# Patient Record
Sex: Female | Born: 1945
Health system: Southern US, Community
[De-identification: ages and names within clinical notes are randomized; demographics above are authoritative.]

## PROBLEM LIST (undated history)

## (undated) DIAGNOSIS — K5090 Crohn's disease, unspecified, without complications: Secondary | ICD-10-CM

## (undated) DIAGNOSIS — C4491 Basal cell carcinoma of skin, unspecified: Secondary | ICD-10-CM

## (undated) DIAGNOSIS — T148XXA Other injury of unspecified body region, initial encounter: Secondary | ICD-10-CM

## (undated) DIAGNOSIS — R229 Localized swelling, mass and lump, unspecified: Principal | ICD-10-CM

## (undated) DIAGNOSIS — I7300 Raynaud's syndrome without gangrene: Secondary | ICD-10-CM

## (undated) DIAGNOSIS — E559 Vitamin D deficiency, unspecified: Secondary | ICD-10-CM

## (undated) DIAGNOSIS — D649 Anemia, unspecified: Secondary | ICD-10-CM

## (undated) DIAGNOSIS — R42 Dizziness and giddiness: Secondary | ICD-10-CM

## (undated) DIAGNOSIS — M719 Bursopathy, unspecified: Secondary | ICD-10-CM

## (undated) DIAGNOSIS — IMO0002 Reserved for concepts with insufficient information to code with codable children: Secondary | ICD-10-CM

## (undated) DIAGNOSIS — A64 Unspecified sexually transmitted disease: Secondary | ICD-10-CM

## (undated) DIAGNOSIS — M8580 Other specified disorders of bone density and structure, unspecified site: Secondary | ICD-10-CM

## (undated) DIAGNOSIS — S4382XA Sprain of other specified parts of left shoulder girdle, initial encounter: Secondary | ICD-10-CM

## (undated) DIAGNOSIS — E041 Nontoxic single thyroid nodule: Secondary | ICD-10-CM

## (undated) DIAGNOSIS — S46812A Strain of other muscles, fascia and tendons at shoulder and upper arm level, left arm, initial encounter: Secondary | ICD-10-CM

## (undated) HISTORY — DX: Other injury of unspecified body region, initial encounter: T14.8XXA

## (undated) HISTORY — DX: Strain of other muscles, fascia and tendons at shoulder and upper arm level, left arm, initial encounter: S46.812A

## (undated) HISTORY — DX: Unspecified sexually transmitted disease: A64

## (undated) HISTORY — DX: Basal cell carcinoma of skin, unspecified: C44.91

## (undated) HISTORY — DX: Vitamin D deficiency, unspecified: E55.9

## (undated) HISTORY — DX: Reserved for concepts with insufficient information to code with codable children: IMO0002

## (undated) HISTORY — DX: Localized swelling, mass and lump, unspecified: R22.9

## (undated) HISTORY — DX: Dizziness and giddiness: R42

## (undated) HISTORY — DX: Sprain of other specified parts of left shoulder girdle, initial encounter: S43.82XA

## (undated) HISTORY — DX: Nontoxic single thyroid nodule: E04.1

## (undated) HISTORY — DX: Anemia, unspecified: D64.9

## (undated) HISTORY — DX: Other specified disorders of bone density and structure, unspecified site: M85.80

## (undated) HISTORY — DX: Crohn's disease, unspecified, without complications: K50.90

## (undated) HISTORY — DX: Bursopathy, unspecified: M71.9

## (undated) HISTORY — PX: BREAST SURGERY: SHX581

---

## 1999-12-17 ENCOUNTER — Encounter: Admission: RE | Admit: 1999-12-17 | Discharge: 1999-12-17 | Payer: Self-pay | Admitting: General Surgery

## 2000-02-06 ENCOUNTER — Encounter: Admission: RE | Admit: 2000-02-06 | Discharge: 2000-02-06 | Payer: Self-pay | Admitting: Family Medicine

## 2000-08-26 ENCOUNTER — Other Ambulatory Visit: Admission: RE | Admit: 2000-08-26 | Discharge: 2000-08-26 | Payer: Self-pay | Admitting: Obstetrics and Gynecology

## 2001-09-07 ENCOUNTER — Other Ambulatory Visit: Admission: RE | Admit: 2001-09-07 | Discharge: 2001-09-07 | Payer: Self-pay | Admitting: Obstetrics and Gynecology

## 2002-12-28 ENCOUNTER — Other Ambulatory Visit: Admission: RE | Admit: 2002-12-28 | Discharge: 2002-12-28 | Payer: Self-pay | Admitting: Obstetrics and Gynecology

## 2003-01-05 ENCOUNTER — Ambulatory Visit (HOSPITAL_COMMUNITY): Admission: RE | Admit: 2003-01-05 | Discharge: 2003-01-05 | Payer: Self-pay | Admitting: *Deleted

## 2003-01-05 ENCOUNTER — Encounter (INDEPENDENT_AMBULATORY_CARE_PROVIDER_SITE_OTHER): Payer: Self-pay

## 2004-01-11 ENCOUNTER — Other Ambulatory Visit: Admission: RE | Admit: 2004-01-11 | Discharge: 2004-01-11 | Payer: Self-pay | Admitting: Obstetrics and Gynecology

## 2006-05-27 ENCOUNTER — Encounter (INDEPENDENT_AMBULATORY_CARE_PROVIDER_SITE_OTHER): Payer: Self-pay | Admitting: *Deleted

## 2006-05-27 ENCOUNTER — Ambulatory Visit (HOSPITAL_COMMUNITY): Admission: RE | Admit: 2006-05-27 | Discharge: 2006-05-27 | Payer: Self-pay | Admitting: *Deleted

## 2008-10-25 ENCOUNTER — Encounter (HOSPITAL_COMMUNITY): Admission: RE | Admit: 2008-10-25 | Discharge: 2009-01-23 | Payer: Self-pay | Admitting: Gastroenterology

## 2009-03-07 ENCOUNTER — Encounter (HOSPITAL_COMMUNITY): Admission: RE | Admit: 2009-03-07 | Discharge: 2009-05-08 | Payer: Self-pay | Admitting: *Deleted

## 2009-03-21 ENCOUNTER — Encounter: Admission: RE | Admit: 2009-03-21 | Discharge: 2009-03-21 | Payer: Self-pay | Admitting: Family Medicine

## 2009-05-01 ENCOUNTER — Encounter: Admission: RE | Admit: 2009-05-01 | Discharge: 2009-05-01 | Payer: Self-pay

## 2009-05-09 ENCOUNTER — Encounter (HOSPITAL_COMMUNITY): Admission: RE | Admit: 2009-05-09 | Discharge: 2009-08-07 | Payer: Self-pay | Admitting: Gastroenterology

## 2009-06-01 ENCOUNTER — Encounter: Admission: RE | Admit: 2009-06-01 | Discharge: 2009-06-01 | Payer: Self-pay | Admitting: Internal Medicine

## 2009-06-14 ENCOUNTER — Other Ambulatory Visit: Admission: RE | Admit: 2009-06-14 | Discharge: 2009-06-14 | Payer: Self-pay | Admitting: Interventional Radiology

## 2009-06-14 ENCOUNTER — Encounter: Admission: RE | Admit: 2009-06-14 | Discharge: 2009-06-14 | Payer: Self-pay | Admitting: Internal Medicine

## 2009-09-26 ENCOUNTER — Encounter (HOSPITAL_COMMUNITY): Admission: RE | Admit: 2009-09-26 | Discharge: 2009-11-28 | Payer: Self-pay | Admitting: Gastroenterology

## 2009-12-05 ENCOUNTER — Encounter (HOSPITAL_COMMUNITY)
Admission: RE | Admit: 2009-12-05 | Discharge: 2010-02-27 | Payer: Self-pay | Source: Home / Self Care | Attending: Gastroenterology | Admitting: Gastroenterology

## 2010-02-18 ENCOUNTER — Encounter: Payer: Self-pay | Admitting: Family Medicine

## 2010-04-26 ENCOUNTER — Encounter (HOSPITAL_COMMUNITY): Payer: BC Managed Care – PPO | Attending: Gastroenterology

## 2010-04-26 DIAGNOSIS — K5090 Crohn's disease, unspecified, without complications: Secondary | ICD-10-CM | POA: Insufficient documentation

## 2010-04-30 ENCOUNTER — Encounter (HOSPITAL_COMMUNITY): Payer: Self-pay

## 2010-05-21 ENCOUNTER — Other Ambulatory Visit: Payer: Self-pay | Admitting: Internal Medicine

## 2010-05-21 DIAGNOSIS — E049 Nontoxic goiter, unspecified: Secondary | ICD-10-CM

## 2010-05-22 ENCOUNTER — Other Ambulatory Visit: Payer: Self-pay | Admitting: Internal Medicine

## 2010-05-22 DIAGNOSIS — E041 Nontoxic single thyroid nodule: Secondary | ICD-10-CM

## 2010-06-08 ENCOUNTER — Other Ambulatory Visit (HOSPITAL_COMMUNITY): Payer: BC Managed Care – PPO

## 2010-06-08 ENCOUNTER — Ambulatory Visit
Admission: RE | Admit: 2010-06-08 | Discharge: 2010-06-08 | Disposition: A | Discharge status: Home / Self Care | Payer: BC Managed Care – PPO | Source: Ambulatory Visit | Attending: Internal Medicine | Admitting: Internal Medicine

## 2010-06-08 DIAGNOSIS — E041 Nontoxic single thyroid nodule: Secondary | ICD-10-CM

## 2010-06-15 NOTE — Op Note (Signed)
NAME:  Cassandra Herring, Cassandra Herring                    ACCOUNT NO.:  [id number]   MEDICAL RECORD NO.:  [id number]                   PATIENT TYPE:  AMB   LOCATION:  ENDO                                 FACILITY:  [facility]   PHYSICIAN:  Waverly Ferrari, M.D.                 DATE OF BIRTH:  11/25/1945   DATE OF PROCEDURE:  DATE OF DISCHARGE:                                 OPERATIVE REPORT   PROCEDURE:  Colonoscopy with biopsy.   INDICATIONS FOR PROCEDURE:  Rectal bleeding.   ANESTHESIA:  Demerol 100,Versed 8 mg.   DESCRIPTION OF PROCEDURE:  With the patient mildly sedated in the left  lateral decubitus position, the Olympus videoscopic colonoscope was inserted  in the rectum and passed under direct vision to the cecum identified by the  ileocecal valve and appendiceal orifice.  We entered into the terminal ileum  which appeared normal but was biopsied.  From this point, the colonoscope  was then slowly withdrawn taking circumferential views of the colonic mucosa  stopping only to take random biopsies along the way until we reached the  rectum which appeared normal in direct and retroflexed view.  The endoscope  was straightened and withdrawn.  The patient's vital signs and pulse  oximeter remained stable. The patient tolerated the procedure well without  apparent complications.   FINDINGS:  Shortened chronically inflamed ahaustral colon without active  inflammation seen currently although the mucosa did appear somewhat  edematous and dull in appearance.   PLAN:  Await biopsy results.  The patient will call me for results and  followup with me in as an outpatient.                                               Waverly Ferrari, M.D.    GMO/MEDQ  D:  01/05/2003  T:  01/05/2003  Job:  [id number]

## 2010-06-15 NOTE — Op Note (Signed)
NAMEGREENLEY, MARTONE          ACCOUNT NO.:  [id number]   MEDICAL RECORD NO.:  [id number]          PATIENT TYPE:  AMB   LOCATION:  ENDO                         FACILITY:  [place]   PHYSICIAN:  Waverly Ferrari, M.D.    DATE OF BIRTH:  [birth year]   DATE OF PROCEDURE:  05/27/2006  DATE OF DISCHARGE:                               OPERATIVE REPORT   PROCEDURE:  Colonoscopy.   INDICATIONS:  Colitis with rectal bleeding.   ANESTHESIA:  Fentanyl 75 mcg, Versed 7 mg.   PROCEDURE:  With the patient mildly sedated in the left lateral  decubitus position the Pentax videoscopic pediatric colonoscope was  inserted in the rectum and passed under direct vision to cecum  identified by ileocecal valve and appendiceal orifice both which were  photographed.  We were able to enter into the terminal ileum but could  not advance past just the opening due to the edema of surrounding  colonic wall so we elected only to take biopsies from the colon as we  withdrew taking circumferential views of colonic mucosa stopping to take  random biopsies along the way of what appeared to be a pancolitis as we  withdrew all the way to the rectum which showed colitic changes as well  on direct and retroflexed view.  The endoscope was straightened and  withdrawn.  The patient's vital signs, pulse oximeter remained stable.  The patient tolerated the procedure well without apparent complications.   FINDINGS:  Changes of diffuse colitis, in fact pancolitis with biopsies  taken.   PLAN:  Await biopsy report.  Will get stools for O&P and Clostridium  difficile along with culture and sensitivity and plan on if these are  negative treating for inflammatory bowel disease exacerbation.           ______________________________  Waverly Ferrari, M.D.     GMO/MEDQ  D:  05/27/2006  T:  05/27/2006  Job:  [id number]   cc:   Juanita Craver, M.D.

## 2010-06-27 ENCOUNTER — Encounter (HOSPITAL_COMMUNITY): Payer: BC Managed Care – PPO | Attending: Gastroenterology

## 2010-06-27 DIAGNOSIS — K5090 Crohn's disease, unspecified, without complications: Secondary | ICD-10-CM | POA: Insufficient documentation

## 2010-09-05 ENCOUNTER — Encounter (HOSPITAL_COMMUNITY): Payer: BC Managed Care – PPO | Attending: Gastroenterology

## 2010-09-05 DIAGNOSIS — K5090 Crohn's disease, unspecified, without complications: Secondary | ICD-10-CM | POA: Insufficient documentation

## 2010-11-07 ENCOUNTER — Encounter (HOSPITAL_COMMUNITY)
Admission: RE | Admit: 2010-11-07 | Discharge: 2010-11-07 | Disposition: A | Discharge status: Home / Self Care | Payer: BC Managed Care – PPO | Source: Ambulatory Visit | Attending: Gastroenterology | Admitting: Gastroenterology

## 2010-11-07 DIAGNOSIS — K5090 Crohn's disease, unspecified, without complications: Secondary | ICD-10-CM | POA: Insufficient documentation

## 2011-01-08 ENCOUNTER — Other Ambulatory Visit (HOSPITAL_COMMUNITY): Payer: Self-pay | Admitting: *Deleted

## 2011-01-09 ENCOUNTER — Encounter (HOSPITAL_COMMUNITY)
Admission: RE | Admit: 2011-01-09 | Discharge: 2011-01-09 | Disposition: A | Discharge status: Home / Self Care | Payer: BC Managed Care – PPO | Source: Ambulatory Visit | Attending: Gastroenterology | Admitting: Gastroenterology

## 2011-01-09 ENCOUNTER — Other Ambulatory Visit (HOSPITAL_COMMUNITY): Payer: Self-pay | Admitting: *Deleted

## 2011-01-09 DIAGNOSIS — K5090 Crohn's disease, unspecified, without complications: Secondary | ICD-10-CM | POA: Insufficient documentation

## 2011-01-09 MED ORDER — DIPHENHYDRAMINE HCL 25 MG PO TABS
50.0000 mg | ORAL_TABLET | ORAL | Status: DC
  Administered 2011-01-09: 50 mg via ORAL
  Filled 2011-01-09 (×2): qty 2

## 2011-01-09 MED ORDER — METHYLPREDNISOLONE SODIUM SUCC 125 MG IJ SOLR
40.0000 mg | INTRAMUSCULAR | Status: DC
  Administered 2011-01-09: 40 mg via INTRAVENOUS
  Filled 2011-01-09: qty 2

## 2011-01-09 MED ORDER — SODIUM CHLORIDE 0.9 % IV SOLN
200.0000 mg | INTRAVENOUS | Status: DC
  Administered 2011-01-09: 200 mg via INTRAVENOUS
  Filled 2011-01-09: qty 20

## 2011-03-12 ENCOUNTER — Other Ambulatory Visit (HOSPITAL_COMMUNITY): Payer: Self-pay | Admitting: *Deleted

## 2011-03-13 ENCOUNTER — Encounter (HOSPITAL_COMMUNITY)
Admission: RE | Admit: 2011-03-13 | Discharge: 2011-03-13 | Disposition: A | Discharge status: Home / Self Care | Payer: BC Managed Care – PPO | Source: Ambulatory Visit | Attending: Gastroenterology | Admitting: Gastroenterology

## 2011-03-13 DIAGNOSIS — K5090 Crohn's disease, unspecified, without complications: Secondary | ICD-10-CM | POA: Insufficient documentation

## 2011-03-13 MED ORDER — SODIUM CHLORIDE 0.9 % IV SOLN
200.0000 mg | INTRAVENOUS | Status: DC
  Administered 2011-03-13: 200 mg via INTRAVENOUS
  Filled 2011-03-13: qty 20

## 2011-03-13 MED ORDER — METHYLPREDNISOLONE SODIUM SUCC 40 MG IJ SOLR
INTRAMUSCULAR | Status: AC
  Filled 2011-03-13: qty 1

## 2011-03-13 MED ORDER — DIPHENHYDRAMINE HCL 50 MG/ML IJ SOLN
25.0000 mg | INTRAMUSCULAR | Status: DC
  Administered 2011-03-13: 25 mg via INTRAVENOUS
  Filled 2011-03-13: qty 1

## 2011-03-13 MED ORDER — METHYLPREDNISOLONE SODIUM SUCC 125 MG IJ SOLR
40.0000 mg | INTRAMUSCULAR | Status: DC
  Administered 2011-03-13: 40 mg via INTRAVENOUS

## 2011-04-11 ENCOUNTER — Other Ambulatory Visit: Payer: Self-pay | Admitting: Dermatology

## 2011-05-13 ENCOUNTER — Other Ambulatory Visit (HOSPITAL_COMMUNITY): Payer: Self-pay | Admitting: *Deleted

## 2011-05-15 ENCOUNTER — Encounter (HOSPITAL_COMMUNITY)
Admission: RE | Admit: 2011-05-15 | Discharge: 2011-05-15 | Disposition: A | Discharge status: Home / Self Care | Payer: BC Managed Care – PPO | Source: Ambulatory Visit | Attending: Gastroenterology | Admitting: Gastroenterology

## 2011-05-15 DIAGNOSIS — K5090 Crohn's disease, unspecified, without complications: Secondary | ICD-10-CM | POA: Insufficient documentation

## 2011-05-15 MED ORDER — SODIUM CHLORIDE 0.9 % IV SOLN
200.0000 mg | Freq: Once | INTRAVENOUS | Status: AC
  Administered 2011-05-15: 200 mg via INTRAVENOUS
  Filled 2011-05-15: qty 20

## 2011-05-15 MED ORDER — SODIUM CHLORIDE 0.9 % IV SOLN
Freq: Once | INTRAVENOUS | Status: AC
  Administered 2011-05-15: 10:00:00 via INTRAVENOUS

## 2011-05-15 MED ORDER — DIPHENHYDRAMINE HCL 50 MG/ML IJ SOLN
25.0000 mg | Freq: Once | INTRAMUSCULAR | Status: AC
  Administered 2011-05-15: 25 mg via INTRAVENOUS
  Filled 2011-05-15: qty 1

## 2011-05-15 MED ORDER — METHYLPREDNISOLONE SODIUM SUCC 40 MG IJ SOLR
40.0000 mg | Freq: Once | INTRAMUSCULAR | Status: AC
  Administered 2011-05-15: 40 mg via INTRAVENOUS
  Filled 2011-05-15: qty 1

## 2011-05-15 NOTE — Progress Notes (Signed)
Pt requested to have IV TEAM to start her IV.

## 2011-07-12 ENCOUNTER — Other Ambulatory Visit (HOSPITAL_COMMUNITY): Payer: Self-pay | Admitting: *Deleted

## 2011-07-18 ENCOUNTER — Encounter (HOSPITAL_COMMUNITY)
Admission: RE | Admit: 2011-07-18 | Discharge: 2011-07-18 | Disposition: A | Discharge status: Home / Self Care | Payer: BC Managed Care – PPO | Source: Ambulatory Visit | Attending: Gastroenterology | Admitting: Gastroenterology

## 2011-07-18 DIAGNOSIS — K5090 Crohn's disease, unspecified, without complications: Secondary | ICD-10-CM | POA: Insufficient documentation

## 2011-07-18 MED ORDER — SODIUM CHLORIDE 0.9 % IV SOLN
INTRAVENOUS | Status: DC
  Administered 2011-07-18: 11:00:00 via INTRAVENOUS

## 2011-07-18 MED ORDER — SODIUM CHLORIDE 0.9 % IV SOLN
200.0000 mg | INTRAVENOUS | Status: DC
  Administered 2011-07-18: 200 mg via INTRAVENOUS
  Filled 2011-07-18: qty 20

## 2011-07-18 MED ORDER — DIPHENHYDRAMINE HCL 50 MG/ML IJ SOLN
25.0000 mg | INTRAMUSCULAR | Status: DC
  Administered 2011-07-18: 25 mg via INTRAVENOUS
  Filled 2011-07-18: qty 1

## 2011-07-18 MED ORDER — METHYLPREDNISOLONE SODIUM SUCC 125 MG IJ SOLR
40.0000 mg | INTRAMUSCULAR | Status: DC
  Administered 2011-07-18: 40 mg via INTRAVENOUS
  Filled 2011-07-18: qty 2

## 2011-09-18 ENCOUNTER — Other Ambulatory Visit (HOSPITAL_COMMUNITY): Payer: Self-pay | Admitting: *Deleted

## 2011-09-18 MED ORDER — METHYLPREDNISOLONE SODIUM SUCC 125 MG IJ SOLR: 40.0000 mg | INTRAMUSCULAR | Status: DC

## 2011-09-18 MED ORDER — SODIUM CHLORIDE 0.9 % IV SOLN: 200.0000 mg | INTRAVENOUS | Status: DC

## 2011-09-18 MED ORDER — SODIUM CHLORIDE 0.9 % IV SOLN: INTRAVENOUS | Status: DC

## 2011-09-18 MED ORDER — DIPHENHYDRAMINE HCL 50 MG/ML IJ SOLN: 25.0000 mg | INTRAMUSCULAR | Status: DC

## 2011-09-19 ENCOUNTER — Encounter (HOSPITAL_COMMUNITY)
Admission: RE | Admit: 2011-09-19 | Discharge: 2011-09-19 | Disposition: A | Discharge status: Home / Self Care | Payer: BC Managed Care – PPO | Source: Ambulatory Visit | Attending: Gastroenterology | Admitting: Gastroenterology

## 2011-09-19 DIAGNOSIS — K5090 Crohn's disease, unspecified, without complications: Secondary | ICD-10-CM | POA: Insufficient documentation

## 2011-09-19 MED ORDER — SODIUM CHLORIDE 0.9 % IV SOLN
INTRAVENOUS | Status: DC
  Administered 2011-09-19: 11:00:00 via INTRAVENOUS

## 2011-09-19 MED ORDER — DIPHENHYDRAMINE HCL 50 MG/ML IJ SOLN
50.0000 mg | INTRAMUSCULAR | Status: DC
  Administered 2011-09-19: 50 mg via INTRAVENOUS
  Filled 2011-09-19: qty 1

## 2011-09-19 MED ORDER — SODIUM CHLORIDE 0.9 % IV SOLN
200.0000 mg | INTRAVENOUS | Status: DC
  Administered 2011-09-19: 200 mg via INTRAVENOUS
  Filled 2011-09-19: qty 20

## 2011-09-19 MED ORDER — METHYLPREDNISOLONE SODIUM SUCC 125 MG IJ SOLR
40.0000 mg | INTRAMUSCULAR | Status: DC
  Administered 2011-09-19: 40 mg via INTRAVENOUS
  Filled 2011-09-19: qty 2

## 2011-09-23 ENCOUNTER — Other Ambulatory Visit: Payer: Self-pay | Admitting: Family Medicine

## 2011-09-23 DIAGNOSIS — M533 Sacrococcygeal disorders, not elsewhere classified: Secondary | ICD-10-CM

## 2011-09-24 ENCOUNTER — Ambulatory Visit
Admission: RE | Admit: 2011-09-24 | Discharge: 2011-09-24 | Disposition: A | Discharge status: Home / Self Care | Payer: BC Managed Care – PPO | Source: Ambulatory Visit | Attending: Family Medicine | Admitting: Family Medicine

## 2011-09-24 DIAGNOSIS — M533 Sacrococcygeal disorders, not elsewhere classified: Secondary | ICD-10-CM

## 2011-09-24 MED ORDER — IOHEXOL 300 MG/ML  SOLN
100.0000 mL | Freq: Once | INTRAMUSCULAR | Status: AC | PRN
  Administered 2011-09-24: 100 mL via INTRAVENOUS

## 2011-09-26 ENCOUNTER — Encounter (INDEPENDENT_AMBULATORY_CARE_PROVIDER_SITE_OTHER): Payer: Self-pay

## 2011-09-27 ENCOUNTER — Encounter (INDEPENDENT_AMBULATORY_CARE_PROVIDER_SITE_OTHER): Payer: Self-pay | Admitting: Surgery

## 2011-09-27 ENCOUNTER — Ambulatory Visit (INDEPENDENT_AMBULATORY_CARE_PROVIDER_SITE_OTHER): Payer: BC Managed Care – PPO | Admitting: Surgery

## 2011-09-27 VITALS — BP 128/60 | HR 66 | Temp 96.8°F | Resp 14 | Ht 61.0 in | Wt 109.2 lb

## 2011-09-27 DIAGNOSIS — IMO0002 Reserved for concepts with insufficient information to code with codable children: Secondary | ICD-10-CM | POA: Insufficient documentation

## 2011-09-27 DIAGNOSIS — R229 Localized swelling, mass and lump, unspecified: Secondary | ICD-10-CM

## 2011-09-27 NOTE — Patient Instructions (Signed)
Lipoma A lipoma is a noncancerous (benign) tumor composed of fat cells. They are usually found under the skin (subcutaneous). A lipoma may occur in any tissue of the body that contains fat. Common areas for lipomas to appear include the back, shoulders, buttocks, and thighs. Lipomas are a very common soft tissue growth. They are soft and grow slowly. Most problems caused by a lipoma depend on where it is growing. DIAGNOSIS  A lipoma can be diagnosed with a physical exam. These tumors rarely become cancerous, but radiographic studies can help determine this for certain. Studies used may include:  Computerized X-ray scans (CT or CAT scan).   Computerized magnetic scans (MRI).  TREATMENT  Small lipomas that are not causing problems may be watched. If a lipoma continues to enlarge or causes problems, removal is often the best treatment. Lipomas can also be removed to improve appearance. Surgery is done to remove the fatty cells and the surrounding capsule. Most often, this is done with medicine that numbs the area (local anesthetic). The removed tissue is examined under a microscope to make sure it is not cancerous. Keep all follow-up appointments with your caregiver. SEEK MEDICAL CARE IF:   The lipoma becomes larger or hard.   The lipoma becomes painful, red, or increasingly swollen. These could be signs of infection or a more serious condition.  Document Released: 01/04/2002 Document Revised: 01/03/2011 Document Reviewed: 06/16/2009 [facility] Patient Information 2012 [place].

## 2011-09-27 NOTE — Progress Notes (Signed)
Patient ID: Cassandra Herring, female   DOB: 09/27/1945, 66 y.o.   MRN: [id number]  Chief Complaint  Patient presents with  . Mass    Gluteal, possible rectal fistula    HPI Cassandra Herring is a 66 y.o. female.  Patient sent at the request of Dr. Leonides Schanz do to left block pain and mass. 3 months ago she developed left buttock pain. Pain made worse by sitting. There is no redness or drainage. Now she has a long just to the left upper coccyx. It is not red or draining. Is not rapidly enlarging. She has a history of Crohn's and takes Remicade. Her Crohn's has not been active recently. Denies any abdominal pain, nausea, diarrhea or vomiting. Denies any rectal drainage. HPI  Past Medical History  Diagnosis Date  . Crohn disease   . Osteopenia   . Vertigo   . Vitamin d deficiency   . Anemia   . HNP (herniated nucleus pulposus)   . Basal cell carcinoma   . Thyroid nodule   . Mass     gluteal    Past Surgical History  Procedure Date  . Breast surgery     implants-1981, removal-2003    Family History  Problem Relation Age of Onset  . Skin cancer Father   . Lymphoma Father     non-hodkins  . Heart attack Father   . Cancer Father     skin  . Kidney disease Mother   . Heart attack Mother   . Emphysema Mother   . Cancer Paternal Aunt     breast    Social History History  Substance Use Topics  . Smoking status: Never Smoker   . Smokeless tobacco: Never Used  . Alcohol Use: No    Allergies  Allergen Reactions  . Humira (Adalimumab) Rash    Raised persistent rash around the injection site.  . Remicade (Infliximab) Hives, Rash and Other (See Comments)    Tightening of chest. Per patient pre treatment requires steroids, benadryl and a low dose of remicade.    Current Outpatient Prescriptions  Medication Sig Dispense Refill  . Ascorbic Acid (VITAMIN C) 100 MG tablet Take 100 mg by mouth daily.      . Calcium Carbonate (CALCIUM 500 PO) Take by mouth.      .  Cholecalciferol (VITAMIN D) 2000 UNITS tablet Take 2,000 Units by mouth daily.      . Cyanocobalamin (VITAMIN B 12) 100 MCG LOZG Take by mouth.      Marland Kitchen DHEA 50 MG TABS Take by mouth.      . diphenhydrAMINE (BENADRYL) 50 MG tablet Take 50 mg by mouth at bedtime as needed.      Marland Kitchen estradiol (ESTRACE) 2 MG tablet Take 2 mg by mouth daily. Compounded with progesterone (oral) 50 mg. Per patient as of 09/27/11.      Marland Kitchen ESTRADIOL PO Take 10 mg by mouth daily.       . ferrous fumarate (HEMOCYTE - 106 MG FE) 325 (106 FE) MG TABS Take 1 tablet by mouth.      . fluticasone (CUTIVATE) 0.05 % cream Apply topically 2 (two) times daily.      Marland Kitchen inFLIXimab (REMICADE) 100 MG injection Inject 200 mg into the vein.       Marland Kitchen Ketoconazole 2 % GEL Apply topically.      . Magnesium 300 MG CAPS Take by mouth.      . methylPREDNISolone sodium succinate (SOLU-MEDROL) 40 mg/mL  injection Inject 50 mg into the vein once.       Marland Kitchen omega-3 acid ethyl esters (LOVAZA) 1 G capsule Take 2 g by mouth 2 (two) times daily.      . Pyridoxine HCl (VITAMIN B-6) 250 MG tablet Take 250 mg by mouth daily.        Review of Systems Review of Systems  Constitutional: Negative for fever, chills and unexpected weight change.  HENT: Negative for hearing loss, congestion, sore throat, trouble swallowing and voice change.   Eyes: Negative for visual disturbance.  Respiratory: Negative for cough and wheezing.   Cardiovascular: Negative for chest pain, palpitations and leg swelling.  Gastrointestinal: Negative for nausea, vomiting, abdominal pain, diarrhea, constipation, blood in stool, abdominal distention and anal bleeding.  Genitourinary: Negative for hematuria, vaginal bleeding and difficulty urinating.  Musculoskeletal: Negative for arthralgias.  Skin: Negative for rash and wound.  Neurological: Negative for seizures, syncope and headaches.  Hematological: Negative for adenopathy. Does not bruise/bleed easily.  Psychiatric/Behavioral:  Negative for confusion.    Blood pressure 128/60, pulse 66, temperature 96.8 F (36 C), temperature source Temporal, resp. rate 14, height 5' 1"  (1.549 m), weight 109 lb 4 oz (49.555 kg).  Physical Exam Physical Exam  Constitutional: She is oriented to person, place, and time. She appears well-developed and well-nourished.  HENT:  Head: Normocephalic and atraumatic.  Eyes: EOM are normal. Pupils are equal, round, and reactive to light.  Genitourinary:     Musculoskeletal: Normal range of motion.  Neurological: She is alert and oriented to person, place, and time.  Skin: Skin is warm and dry.  Psychiatric: She has a normal mood and affect. Her behavior is normal. Judgment and thought content normal.    Data Reviewed CT pelvis  normal  Assessment    Mass 3 cm left block adjacent to coccyx possible lipoma versus fat necrosis. Unlikely fistula or complicating feature of Crohn's disease    Plan      with normal CT scan of pelvis and exam findings I feel this can be followed. While on Remicade, surgery would be contraindicated to remove this. I don't feel this needs to be removed at this point in time and can be followed every 3 months in my office. This feels similar to fat necrosis or lipoma. Discussed with patient.    Cassandra Priestly A. 09/27/2011, 9:56 AM

## 2011-10-10 ENCOUNTER — Ambulatory Visit (INDEPENDENT_AMBULATORY_CARE_PROVIDER_SITE_OTHER): Payer: BC Managed Care – PPO | Admitting: General Surgery

## 2011-11-19 ENCOUNTER — Other Ambulatory Visit (HOSPITAL_COMMUNITY): Payer: Self-pay | Admitting: *Deleted

## 2011-11-19 MED ORDER — DIPHENHYDRAMINE HCL 50 MG/ML IJ SOLN: 50.0000 mg | INTRAMUSCULAR | Status: DC

## 2011-11-19 MED ORDER — SODIUM CHLORIDE 0.9 % IV SOLN: INTRAVENOUS | Status: DC

## 2011-11-19 MED ORDER — SODIUM CHLORIDE 0.9 % IV SOLN: 200.0000 mg | INTRAVENOUS | Status: DC

## 2011-11-19 MED ORDER — METHYLPREDNISOLONE SODIUM SUCC 125 MG IJ SOLR: 40.0000 mg | INTRAMUSCULAR | Status: DC

## 2011-11-21 ENCOUNTER — Encounter (HOSPITAL_COMMUNITY)
Admission: RE | Admit: 2011-11-21 | Discharge: 2011-11-21 | Disposition: A | Discharge status: Home / Self Care | Payer: BC Managed Care – PPO | Source: Ambulatory Visit | Attending: Gastroenterology | Admitting: Gastroenterology

## 2011-11-21 DIAGNOSIS — K5090 Crohn's disease, unspecified, without complications: Secondary | ICD-10-CM | POA: Insufficient documentation

## 2011-11-21 MED ORDER — SODIUM CHLORIDE 0.9 % IV SOLN
INTRAVENOUS | Status: DC
  Administered 2011-11-21: 10:00:00 via INTRAVENOUS

## 2011-11-21 MED ORDER — METHYLPREDNISOLONE SODIUM SUCC 40 MG IJ SOLR
INTRAMUSCULAR | Status: AC
  Administered 2011-11-21: 40 mg via INTRAVENOUS
  Filled 2011-11-21: qty 1

## 2011-11-21 MED ORDER — METHYLPREDNISOLONE SODIUM SUCC 40 MG IJ SOLR
40.0000 mg | INTRAMUSCULAR | Status: DC
  Administered 2011-11-21: 40 mg via INTRAVENOUS

## 2011-11-21 MED ORDER — INFLIXIMAB 100 MG IV SOLR
200.0000 mg | INTRAVENOUS | Status: DC
  Administered 2011-11-21: 200 mg via INTRAVENOUS
  Filled 2011-11-21: qty 20

## 2011-11-21 MED ORDER — DIPHENHYDRAMINE HCL 25 MG PO CAPS
ORAL_CAPSULE | ORAL | Status: AC
  Administered 2011-11-21: 50 mg via ORAL
  Filled 2011-11-21: qty 2

## 2011-11-21 MED ORDER — DIPHENHYDRAMINE HCL 25 MG PO TABS
50.0000 mg | ORAL_TABLET | ORAL | Status: DC
  Administered 2011-11-21: 50 mg via ORAL
  Filled 2011-11-21: qty 2

## 2011-12-31 ENCOUNTER — Ambulatory Visit (INDEPENDENT_AMBULATORY_CARE_PROVIDER_SITE_OTHER): Payer: BC Managed Care – PPO | Admitting: Surgery

## 2011-12-31 ENCOUNTER — Other Ambulatory Visit (INDEPENDENT_AMBULATORY_CARE_PROVIDER_SITE_OTHER): Payer: Self-pay

## 2011-12-31 ENCOUNTER — Encounter (INDEPENDENT_AMBULATORY_CARE_PROVIDER_SITE_OTHER): Payer: Self-pay | Admitting: Surgery

## 2011-12-31 VITALS — BP 124/62 | HR 66 | Temp 97.6°F | Resp 18 | Ht 61.0 in | Wt 109.2 lb

## 2011-12-31 DIAGNOSIS — E7889 Other lipoprotein metabolism disorders: Secondary | ICD-10-CM

## 2011-12-31 DIAGNOSIS — M7989 Other specified soft tissue disorders: Secondary | ICD-10-CM

## 2011-12-31 NOTE — Progress Notes (Signed)
Subjective:     Patient ID: Cassandra Herring, female   DOB: Jun 27, 1945, 66 y.o.   MRN: [id number]  HPI patient returns in followup for left blocked mass felt to be fat necrosis. The patient relates a history of repetitive trauma to this area. Her sliding glass door hits this area when she opens the door to let animals out of the house. This has gone on for 20 years. She denies any drainage, redness, or increase in size of the area.   Review of Systems  Constitutional: Negative.   HENT: Negative.   Eyes: Negative.   Respiratory: Negative.   Cardiovascular: Negative.   Gastrointestinal: Negative.        Objective:   Physical Exam  Constitutional: She is oriented to person, place, and time. She appears well-developed and well-nourished.  HENT:  Head: Normocephalic and atraumatic.  Eyes: EOM are normal. Pupils are equal, round, and reactive to light.  Neck: Normal range of motion. Neck supple.  Genitourinary:     Musculoskeletal: Normal range of motion.  Neurological: She is alert and oriented to person, place, and time. She has normal reflexes.  Skin: Skin is warm and dry.       Assessment:     Fat necrosis near coccyx resolving    Plan:     Nothing else needs to be done. Return if condition worsens

## 2011-12-31 NOTE — Patient Instructions (Signed)
Avoid trauma  Return if it worsens

## 2012-01-24 ENCOUNTER — Other Ambulatory Visit (HOSPITAL_COMMUNITY): Payer: Self-pay | Admitting: *Deleted

## 2012-01-24 NOTE — Progress Notes (Signed)
Called and spoke with Marchelle Folks at Dr. Randa Evens' office to inform MD of Remicade allergy listed in computer.  MD states that he is aware and gave orders to pre-medicate prior to administration.

## 2012-01-27 ENCOUNTER — Encounter (HOSPITAL_COMMUNITY)
Admission: RE | Admit: 2012-01-27 | Discharge: 2012-01-27 | Disposition: A | Discharge status: Home / Self Care | Payer: BC Managed Care – PPO | Source: Ambulatory Visit | Attending: Gastroenterology | Admitting: Gastroenterology

## 2012-01-27 DIAGNOSIS — K5090 Crohn's disease, unspecified, without complications: Secondary | ICD-10-CM | POA: Insufficient documentation

## 2012-01-27 MED ORDER — SODIUM CHLORIDE 0.9 % IV SOLN
200.0000 mg | INTRAVENOUS | Status: DC
  Administered 2012-01-27: 200 mg via INTRAVENOUS
  Filled 2012-01-27: qty 20

## 2012-01-27 MED ORDER — DIPHENHYDRAMINE HCL 25 MG PO CAPS
ORAL_CAPSULE | ORAL | Status: AC
  Filled 2012-01-27: qty 2

## 2012-01-27 MED ORDER — DIPHENHYDRAMINE HCL 25 MG PO TABS
50.0000 mg | ORAL_TABLET | ORAL | Status: DC
  Administered 2012-01-27: 50 mg via ORAL
  Filled 2012-01-27: qty 2

## 2012-01-27 MED ORDER — SODIUM CHLORIDE 0.9 % IV SOLN
INTRAVENOUS | Status: DC
Start: 2012-01-27 — End: 2012-01-28
  Administered 2012-01-27: 09:00:00 via INTRAVENOUS

## 2012-01-27 MED ORDER — METHYLPREDNISOLONE SODIUM SUCC 125 MG IJ SOLR: 40.0000 mg | INTRAMUSCULAR | Status: DC

## 2012-01-27 MED ORDER — METHYLPREDNISOLONE SODIUM SUCC 125 MG IJ SOLR
INTRAMUSCULAR | Status: AC
  Administered 2012-01-27: 40 mg
  Filled 2012-01-27: qty 2

## 2012-03-02 DIAGNOSIS — Z111 Encounter for screening for respiratory tuberculosis: Secondary | ICD-10-CM | POA: Diagnosis not present

## 2012-03-02 DIAGNOSIS — R5381 Other malaise: Secondary | ICD-10-CM | POA: Diagnosis not present

## 2012-03-02 DIAGNOSIS — K5090 Crohn's disease, unspecified, without complications: Secondary | ICD-10-CM | POA: Diagnosis not present

## 2012-03-19 DIAGNOSIS — R5381 Other malaise: Secondary | ICD-10-CM | POA: Diagnosis not present

## 2012-03-27 ENCOUNTER — Other Ambulatory Visit (HOSPITAL_COMMUNITY): Payer: Self-pay | Admitting: *Deleted

## 2012-03-30 ENCOUNTER — Encounter (HOSPITAL_COMMUNITY)
Admission: RE | Admit: 2012-03-30 | Discharge: 2012-03-30 | Disposition: A | Discharge status: Home / Self Care | Payer: Medicare Other | Source: Ambulatory Visit | Attending: Gastroenterology | Admitting: Gastroenterology

## 2012-03-30 DIAGNOSIS — K5090 Crohn's disease, unspecified, without complications: Secondary | ICD-10-CM | POA: Diagnosis not present

## 2012-03-30 MED ORDER — METHYLPREDNISOLONE SODIUM SUCC 125 MG IJ SOLR: 40.0000 mg | INTRAMUSCULAR | Status: DC

## 2012-03-30 MED ORDER — SODIUM CHLORIDE 0.9 % IV SOLN
INTRAVENOUS | Status: AC
  Administered 2012-03-30: 11:00:00 via INTRAVENOUS

## 2012-03-30 MED ORDER — DIPHENHYDRAMINE HCL 25 MG PO CAPS
ORAL_CAPSULE | ORAL | Status: AC
  Administered 2012-03-30: 50 mg via ORAL
  Filled 2012-03-30: qty 2

## 2012-03-30 MED ORDER — METHYLPREDNISOLONE SODIUM SUCC 40 MG IJ SOLR
INTRAMUSCULAR | Status: AC
  Administered 2012-03-30: 40 mg
  Filled 2012-03-30: qty 1

## 2012-03-30 MED ORDER — DIPHENHYDRAMINE HCL 25 MG PO TABS
50.0000 mg | ORAL_TABLET | ORAL | Status: AC
  Filled 2012-03-30: qty 2

## 2012-03-30 MED ORDER — INFLIXIMAB 100 MG IV SOLR
200.0000 mg | INTRAVENOUS | Status: AC
  Administered 2012-03-30: 200 mg via INTRAVENOUS
  Filled 2012-03-30: qty 20

## 2012-04-02 DIAGNOSIS — K5090 Crohn's disease, unspecified, without complications: Secondary | ICD-10-CM | POA: Diagnosis not present

## 2012-04-12 DIAGNOSIS — N951 Menopausal and female climacteric states: Secondary | ICD-10-CM | POA: Diagnosis not present

## 2012-04-21 DIAGNOSIS — K5090 Crohn's disease, unspecified, without complications: Secondary | ICD-10-CM | POA: Diagnosis not present

## 2012-05-07 DIAGNOSIS — K5090 Crohn's disease, unspecified, without complications: Secondary | ICD-10-CM | POA: Diagnosis not present

## 2012-06-01 ENCOUNTER — Other Ambulatory Visit (HOSPITAL_COMMUNITY): Payer: Self-pay | Admitting: *Deleted

## 2012-06-02 ENCOUNTER — Encounter (HOSPITAL_COMMUNITY)
Admission: RE | Admit: 2012-06-02 | Discharge: 2012-06-02 | Disposition: A | Discharge status: Home / Self Care | Payer: Medicare Other | Source: Ambulatory Visit | Attending: Gastroenterology | Admitting: Gastroenterology

## 2012-06-02 DIAGNOSIS — K5090 Crohn's disease, unspecified, without complications: Secondary | ICD-10-CM | POA: Insufficient documentation

## 2012-06-02 MED ORDER — DIPHENHYDRAMINE HCL 25 MG PO CAPS
ORAL_CAPSULE | ORAL | Status: AC
  Administered 2012-06-02: 50 mg via ORAL
  Filled 2012-06-02: qty 2

## 2012-06-02 MED ORDER — METHYLPREDNISOLONE SODIUM SUCC 125 MG IJ SOLR: 40.0000 mg | Freq: Once | INTRAMUSCULAR | Status: AC

## 2012-06-02 MED ORDER — SODIUM CHLORIDE 0.9 % IV SOLN
200.0000 mg | INTRAVENOUS | Status: DC
  Administered 2012-06-02: 200 mg via INTRAVENOUS
  Filled 2012-06-02: qty 20

## 2012-06-02 MED ORDER — SODIUM CHLORIDE 0.9 % IV SOLN
INTRAVENOUS | Status: DC
  Administered 2012-06-02: 250 mL via INTRAVENOUS

## 2012-06-02 MED ORDER — DIPHENHYDRAMINE HCL 25 MG PO TABS
50.0000 mg | ORAL_TABLET | Freq: Once | ORAL | Status: AC
  Filled 2012-06-02: qty 2

## 2012-06-02 MED ORDER — METHYLPREDNISOLONE SODIUM SUCC 125 MG IJ SOLR
INTRAMUSCULAR | Status: AC
  Administered 2012-06-02: 40 mg via INTRAVENOUS
  Filled 2012-06-02: qty 2

## 2012-06-04 DIAGNOSIS — K5090 Crohn's disease, unspecified, without complications: Secondary | ICD-10-CM | POA: Diagnosis not present

## 2012-06-05 ENCOUNTER — Encounter (HOSPITAL_COMMUNITY): Payer: Medicare Other

## 2012-06-13 DIAGNOSIS — K5090 Crohn's disease, unspecified, without complications: Secondary | ICD-10-CM | POA: Diagnosis not present

## 2012-07-02 DIAGNOSIS — K5090 Crohn's disease, unspecified, without complications: Secondary | ICD-10-CM | POA: Diagnosis not present

## 2012-07-02 DIAGNOSIS — H2510 Age-related nuclear cataract, unspecified eye: Secondary | ICD-10-CM | POA: Diagnosis not present

## 2012-07-27 ENCOUNTER — Encounter: Payer: Self-pay | Admitting: Certified Nurse Midwife

## 2012-07-27 ENCOUNTER — Encounter: Payer: Self-pay | Admitting: *Deleted

## 2012-07-27 ENCOUNTER — Ambulatory Visit (INDEPENDENT_AMBULATORY_CARE_PROVIDER_SITE_OTHER): Payer: Medicare Other | Admitting: Certified Nurse Midwife

## 2012-07-27 VITALS — BP 104/62 | HR 60 | Resp 16 | Ht 61.25 in | Wt 108.0 lb

## 2012-07-27 DIAGNOSIS — N368 Other specified disorders of urethra: Secondary | ICD-10-CM

## 2012-07-28 ENCOUNTER — Encounter: Payer: Self-pay | Admitting: Certified Nurse Midwife

## 2012-07-28 NOTE — Progress Notes (Signed)
67 yo white married female go po here with complaint of something protruding in vaginal opening. Patient found this while sitting in tub bathing. Denies pain, vaginal bleeding, vaginal dryness, urinary frequency, urgency, or vaginal discharge." I felt something in vagina and looked with mirror and could not see the vaginal opening any more" Patient also denies incontinence with bladder or bowel. Patient is currently on compounded HRT with Dr. Comer Locket.  O: Healthy female, WD,WN no apparent distress Affect: anxious, orientation x 3  Exam: Skin: warm and dry Abdomen: soft, non tender, no masses palpated Pelvic: External genital area: normal atrophic appearance, no lesions Vagina: moist, normal appearance Urethra/Urethral meatus: mild prolapse noted, meatus prominent(patient shown area in mirror and noted that is what she saw,"thought my cervix had prolapsed") Area also checked with patient squatting, no change in appearance Bladder: normal, non tender Cervix: normal, non tender Uterus: normal, non tender Adnexa: normal, non tender, no masses Perineal area: no lesions  A:Mild urethral prolapse with prominent urethra meatus 2-Normal pelvic exam with above noted  P:Discussed finding with patient, reassured no pelvic organ prolapse. Discussed not uncommon finding in post-menopausal women and usually does not present a problem. Reminded she is asymptomatic at this point. Questions addressed.  RV prn, aex

## 2012-07-28 NOTE — Progress Notes (Signed)
Reviewed CPRomine, MD 

## 2012-08-05 ENCOUNTER — Other Ambulatory Visit (HOSPITAL_COMMUNITY): Payer: Self-pay | Admitting: *Deleted

## 2012-08-06 ENCOUNTER — Encounter (HOSPITAL_COMMUNITY)
Admission: RE | Admit: 2012-08-06 | Discharge: 2012-08-06 | Disposition: A | Discharge status: Home / Self Care | Payer: Medicare Other | Source: Ambulatory Visit | Attending: Gastroenterology | Admitting: Gastroenterology

## 2012-08-06 DIAGNOSIS — K5090 Crohn's disease, unspecified, without complications: Secondary | ICD-10-CM | POA: Insufficient documentation

## 2012-08-06 MED ORDER — DIPHENHYDRAMINE HCL 25 MG PO CAPS
ORAL_CAPSULE | ORAL | Status: AC
  Administered 2012-08-06: 50 mg via ORAL
  Filled 2012-08-06: qty 2

## 2012-08-06 MED ORDER — SODIUM CHLORIDE 0.9 % IV SOLN
200.0000 mg | INTRAVENOUS | Status: DC
  Administered 2012-08-06: 200 mg via INTRAVENOUS
  Filled 2012-08-06: qty 20

## 2012-08-06 MED ORDER — SODIUM CHLORIDE 0.9 % IV SOLN
INTRAVENOUS | Status: DC
  Administered 2012-08-06: 250 mL via INTRAVENOUS

## 2012-08-06 MED ORDER — METHYLPREDNISOLONE SODIUM SUCC 40 MG IJ SOLR: 40.0000 mg | INTRAMUSCULAR | Status: DC

## 2012-08-06 MED ORDER — DIPHENHYDRAMINE HCL 25 MG PO TABS: 50.0000 mg | ORAL_TABLET | ORAL | Status: DC

## 2012-08-06 MED ORDER — METHYLPREDNISOLONE SODIUM SUCC 40 MG IJ SOLR
INTRAMUSCULAR | Status: AC
  Administered 2012-08-06: 40 mg via INTRAVENOUS
  Filled 2012-08-06: qty 1

## 2012-08-11 DIAGNOSIS — K5090 Crohn's disease, unspecified, without complications: Secondary | ICD-10-CM | POA: Diagnosis not present

## 2012-08-14 ENCOUNTER — Telehealth: Payer: Self-pay | Admitting: Certified Nurse Midwife

## 2012-08-14 NOTE — Telephone Encounter (Signed)
Pt would like to get recommendations for a calcium supplement from [place].

## 2012-08-17 ENCOUNTER — Other Ambulatory Visit: Payer: Self-pay | Admitting: Dermatology

## 2012-08-17 DIAGNOSIS — D485 Neoplasm of uncertain behavior of skin: Secondary | ICD-10-CM | POA: Diagnosis not present

## 2012-08-17 DIAGNOSIS — L570 Actinic keratosis: Secondary | ICD-10-CM | POA: Diagnosis not present

## 2012-08-17 NOTE — Telephone Encounter (Signed)
Citra cal petite chewable one daily

## 2012-08-17 NOTE — Telephone Encounter (Signed)
Patient would like to know what calcium supplements you recommend . Used to use [employer] but stopped due to being  Linked with diarrhea. Please advise. Last AEX with you 06/19/2011 paper chart Last OV 07/27/2012 EPIC

## 2012-08-18 NOTE — Telephone Encounter (Signed)
LMTCB  aa 

## 2012-08-18 NOTE — Telephone Encounter (Signed)
Spoke with pt about DL rec: for citracal supplement. Pt agreeable and appreciative.

## 2012-09-07 ENCOUNTER — Other Ambulatory Visit: Payer: Self-pay | Admitting: Gastroenterology

## 2012-09-07 DIAGNOSIS — K50919 Crohn's disease, unspecified, with unspecified complications: Secondary | ICD-10-CM

## 2012-09-07 DIAGNOSIS — Z79890 Hormone replacement therapy (postmenopausal): Secondary | ICD-10-CM | POA: Diagnosis not present

## 2012-09-07 DIAGNOSIS — K5090 Crohn's disease, unspecified, without complications: Secondary | ICD-10-CM | POA: Diagnosis not present

## 2012-09-14 ENCOUNTER — Other Ambulatory Visit: Payer: Medicare Other

## 2012-09-17 DIAGNOSIS — K5090 Crohn's disease, unspecified, without complications: Secondary | ICD-10-CM | POA: Diagnosis not present

## 2012-09-17 DIAGNOSIS — Z79899 Other long term (current) drug therapy: Secondary | ICD-10-CM | POA: Diagnosis not present

## 2012-09-17 DIAGNOSIS — E559 Vitamin D deficiency, unspecified: Secondary | ICD-10-CM | POA: Diagnosis not present

## 2012-09-21 ENCOUNTER — Ambulatory Visit
Admission: RE | Admit: 2012-09-21 | Discharge: 2012-09-21 | Disposition: A | Discharge status: Home / Self Care | Payer: Medicare Other | Source: Ambulatory Visit | Attending: Gastroenterology | Admitting: Gastroenterology

## 2012-09-21 DIAGNOSIS — K50919 Crohn's disease, unspecified, with unspecified complications: Secondary | ICD-10-CM

## 2012-09-21 DIAGNOSIS — R109 Unspecified abdominal pain: Secondary | ICD-10-CM | POA: Diagnosis not present

## 2012-09-21 MED ORDER — IOHEXOL 300 MG/ML  SOLN
100.0000 mL | Freq: Once | INTRAMUSCULAR | Status: AC | PRN
  Administered 2012-09-21: 100 mL via INTRAVENOUS

## 2012-09-24 DIAGNOSIS — E559 Vitamin D deficiency, unspecified: Secondary | ICD-10-CM | POA: Diagnosis not present

## 2012-09-24 DIAGNOSIS — Z79899 Other long term (current) drug therapy: Secondary | ICD-10-CM | POA: Diagnosis not present

## 2012-09-24 DIAGNOSIS — K5090 Crohn's disease, unspecified, without complications: Secondary | ICD-10-CM | POA: Diagnosis not present

## 2012-09-24 DIAGNOSIS — Z23 Encounter for immunization: Secondary | ICD-10-CM | POA: Diagnosis not present

## 2012-09-24 DIAGNOSIS — Z0000 Encounter for general adult medical examination without abnormal findings: Secondary | ICD-10-CM | POA: Diagnosis not present

## 2012-09-24 DIAGNOSIS — J309 Allergic rhinitis, unspecified: Secondary | ICD-10-CM | POA: Diagnosis not present

## 2012-09-24 DIAGNOSIS — M899 Disorder of bone, unspecified: Secondary | ICD-10-CM | POA: Diagnosis not present

## 2012-10-09 ENCOUNTER — Other Ambulatory Visit (HOSPITAL_COMMUNITY): Payer: Self-pay | Admitting: *Deleted

## 2012-10-12 ENCOUNTER — Encounter (HOSPITAL_COMMUNITY)
Admission: RE | Admit: 2012-10-12 | Discharge: 2012-10-12 | Disposition: A | Discharge status: Home / Self Care | Payer: Medicare Other | Source: Ambulatory Visit | Attending: Gastroenterology | Admitting: Gastroenterology

## 2012-10-12 DIAGNOSIS — K5090 Crohn's disease, unspecified, without complications: Secondary | ICD-10-CM | POA: Insufficient documentation

## 2012-10-12 MED ORDER — DIPHENHYDRAMINE HCL 25 MG PO CAPS
ORAL_CAPSULE | ORAL | Status: AC
  Administered 2012-10-12: 50 mg
  Filled 2012-10-12: qty 2

## 2012-10-12 MED ORDER — SODIUM CHLORIDE 0.9 % IV SOLN
Freq: Once | INTRAVENOUS | Status: AC
  Administered 2012-10-12: 10:00:00 via INTRAVENOUS

## 2012-10-12 MED ORDER — DIPHENHYDRAMINE HCL 25 MG PO CAPS: 50.0000 mg | ORAL_CAPSULE | Freq: Once | ORAL | Status: DC

## 2012-10-12 MED ORDER — METHYLPREDNISOLONE SODIUM SUCC 40 MG IJ SOLR
INTRAMUSCULAR | Status: AC
  Administered 2012-10-12: 40 mg
  Filled 2012-10-12: qty 1

## 2012-10-12 MED ORDER — METHYLPREDNISOLONE SODIUM SUCC 40 MG IJ SOLR: 40.0000 mg | Freq: Once | INTRAMUSCULAR | Status: DC

## 2012-10-12 MED ORDER — SODIUM CHLORIDE 0.9 % IV SOLN
200.0000 mg | Freq: Once | INTRAVENOUS | Status: AC
  Administered 2012-10-12: 200 mg via INTRAVENOUS
  Filled 2012-10-12: qty 20

## 2012-12-17 ENCOUNTER — Encounter (HOSPITAL_COMMUNITY)
Admission: RE | Admit: 2012-12-17 | Discharge: 2012-12-17 | Disposition: A | Discharge status: Home / Self Care | Payer: Medicare Other | Source: Ambulatory Visit | Attending: Gastroenterology | Admitting: Gastroenterology

## 2012-12-17 ENCOUNTER — Other Ambulatory Visit (HOSPITAL_COMMUNITY): Payer: Self-pay | Admitting: *Deleted

## 2012-12-17 DIAGNOSIS — K5090 Crohn's disease, unspecified, without complications: Secondary | ICD-10-CM | POA: Insufficient documentation

## 2012-12-17 MED ORDER — SODIUM CHLORIDE 0.9 % IV SOLN
300.0000 mg | INTRAVENOUS | Status: DC
  Administered 2012-12-17: 300 mg via INTRAVENOUS
  Filled 2012-12-17: qty 30

## 2012-12-17 MED ORDER — METHYLPREDNISOLONE SODIUM SUCC 40 MG IJ SOLR
40.0000 mg | INTRAMUSCULAR | Status: DC
  Administered 2012-12-17: 40 mg via INTRAVENOUS

## 2012-12-17 MED ORDER — DIPHENHYDRAMINE HCL 25 MG PO TABS
50.0000 mg | ORAL_TABLET | ORAL | Status: DC
  Administered 2012-12-17: 50 mg via ORAL

## 2012-12-17 MED ORDER — METHYLPREDNISOLONE SODIUM SUCC 40 MG IJ SOLR
INTRAMUSCULAR | Status: AC
  Filled 2012-12-17: qty 1

## 2012-12-17 MED ORDER — DIPHENHYDRAMINE HCL 25 MG PO CAPS
ORAL_CAPSULE | ORAL | Status: AC
  Filled 2012-12-17: qty 2

## 2012-12-17 MED ORDER — SODIUM CHLORIDE 0.9 % IV SOLN: 200.0000 mg | INTRAVENOUS | Status: DC

## 2012-12-17 MED ORDER — SODIUM CHLORIDE 0.9 % IV SOLN
INTRAVENOUS | Status: DC
  Administered 2012-12-17: 10:00:00 via INTRAVENOUS

## 2012-12-17 MED FILL — Infliximab For IV Inj 100 MG: INTRAVENOUS | Qty: 2 | Status: AC

## 2012-12-17 MED FILL — Sodium Chloride IV Soln 0.9%: INTRAVENOUS | Qty: 250 | Status: AC

## 2012-12-17 NOTE — Progress Notes (Signed)
Patient's weigh is ~50kg and remicade dose (5mg /kg =250mg ) rounded to the nearest 100 mg per  Hospital policy should be 300mg .  The 300mg  dose was sent up to short stay.  Patient stated that she can only "tolerate" the 200mg  dose and requested that the dose be changed to this.  RN was instructed to only give 200mg  from the 300mg  bag.  Pharmacy manually charged patient for the 200mg  dose.  Dorna Leitz, PharmD, BCPS

## 2013-02-01 DIAGNOSIS — L719 Rosacea, unspecified: Secondary | ICD-10-CM | POA: Diagnosis not present

## 2013-02-01 DIAGNOSIS — Z85828 Personal history of other malignant neoplasm of skin: Secondary | ICD-10-CM | POA: Diagnosis not present

## 2013-02-01 DIAGNOSIS — D2330 Other benign neoplasm of skin of unspecified part of face: Secondary | ICD-10-CM | POA: Diagnosis not present

## 2013-02-01 DIAGNOSIS — D239 Other benign neoplasm of skin, unspecified: Secondary | ICD-10-CM | POA: Diagnosis not present

## 2013-02-01 DIAGNOSIS — L570 Actinic keratosis: Secondary | ICD-10-CM | POA: Diagnosis not present

## 2013-02-22 ENCOUNTER — Encounter (HOSPITAL_COMMUNITY)
Admission: RE | Admit: 2013-02-22 | Discharge: 2013-02-22 | Disposition: A | Discharge status: Home / Self Care | Payer: Medicare Other | Source: Ambulatory Visit | Attending: Gastroenterology | Admitting: Gastroenterology

## 2013-02-22 DIAGNOSIS — K5090 Crohn's disease, unspecified, without complications: Secondary | ICD-10-CM | POA: Diagnosis not present

## 2013-02-22 MED ORDER — DIPHENHYDRAMINE HCL 25 MG PO TABS
50.0000 mg | ORAL_TABLET | ORAL | Status: DC
  Administered 2013-02-22: 50 mg via ORAL
  Filled 2013-02-22: qty 2

## 2013-02-22 MED ORDER — SODIUM CHLORIDE 0.9 % IV SOLN
200.0000 mg | INTRAVENOUS | Status: DC
  Administered 2013-02-22: 200 mg via INTRAVENOUS
  Filled 2013-02-22: qty 20

## 2013-02-22 MED ORDER — METHYLPREDNISOLONE SODIUM SUCC 40 MG IJ SOLR
40.0000 mg | INTRAMUSCULAR | Status: DC
  Administered 2013-02-22: 40 mg via INTRAVENOUS

## 2013-02-22 MED ORDER — SODIUM CHLORIDE 0.9 % IV SOLN
INTRAVENOUS | Status: DC
  Administered 2013-02-22: 250 mL via INTRAVENOUS

## 2013-02-22 MED ORDER — METHYLPREDNISOLONE SODIUM SUCC 40 MG IJ SOLR
INTRAMUSCULAR | Status: AC
  Administered 2013-02-22: 40 mg via INTRAVENOUS
  Filled 2013-02-22: qty 1

## 2013-02-22 MED ORDER — DIPHENHYDRAMINE HCL 25 MG PO CAPS
ORAL_CAPSULE | ORAL | Status: AC
  Administered 2013-02-22: 50 mg via ORAL
  Filled 2013-02-22: qty 2

## 2013-03-25 ENCOUNTER — Encounter (HOSPITAL_COMMUNITY): Payer: Medicare Other

## 2013-04-29 ENCOUNTER — Encounter (HOSPITAL_COMMUNITY)
Admission: RE | Admit: 2013-04-29 | Discharge: 2013-04-29 | Disposition: A | Discharge status: Home / Self Care | Payer: Medicare Other | Source: Ambulatory Visit | Attending: Gastroenterology | Admitting: Gastroenterology

## 2013-04-29 DIAGNOSIS — K5090 Crohn's disease, unspecified, without complications: Secondary | ICD-10-CM | POA: Diagnosis not present

## 2013-04-29 MED ORDER — DIPHENHYDRAMINE HCL 25 MG PO TABS
50.0000 mg | ORAL_TABLET | ORAL | Status: DC
  Administered 2013-04-29: 50 mg via ORAL
  Filled 2013-04-29: qty 2

## 2013-04-29 MED ORDER — DIPHENHYDRAMINE HCL 25 MG PO CAPS
ORAL_CAPSULE | ORAL | Status: AC
  Filled 2013-04-29: qty 2

## 2013-04-29 MED ORDER — SODIUM CHLORIDE 0.9 % IV SOLN
INTRAVENOUS | Status: DC
  Administered 2013-04-29: 10:00:00 via INTRAVENOUS

## 2013-04-29 MED ORDER — SODIUM CHLORIDE 0.9 % IV SOLN
200.0000 mg | INTRAVENOUS | Status: DC
  Administered 2013-04-29: 200 mg via INTRAVENOUS
  Filled 2013-04-29: qty 20

## 2013-04-29 MED ORDER — METHYLPREDNISOLONE SODIUM SUCC 40 MG IJ SOLR
40.0000 mg | INTRAMUSCULAR | Status: DC
  Administered 2013-04-29: 40 mg via INTRAVENOUS

## 2013-04-29 MED ORDER — METHYLPREDNISOLONE SODIUM SUCC 40 MG IJ SOLR
INTRAMUSCULAR | Status: AC
  Filled 2013-04-29: qty 1

## 2013-05-26 DIAGNOSIS — L719 Rosacea, unspecified: Secondary | ICD-10-CM | POA: Diagnosis not present

## 2013-07-05 ENCOUNTER — Other Ambulatory Visit (HOSPITAL_COMMUNITY): Payer: Self-pay | Admitting: *Deleted

## 2013-07-06 ENCOUNTER — Encounter (HOSPITAL_COMMUNITY)
Admission: RE | Admit: 2013-07-06 | Discharge: 2013-07-06 | Disposition: A | Discharge status: Home / Self Care | Payer: Medicare Other | Source: Ambulatory Visit | Attending: Gastroenterology | Admitting: Gastroenterology

## 2013-07-06 DIAGNOSIS — K5090 Crohn's disease, unspecified, without complications: Secondary | ICD-10-CM | POA: Diagnosis not present

## 2013-07-06 MED ORDER — INFLIXIMAB 100 MG IV SOLR
200.0000 mg | INTRAVENOUS | Status: DC
  Administered 2013-07-06: 200 mg via INTRAVENOUS
  Filled 2013-07-06: qty 20

## 2013-07-06 MED ORDER — DIPHENHYDRAMINE HCL 25 MG PO TABS
50.0000 mg | ORAL_TABLET | ORAL | Status: DC
  Administered 2013-07-06: 50 mg via ORAL
  Filled 2013-07-06: qty 2

## 2013-07-06 MED ORDER — DIPHENHYDRAMINE HCL 25 MG PO CAPS
ORAL_CAPSULE | ORAL | Status: AC
  Administered 2013-07-06: 50 mg via ORAL
  Filled 2013-07-06: qty 2

## 2013-07-06 MED ORDER — SODIUM CHLORIDE 0.9 % IV SOLN: INTRAVENOUS | Status: DC

## 2013-09-08 ENCOUNTER — Other Ambulatory Visit (HOSPITAL_COMMUNITY): Payer: Self-pay | Admitting: *Deleted

## 2013-09-09 ENCOUNTER — Encounter (HOSPITAL_COMMUNITY)
Admission: RE | Admit: 2013-09-09 | Discharge: 2013-09-09 | Disposition: A | Discharge status: Home / Self Care | Payer: Medicare Other | Source: Ambulatory Visit | Attending: Gastroenterology | Admitting: Gastroenterology

## 2013-09-09 DIAGNOSIS — K5090 Crohn's disease, unspecified, without complications: Secondary | ICD-10-CM | POA: Diagnosis not present

## 2013-09-09 MED ORDER — INFLIXIMAB 100 MG IV SOLR
200.0000 mg | INTRAVENOUS | Status: DC
  Administered 2013-09-09: 200 mg via INTRAVENOUS
  Filled 2013-09-09: qty 20

## 2013-09-09 MED ORDER — DIPHENHYDRAMINE HCL 25 MG PO CAPS: 50.0000 mg | ORAL_CAPSULE | ORAL | Status: DC

## 2013-09-09 MED ORDER — DIPHENHYDRAMINE HCL 25 MG PO CAPS
ORAL_CAPSULE | ORAL | Status: AC
  Administered 2013-09-09: 50 mg
  Filled 2013-09-09: qty 2

## 2013-09-09 MED ORDER — DIPHENHYDRAMINE HCL 25 MG PO TABS: 50.0000 mg | ORAL_TABLET | ORAL | Status: DC

## 2013-09-09 MED ORDER — SODIUM CHLORIDE 0.9 % IV SOLN
INTRAVENOUS | Status: DC
  Administered 2013-09-09: 10:00:00 via INTRAVENOUS

## 2013-09-11 IMAGING — CT CT ENTEROGRAPHY (ABD-PELV W/ CM)
2 of 6 series · 12 of 36 positions shown, 19 images · IV contrast (VOLUMEN & [ID] OMNI 300)
Comparison: 09/24/2011

CLINICAL DATA: Crohn's disease diagnosed in 1889. Lower abdominal
pain for the past year.

EXAM:
CT ABDOMEN AND PELVIS WITH CONTRAST (ENTEROGRAPHY)
TECHNIQUE: Multidetector CT imaging of the abdomen and pelvis was performed
using the standard protocol following bolus administration of
intravenous contrast.
CONTRAST:  100mL OMNIPAQUE IOHEXOL 300 MG/ML  SOLN

[Series 3: enterography (id) · axial · 0.74mm/px · z∈[-396,-6]mm · 11 of 175 slices shown, 17 images]
[im 15/175  soft-tissue]
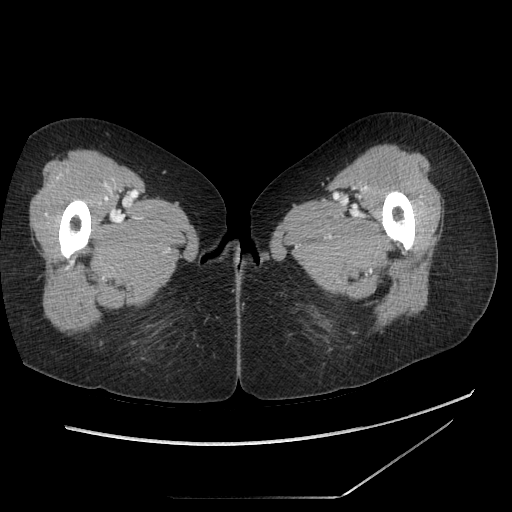
[im 15/175  bone]
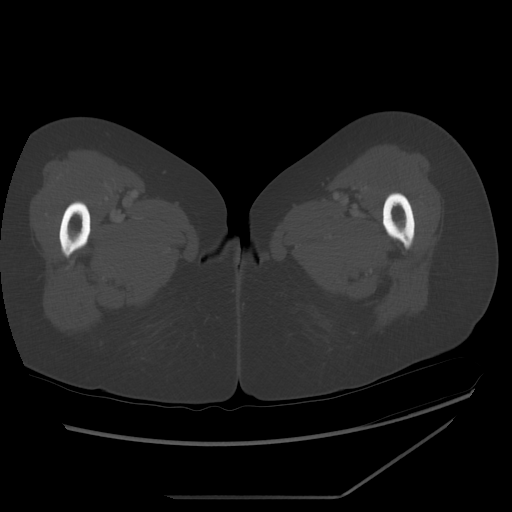
[im 30/175  soft-tissue]
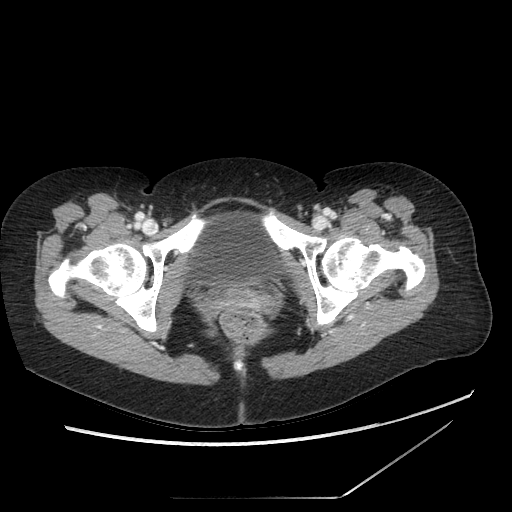
[im 44/175  soft-tissue]
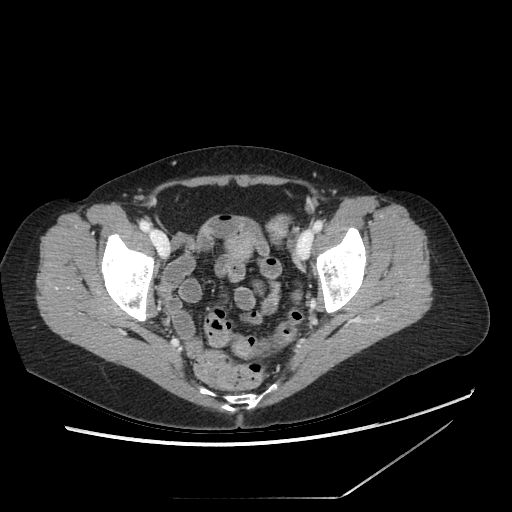
[im 59/175  soft-tissue]
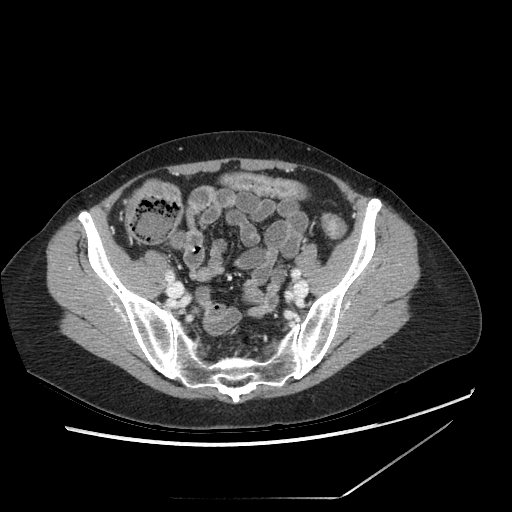
[im 73/175  soft-tissue]
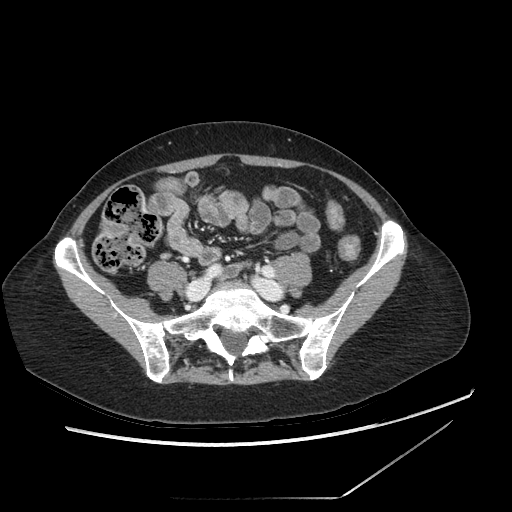
[im 88/175  soft-tissue]
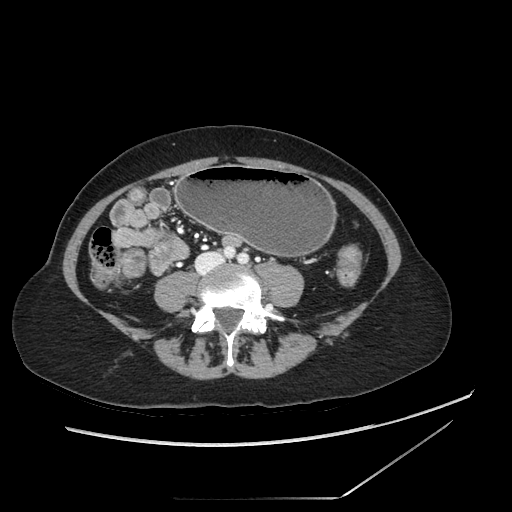
[im 102/175  soft-tissue]
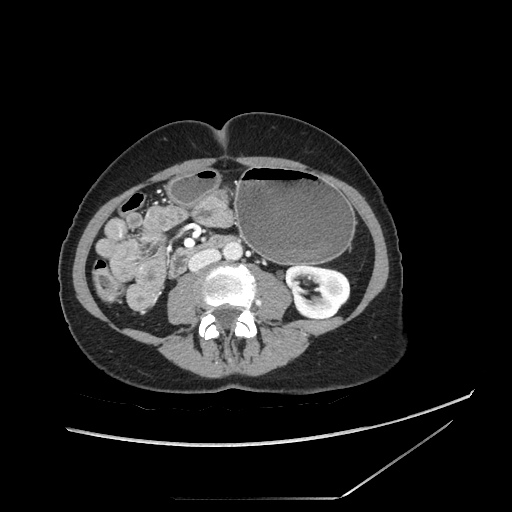
[im 117/175  soft-tissue]
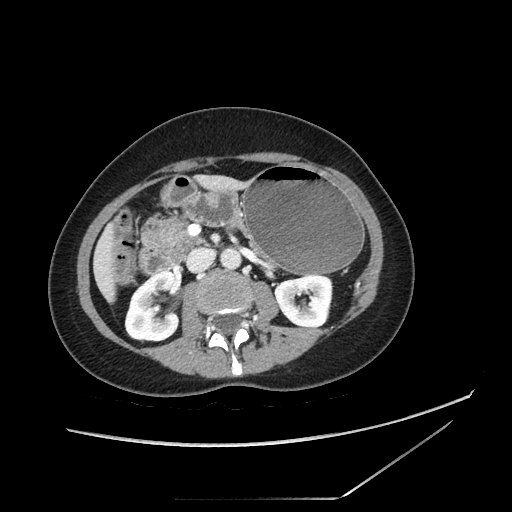
[im 117/175  lung]
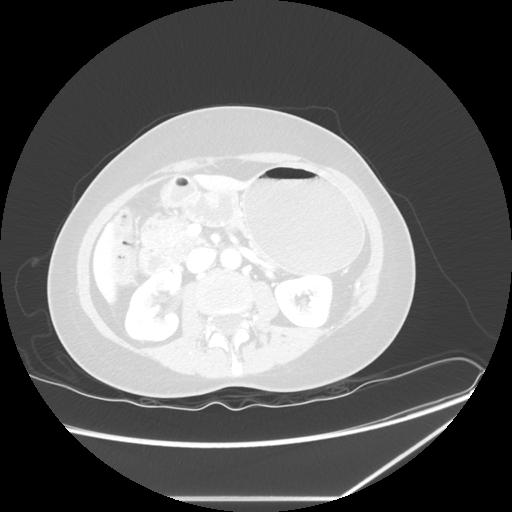
[im 131/175  soft-tissue]
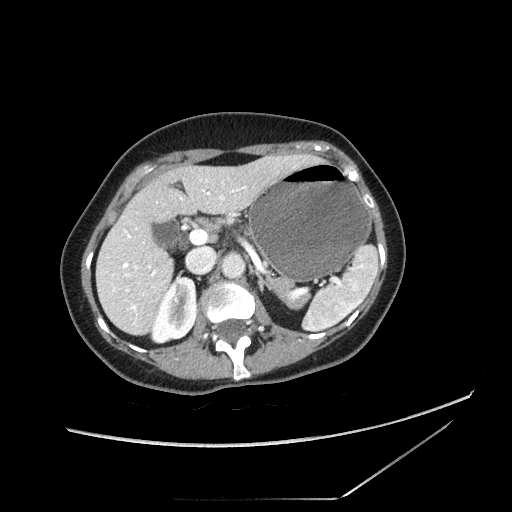
[im 131/175  lung]
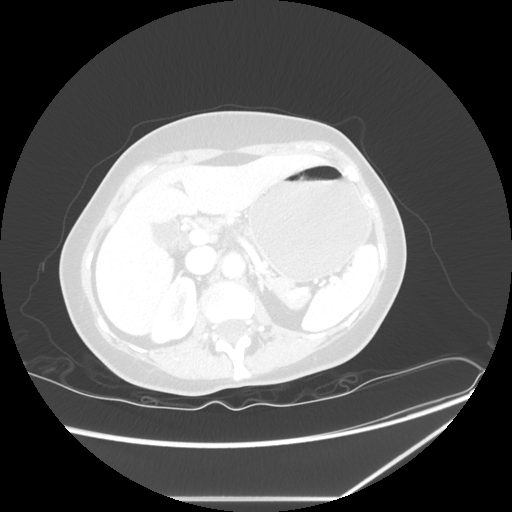
[im 131/175  bone]
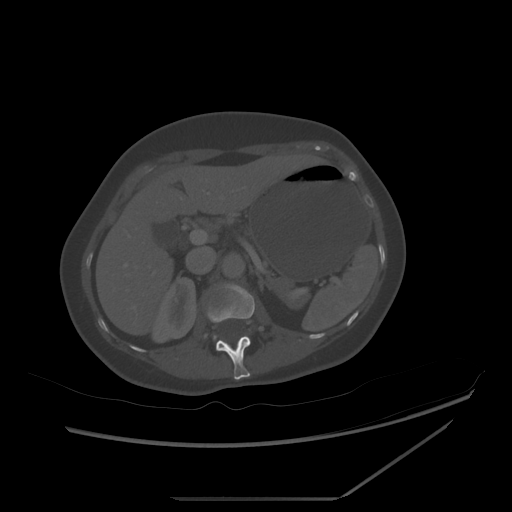
[im 146/175  soft-tissue]
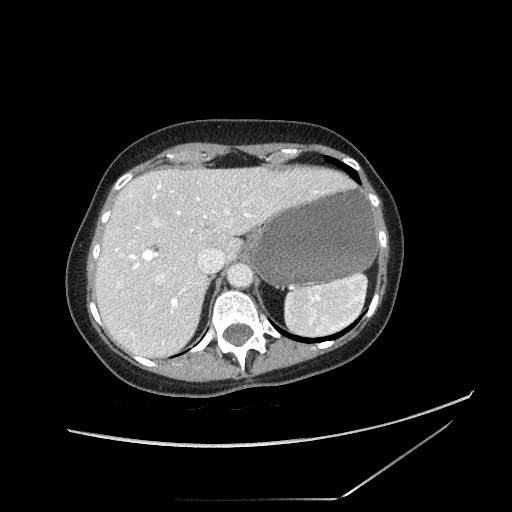
[im 146/175  lung]
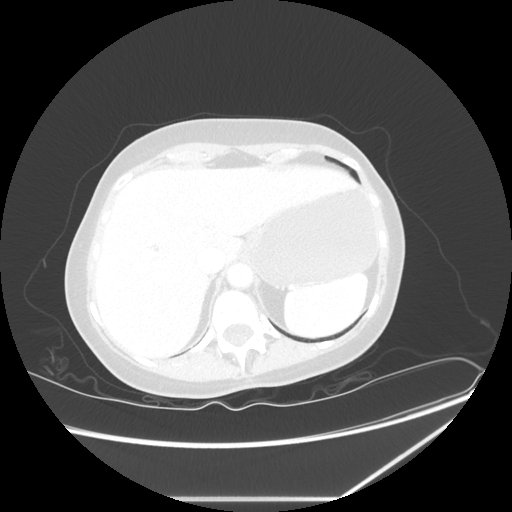
[im 160/175  soft-tissue]
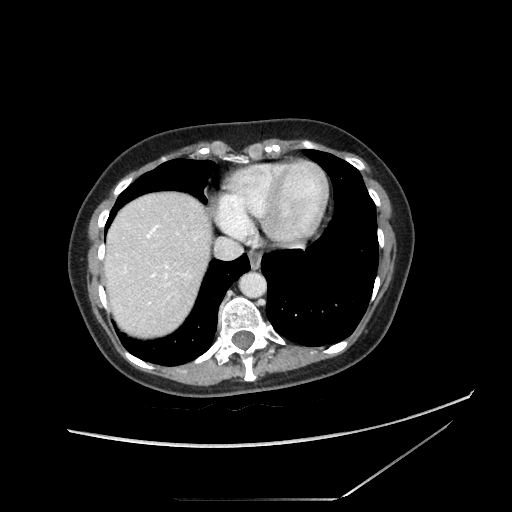
[im 160/175  lung]
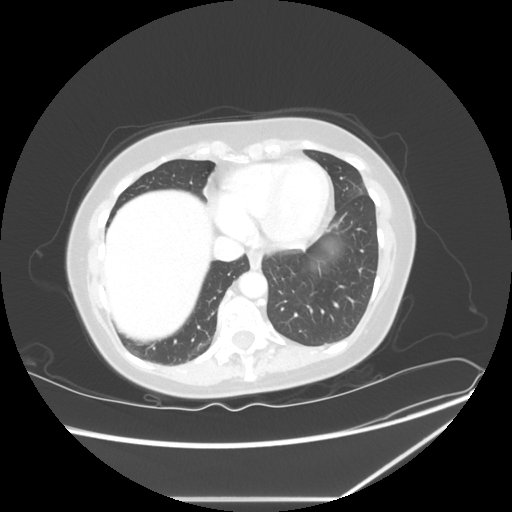

[Series 601: coronal body · coronal · 0.91mm/px · 1 of 105 slices shown, 2 images]
[im 35/105  soft-tissue]
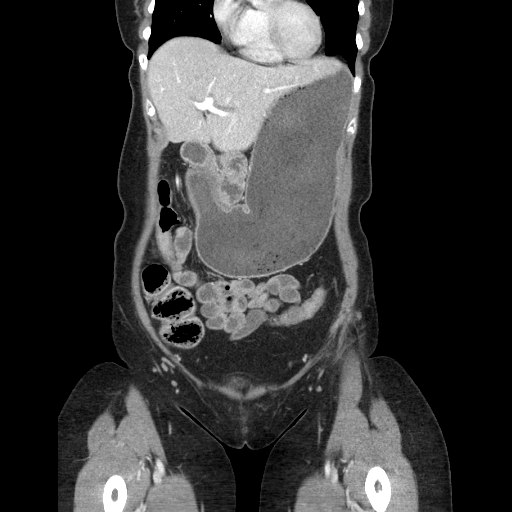
[im 35/105  bone]
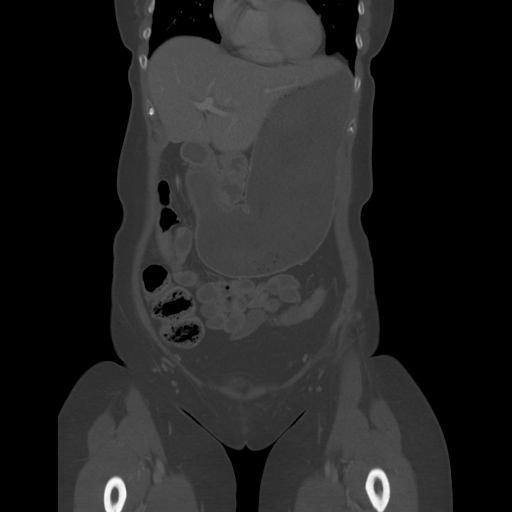

[12 of 36 positions shown; findings below may reference images not displayed]

FINDINGS: The liver, spleen, pancreas, and adrenal glands appear unremarkable.
Common bile duct caliber upper normal size range. Gallbladder
unremarkable.

The transverse duodenum extends to the midline but not significantly
to the left of the aorta, with most of the jejunum in the right
upper quadrant and right abdomen. Jejunal caliber and jejunal folds
are within normal limits.

Terminal ileum is currently well seen and does not appear inflamed.
There is low-level mucosal enhancement in the nondistended
transverse and descending colon which could reflect low grade
Crohn's colitis. Scant colonic diverticular noted. Appendix normal.
The stomach is distended during imaging.

Kidneys unremarkable. Sacroiliac joints within normal limits.
Endometrium 4-5 mm in thickness.

Mild dextro convex lumbar scoliosis.

There is mild atherosclerotic calcification in the abdominal aorta.
IMPRESSION: 1. Mildly accentuated mucosal enhancement in the transverse and
descending colon noted. There is nondistention of the colon and
accordingly this could be spurious, but the appearance suggests a
low-grade colitis.
2. No significant small-bowel inflammation is currently observed.
3. Malrotation is present, with the duodenum not sweeping
significantly past the midline, and with much of the jejunum in the
right abdomen.
4. Mild atherosclerosis.

## 2013-09-16 DIAGNOSIS — M5412 Radiculopathy, cervical region: Secondary | ICD-10-CM | POA: Diagnosis not present

## 2013-09-17 ENCOUNTER — Other Ambulatory Visit: Payer: Self-pay | Admitting: Family Medicine

## 2013-09-17 DIAGNOSIS — M5412 Radiculopathy, cervical region: Secondary | ICD-10-CM

## 2013-09-19 ENCOUNTER — Ambulatory Visit
Admission: RE | Admit: 2013-09-19 | Discharge: 2013-09-19 | Disposition: A | Discharge status: Home / Self Care | Payer: Medicare Other | Source: Ambulatory Visit | Attending: Family Medicine | Admitting: Family Medicine

## 2013-09-19 DIAGNOSIS — M47812 Spondylosis without myelopathy or radiculopathy, cervical region: Secondary | ICD-10-CM | POA: Diagnosis not present

## 2013-09-19 DIAGNOSIS — M5126 Other intervertebral disc displacement, lumbar region: Secondary | ICD-10-CM | POA: Diagnosis not present

## 2013-09-19 DIAGNOSIS — M5412 Radiculopathy, cervical region: Secondary | ICD-10-CM

## 2013-09-22 DIAGNOSIS — M5412 Radiculopathy, cervical region: Secondary | ICD-10-CM | POA: Diagnosis not present

## 2013-09-23 DIAGNOSIS — J309 Allergic rhinitis, unspecified: Secondary | ICD-10-CM | POA: Diagnosis not present

## 2013-09-23 DIAGNOSIS — M899 Disorder of bone, unspecified: Secondary | ICD-10-CM | POA: Diagnosis not present

## 2013-09-23 DIAGNOSIS — Z0000 Encounter for general adult medical examination without abnormal findings: Secondary | ICD-10-CM | POA: Diagnosis not present

## 2013-09-23 DIAGNOSIS — K5090 Crohn's disease, unspecified, without complications: Secondary | ICD-10-CM | POA: Diagnosis not present

## 2013-09-23 DIAGNOSIS — E559 Vitamin D deficiency, unspecified: Secondary | ICD-10-CM | POA: Diagnosis not present

## 2013-09-23 DIAGNOSIS — M949 Disorder of cartilage, unspecified: Secondary | ICD-10-CM | POA: Diagnosis not present

## 2013-09-23 DIAGNOSIS — Z136 Encounter for screening for cardiovascular disorders: Secondary | ICD-10-CM | POA: Diagnosis not present

## 2013-09-23 DIAGNOSIS — Z79899 Other long term (current) drug therapy: Secondary | ICD-10-CM | POA: Diagnosis not present

## 2013-09-27 DIAGNOSIS — M5412 Radiculopathy, cervical region: Secondary | ICD-10-CM | POA: Diagnosis not present

## 2013-09-27 DIAGNOSIS — M899 Disorder of bone, unspecified: Secondary | ICD-10-CM | POA: Diagnosis not present

## 2013-09-27 DIAGNOSIS — Z0000 Encounter for general adult medical examination without abnormal findings: Secondary | ICD-10-CM | POA: Diagnosis not present

## 2013-09-27 DIAGNOSIS — M949 Disorder of cartilage, unspecified: Secondary | ICD-10-CM | POA: Diagnosis not present

## 2013-09-27 DIAGNOSIS — K5090 Crohn's disease, unspecified, without complications: Secondary | ICD-10-CM | POA: Diagnosis not present

## 2013-09-27 DIAGNOSIS — Z1331 Encounter for screening for depression: Secondary | ICD-10-CM | POA: Diagnosis not present

## 2013-09-28 DIAGNOSIS — K5090 Crohn's disease, unspecified, without complications: Secondary | ICD-10-CM | POA: Diagnosis not present

## 2013-09-29 DIAGNOSIS — R030 Elevated blood-pressure reading, without diagnosis of hypertension: Secondary | ICD-10-CM | POA: Diagnosis not present

## 2013-09-29 DIAGNOSIS — M5412 Radiculopathy, cervical region: Secondary | ICD-10-CM | POA: Diagnosis not present

## 2013-10-05 DIAGNOSIS — Z8262 Family history of osteoporosis: Secondary | ICD-10-CM | POA: Diagnosis not present

## 2013-10-25 DIAGNOSIS — H2510 Age-related nuclear cataract, unspecified eye: Secondary | ICD-10-CM | POA: Diagnosis not present

## 2013-11-12 ENCOUNTER — Other Ambulatory Visit (HOSPITAL_COMMUNITY): Payer: Self-pay | Admitting: *Deleted

## 2013-11-15 ENCOUNTER — Encounter (HOSPITAL_COMMUNITY)
Admission: RE | Admit: 2013-11-15 | Discharge: 2013-11-15 | Disposition: A | Discharge status: Home / Self Care | Payer: Medicare Other | Source: Ambulatory Visit | Attending: Gastroenterology | Admitting: Gastroenterology

## 2013-11-15 DIAGNOSIS — K5090 Crohn's disease, unspecified, without complications: Secondary | ICD-10-CM | POA: Insufficient documentation

## 2013-11-15 MED ORDER — DIPHENHYDRAMINE HCL 25 MG PO CAPS
ORAL_CAPSULE | ORAL | Status: AC
  Filled 2013-11-15: qty 2

## 2013-11-15 MED ORDER — SODIUM CHLORIDE 0.9 % IV SOLN: INTRAVENOUS | Status: DC

## 2013-11-15 MED ORDER — SODIUM CHLORIDE 0.9 % IV SOLN
200.0000 mg | INTRAVENOUS | Status: DC
  Administered 2013-11-15: 200 mg via INTRAVENOUS
  Filled 2013-11-15: qty 20

## 2013-11-15 MED ORDER — DIPHENHYDRAMINE HCL 25 MG PO CAPS
50.0000 mg | ORAL_CAPSULE | ORAL | Status: DC
  Administered 2013-11-15: 50 mg via ORAL

## 2013-11-24 DIAGNOSIS — R51 Headache: Secondary | ICD-10-CM | POA: Diagnosis not present

## 2013-11-29 DIAGNOSIS — M8588 Other specified disorders of bone density and structure, other site: Secondary | ICD-10-CM | POA: Diagnosis not present

## 2013-11-30 DIAGNOSIS — R51 Headache: Secondary | ICD-10-CM | POA: Diagnosis not present

## 2014-01-14 ENCOUNTER — Other Ambulatory Visit (HOSPITAL_COMMUNITY): Payer: Self-pay | Admitting: *Deleted

## 2014-01-17 ENCOUNTER — Encounter (HOSPITAL_COMMUNITY)
Admission: RE | Admit: 2014-01-17 | Discharge: 2014-01-17 | Disposition: A | Discharge status: Home / Self Care | Payer: Medicare Other | Source: Ambulatory Visit | Attending: Gastroenterology | Admitting: Gastroenterology

## 2014-01-17 DIAGNOSIS — K5090 Crohn's disease, unspecified, without complications: Secondary | ICD-10-CM | POA: Diagnosis not present

## 2014-01-17 MED ORDER — DIPHENHYDRAMINE HCL 25 MG PO CAPS
50.0000 mg | ORAL_CAPSULE | ORAL | Status: DC
  Administered 2014-01-17: 50 mg via ORAL

## 2014-01-17 MED ORDER — DIPHENHYDRAMINE HCL 25 MG PO CAPS
ORAL_CAPSULE | ORAL | Status: AC
  Filled 2014-01-17: qty 2

## 2014-01-17 MED ORDER — SODIUM CHLORIDE 0.9 % IV SOLN
INTRAVENOUS | Status: DC
  Administered 2014-01-17: 10:00:00 via INTRAVENOUS

## 2014-01-17 MED ORDER — SODIUM CHLORIDE 0.9 % IV SOLN
200.0000 mg | INTRAVENOUS | Status: DC
  Administered 2014-01-17: 200 mg via INTRAVENOUS
  Filled 2014-01-17: qty 20

## 2014-01-25 DIAGNOSIS — E559 Vitamin D deficiency, unspecified: Secondary | ICD-10-CM | POA: Diagnosis not present

## 2014-01-25 DIAGNOSIS — M8580 Other specified disorders of bone density and structure, unspecified site: Secondary | ICD-10-CM | POA: Diagnosis not present

## 2014-02-09 DIAGNOSIS — L570 Actinic keratosis: Secondary | ICD-10-CM | POA: Diagnosis not present

## 2014-02-09 DIAGNOSIS — L719 Rosacea, unspecified: Secondary | ICD-10-CM | POA: Diagnosis not present

## 2014-02-09 DIAGNOSIS — Z85828 Personal history of other malignant neoplasm of skin: Secondary | ICD-10-CM | POA: Diagnosis not present

## 2014-02-09 DIAGNOSIS — D235 Other benign neoplasm of skin of trunk: Secondary | ICD-10-CM | POA: Diagnosis not present

## 2014-02-09 DIAGNOSIS — D2239 Melanocytic nevi of other parts of face: Secondary | ICD-10-CM | POA: Diagnosis not present

## 2014-02-14 DIAGNOSIS — N951 Menopausal and female climacteric states: Secondary | ICD-10-CM | POA: Diagnosis not present

## 2014-02-17 ENCOUNTER — Ambulatory Visit (INDEPENDENT_AMBULATORY_CARE_PROVIDER_SITE_OTHER): Payer: Medicare Other

## 2014-02-17 ENCOUNTER — Telehealth: Payer: Self-pay | Admitting: Certified Nurse Midwife

## 2014-02-17 VITALS — BP 110/68 | HR 70 | Ht 61.25 in | Wt 113.0 lb

## 2014-02-17 DIAGNOSIS — R350 Frequency of micturition: Secondary | ICD-10-CM | POA: Diagnosis not present

## 2014-02-17 LAB — POCT URINALYSIS DIPSTICK
Bilirubin, UA: NEGATIVE
Blood, UA: NEGATIVE
Glucose, UA: NEGATIVE
Ketones, UA: NEGATIVE
Leukocytes, UA: NEGATIVE
Nitrite, UA: NEGATIVE
Protein, UA: NEGATIVE
Urobilinogen, UA: NEGATIVE
pH, UA: 5

## 2014-02-17 NOTE — Telephone Encounter (Signed)
Spoke with pt. Last seen 06/2012 and dx with Urethral Prolapse.   Patient states for two months has had intermittent L lower pelvic pain. Unsure what provokes pain, but that it comes and goes. Also, has lower back pain x 2 months, which she states she felt it was due to sitting on an uncomfortable couch.  Patient expresses worry about cancer concerns.  Patient with hx of Crohns disease, but states that pain is usually on R side.  Patient reports two weeks of urinary frequency, worse at night. Has tried to decrease fluids at night without improvement. No dysuria, no fevers, no flank pain, no nausea, no vomiting, no chills, no hematuria.  Patient declines appointment at this time due to pending inclement weather.  Requests Monday appointment. Scheduled for 02/21/14 at 1530. Advised patient if develops any pain with urination, increase in symptoms, fevers, chills, flank pain, or blood in urine to call back to our office or go to local urgent care or ER. She verbalizes understanding of instructions and symptoms that require immediate evaluation.   Routing to Regina Eck CNM to review.

## 2014-02-17 NOTE — Telephone Encounter (Signed)
Patient is Cassandra Herring to see Evalee Mutton , patient thinks she may have "urethial prolapse". Patient last seen 07/27/2012.

## 2014-02-17 NOTE — Telephone Encounter (Signed)
Spoke with Regina Eck CNM  and okay to have patient come in for nurse visit today for UA only. If negative, okay to wait for office visit on Monday.

## 2014-02-21 ENCOUNTER — Encounter: Payer: Self-pay | Admitting: Certified Nurse Midwife

## 2014-02-21 ENCOUNTER — Ambulatory Visit (INDEPENDENT_AMBULATORY_CARE_PROVIDER_SITE_OTHER): Payer: Medicare Other | Admitting: Certified Nurse Midwife

## 2014-02-21 VITALS — BP 120/62 | HR 68 | Temp 97.7°F | Resp 16 | Ht 61.25 in | Wt 112.0 lb

## 2014-02-21 DIAGNOSIS — N390 Urinary tract infection, site not specified: Secondary | ICD-10-CM | POA: Diagnosis not present

## 2014-02-21 LAB — POCT URINALYSIS DIPSTICK
Bilirubin, UA: NEGATIVE
Blood, UA: NEGATIVE
Glucose, UA: NEGATIVE
Ketones, UA: NEGATIVE
Leukocytes, UA: NEGATIVE
Nitrite, UA: NEGATIVE
Protein, UA: NEGATIVE
Urobilinogen, UA: NEGATIVE
pH, UA: 5

## 2014-02-21 NOTE — Patient Instructions (Signed)
Asymptomatic Bacteriuria Asymptomatic bacteriuria is the presence of a large number of bacteria in your urine without the usual symptoms of burning or frequent urination. The following conditions increase the risk of asymptomatic bacteriuria:  Diabetes mellitus.  Advanced age.  Pregnancy in the first trimester.  Kidney stones.  Kidney transplants.  Leaky kidney tube valve in young children (reflux). Treatment for this condition is not needed in most people and can lead to other problems such as too much yeast and growth of resistant bacteria. However, some people, such as pregnant women, do need treatment to prevent kidney infection. Asymptomatic bacteriuria in pregnancy is also associated with fetal growth restriction, premature labor, and newborn death. HOME CARE INSTRUCTIONS Monitor your condition for any changes. The following actions may help to relieve any discomfort you are feeling:  Drink enough water and fluids to keep your urine clear or pale yellow. Go to the bathroom more often to keep your bladder empty.  Keep the area around your vagina and rectum clean. Wipe yourself from front to back after urinating. SEEK IMMEDIATE MEDICAL CARE IF:  You develop signs of an infection such as:  Burning with urination.  Frequency of voiding.  Back pain.  Fever.  You have blood in the urine.  You develop a fever. MAKE SURE YOU:  Understand these instructions.  Will watch your condition.  Will get help right away if you are not doing well or get worse. Document Released: 01/14/2005 Document Revised: 05/31/2013 Document Reviewed: 07/06/2012 [employer] Patient Information 2015 [employer]. This information is not intended to replace advice given to you by your health care provider. Make sure you discuss any questions you have with your health care provider.  

## 2014-02-21 NOTE — Progress Notes (Signed)
69 y.o. married white female g0p0 here with complaint of increase urgency with onset   2 weeks ago.. Patient complaining of urinary frequency/urgency at times, no pain and some incontinence with some leaking after emptying bladder.She feels she hurries in and then pushes to empty to quickly finish. Patient denies fever, chills, nausea or back pain. No new personal products. Patient feels not related to sexual activity, only touching with sexual activity.. Denies any vaginal symptoms or new personal products. . Menopausal with no vaginal dryness. Stopped Vagifem for concerns about pain with use and not noticed any change any vagina or dryness since stopping.Patient continues with Remicade for Crohns and follow up. Patient had been reading about Ovarian cancer and was noting some LLQ pain that she had attributed to ? Gas and also noted a sharp pain in hip area when occurs. No change in stools. She also thought it was related to sitting in hard chairs. Occurs sporadic, no regularly. No pain noted today. Wanted pelvic exam to make sure all normal.   O: Healthy female WDWN Affect: Normal, orientation x 3 Skin : warm and dry CVAT: negative bilateral Abdomen: negative for suprapubic tenderness, soft, positive bowel sounds all 4 quadrants, no rebound tenderness, could not illicit pain with palpation.  Pelvic exam: External genital area: normal, no lesions Bladder,Urethra, Urethral meatus:non tender, prominent urethral meatus, very slight prolapse, no change from previous exam Vagina: normal appearance, no atrophy, moisture noted, non tender, good support noted Cervix: normal, non tender Uterus:normal,non tender, small Adnexa: normal non tender, no fullness or masses noted   A:Normal pelvic exam History of Crohns, on stable medication  R/O UTI  Poct urine-neg  P: Reviewed findings of normal pelvic exam and no change in urethra. Discussed no pain noted or point pain she has noticed. Patient reassured  pelvic exam normal. Discussed taking to empty bladder, work on [place], demonstrated good tone, and see in incontinence resolves. If not needs to notify for further evaluation if needed. Continue good water intake. Discussed there is no good screening for Ovarian cancer, but offered PUS even though no family history. Patient declined. Encouraged patient to discuss with GI her symptoms to make sure no issues with Crohns. Patient agreeable.  KDX:IPJAS culture Reviewed warning signs and symptoms of UTI Encouraged to limit soda, tea, and coffee  RV prn

## 2014-02-22 NOTE — Progress Notes (Signed)
Reviewed personally.  M. Suzanne Eliyanah Elgersma, MD.  

## 2014-02-23 LAB — URINE CULTURE: Colony Count: >=100000

## 2014-03-10 DIAGNOSIS — M25511 Pain in right shoulder: Secondary | ICD-10-CM | POA: Diagnosis not present

## 2014-03-10 DIAGNOSIS — M810 Age-related osteoporosis without current pathological fracture: Secondary | ICD-10-CM | POA: Diagnosis not present

## 2014-03-10 DIAGNOSIS — M542 Cervicalgia: Secondary | ICD-10-CM | POA: Diagnosis not present

## 2014-03-13 DIAGNOSIS — R079 Chest pain, unspecified: Secondary | ICD-10-CM | POA: Diagnosis not present

## 2014-03-15 DIAGNOSIS — M19011 Primary osteoarthritis, right shoulder: Secondary | ICD-10-CM | POA: Diagnosis not present

## 2014-03-16 ENCOUNTER — Other Ambulatory Visit (HOSPITAL_COMMUNITY): Payer: Self-pay | Admitting: *Deleted

## 2014-03-17 ENCOUNTER — Encounter (HOSPITAL_COMMUNITY)
Admission: RE | Admit: 2014-03-17 | Discharge: 2014-03-17 | Disposition: A | Discharge status: Home / Self Care | Payer: Medicare Other | Source: Ambulatory Visit | Attending: Gastroenterology | Admitting: Gastroenterology

## 2014-03-17 DIAGNOSIS — K5090 Crohn's disease, unspecified, without complications: Secondary | ICD-10-CM | POA: Insufficient documentation

## 2014-03-17 MED ORDER — DIPHENHYDRAMINE HCL 25 MG PO CAPS
50.0000 mg | ORAL_CAPSULE | ORAL | Status: DC
  Administered 2014-03-17: 50 mg via ORAL

## 2014-03-17 MED ORDER — DIPHENHYDRAMINE HCL 25 MG PO CAPS
ORAL_CAPSULE | ORAL | Status: AC
  Filled 2014-03-17: qty 2

## 2014-03-17 MED ORDER — SODIUM CHLORIDE 0.9 % IV SOLN
200.0000 mg | INTRAVENOUS | Status: DC
  Administered 2014-03-17: 200 mg via INTRAVENOUS
  Filled 2014-03-17: qty 20

## 2014-03-17 MED ORDER — SODIUM CHLORIDE 0.9 % IV SOLN
INTRAVENOUS | Status: DC
  Administered 2014-03-17: 09:00:00 via INTRAVENOUS

## 2014-03-18 ENCOUNTER — Encounter (HOSPITAL_COMMUNITY): Payer: Medicare Other

## 2014-03-21 ENCOUNTER — Encounter (HOSPITAL_COMMUNITY): Payer: Medicare Other

## 2014-03-21 DIAGNOSIS — M25511 Pain in right shoulder: Secondary | ICD-10-CM | POA: Diagnosis not present

## 2014-03-21 DIAGNOSIS — M19011 Primary osteoarthritis, right shoulder: Secondary | ICD-10-CM | POA: Diagnosis not present

## 2014-04-18 DIAGNOSIS — M19011 Primary osteoarthritis, right shoulder: Secondary | ICD-10-CM | POA: Diagnosis not present

## 2014-05-18 ENCOUNTER — Other Ambulatory Visit (HOSPITAL_COMMUNITY): Payer: Self-pay | Admitting: *Deleted

## 2014-05-19 ENCOUNTER — Encounter (HOSPITAL_COMMUNITY)
Admission: RE | Admit: 2014-05-19 | Discharge: 2014-05-19 | Disposition: A | Discharge status: Home / Self Care | Payer: Medicare Other | Source: Ambulatory Visit | Attending: Gastroenterology | Admitting: Gastroenterology

## 2014-05-19 DIAGNOSIS — K5090 Crohn's disease, unspecified, without complications: Secondary | ICD-10-CM | POA: Insufficient documentation

## 2014-05-19 MED ORDER — DIPHENHYDRAMINE HCL 25 MG PO CAPS
50.0000 mg | ORAL_CAPSULE | ORAL | Status: DC
  Administered 2014-05-19: 50 mg via ORAL

## 2014-05-19 MED ORDER — DIPHENHYDRAMINE HCL 25 MG PO CAPS
ORAL_CAPSULE | ORAL | Status: AC
  Filled 2014-05-19: qty 2

## 2014-05-19 MED ORDER — SODIUM CHLORIDE 0.9 % IV SOLN
200.0000 mg | INTRAVENOUS | Status: DC
  Administered 2014-05-19: 200 mg via INTRAVENOUS
  Filled 2014-05-19: qty 20

## 2014-05-19 MED ORDER — SODIUM CHLORIDE 0.9 % IV SOLN
INTRAVENOUS | Status: DC
  Administered 2014-05-19: 09:00:00 via INTRAVENOUS

## 2014-05-23 DIAGNOSIS — M7501 Adhesive capsulitis of right shoulder: Secondary | ICD-10-CM | POA: Diagnosis not present

## 2014-06-20 DIAGNOSIS — H5711 Ocular pain, right eye: Secondary | ICD-10-CM | POA: Diagnosis not present

## 2014-07-22 ENCOUNTER — Other Ambulatory Visit (HOSPITAL_COMMUNITY): Payer: Self-pay | Admitting: *Deleted

## 2014-07-25 ENCOUNTER — Encounter (HOSPITAL_COMMUNITY)
Admission: RE | Admit: 2014-07-25 | Discharge: 2014-07-25 | Disposition: A | Discharge status: Home / Self Care | Payer: Medicare Other | Source: Ambulatory Visit | Attending: Gastroenterology | Admitting: Gastroenterology

## 2014-07-25 DIAGNOSIS — K5090 Crohn's disease, unspecified, without complications: Secondary | ICD-10-CM | POA: Insufficient documentation

## 2014-07-25 MED ORDER — DIPHENHYDRAMINE HCL 25 MG PO CAPS
ORAL_CAPSULE | ORAL | Status: AC
  Filled 2014-07-25: qty 2

## 2014-07-25 MED ORDER — SODIUM CHLORIDE 0.9 % IV SOLN
INTRAVENOUS | Status: DC
  Administered 2014-07-25: 10:00:00 via INTRAVENOUS

## 2014-07-25 MED ORDER — SODIUM CHLORIDE 0.9 % IV SOLN
200.0000 mg | INTRAVENOUS | Status: DC
  Administered 2014-07-25: 200 mg via INTRAVENOUS
  Filled 2014-07-25: qty 20

## 2014-07-25 MED ORDER — DIPHENHYDRAMINE HCL 25 MG PO CAPS
50.0000 mg | ORAL_CAPSULE | ORAL | Status: DC
  Administered 2014-07-25: 50 mg via ORAL

## 2014-09-15 DIAGNOSIS — K5090 Crohn's disease, unspecified, without complications: Secondary | ICD-10-CM | POA: Diagnosis not present

## 2014-09-27 ENCOUNTER — Other Ambulatory Visit (HOSPITAL_COMMUNITY): Payer: Self-pay | Admitting: *Deleted

## 2014-09-28 ENCOUNTER — Encounter (HOSPITAL_COMMUNITY)
Admission: RE | Admit: 2014-09-28 | Discharge: 2014-09-28 | Disposition: A | Discharge status: Home / Self Care | Payer: Medicare Other | Source: Ambulatory Visit | Attending: Gastroenterology | Admitting: Gastroenterology

## 2014-09-28 DIAGNOSIS — K5090 Crohn's disease, unspecified, without complications: Secondary | ICD-10-CM | POA: Insufficient documentation

## 2014-09-28 MED ORDER — SODIUM CHLORIDE 0.9 % IV SOLN
INTRAVENOUS | Status: DC
  Administered 2014-09-28: 09:00:00 via INTRAVENOUS

## 2014-09-28 MED ORDER — SODIUM CHLORIDE 0.9 % IV SOLN
200.0000 mg | INTRAVENOUS | Status: DC
  Administered 2014-09-28: 200 mg via INTRAVENOUS
  Filled 2014-09-28: qty 20

## 2014-09-28 MED ORDER — DIPHENHYDRAMINE HCL 25 MG PO CAPS
ORAL_CAPSULE | ORAL | Status: AC
Start: 2014-09-28 — End: 2014-09-28
  Filled 2014-09-28: qty 2

## 2014-09-28 MED ORDER — DIPHENHYDRAMINE HCL 25 MG PO TABS
50.0000 mg | ORAL_TABLET | ORAL | Status: DC
  Administered 2014-09-28: 50 mg via ORAL
  Filled 2014-09-28: qty 2

## 2014-09-29 DIAGNOSIS — L820 Inflamed seborrheic keratosis: Secondary | ICD-10-CM | POA: Diagnosis not present

## 2014-09-29 DIAGNOSIS — D485 Neoplasm of uncertain behavior of skin: Secondary | ICD-10-CM | POA: Diagnosis not present

## 2014-10-10 DIAGNOSIS — Z79899 Other long term (current) drug therapy: Secondary | ICD-10-CM | POA: Diagnosis not present

## 2014-10-19 DIAGNOSIS — E042 Nontoxic multinodular goiter: Secondary | ICD-10-CM | POA: Diagnosis not present

## 2014-10-26 ENCOUNTER — Other Ambulatory Visit: Payer: Self-pay | Admitting: Internal Medicine

## 2014-10-26 DIAGNOSIS — E041 Nontoxic single thyroid nodule: Secondary | ICD-10-CM

## 2014-10-31 ENCOUNTER — Ambulatory Visit
Admission: RE | Admit: 2014-10-31 | Discharge: 2014-10-31 | Disposition: A | Discharge status: Home / Self Care | Payer: Medicare Other | Source: Ambulatory Visit | Attending: Internal Medicine | Admitting: Internal Medicine

## 2014-10-31 DIAGNOSIS — E041 Nontoxic single thyroid nodule: Secondary | ICD-10-CM

## 2014-11-08 DIAGNOSIS — Z8719 Personal history of other diseases of the digestive system: Secondary | ICD-10-CM | POA: Diagnosis not present

## 2014-11-08 DIAGNOSIS — K5010 Crohn's disease of large intestine without complications: Secondary | ICD-10-CM | POA: Diagnosis not present

## 2014-11-08 DIAGNOSIS — K5080 Crohn's disease of both small and large intestine without complications: Secondary | ICD-10-CM | POA: Diagnosis not present

## 2014-11-08 DIAGNOSIS — K640 First degree hemorrhoids: Secondary | ICD-10-CM | POA: Diagnosis not present

## 2014-11-08 DIAGNOSIS — Z09 Encounter for follow-up examination after completed treatment for conditions other than malignant neoplasm: Secondary | ICD-10-CM | POA: Diagnosis not present

## 2014-11-28 ENCOUNTER — Encounter (HOSPITAL_COMMUNITY)
Admission: RE | Admit: 2014-11-28 | Discharge: 2014-11-28 | Disposition: A | Discharge status: Home / Self Care | Payer: Medicare Other | Source: Ambulatory Visit | Attending: Gastroenterology | Admitting: Gastroenterology

## 2014-11-28 DIAGNOSIS — K5090 Crohn's disease, unspecified, without complications: Secondary | ICD-10-CM | POA: Insufficient documentation

## 2014-11-28 MED ORDER — DIPHENHYDRAMINE HCL 25 MG PO CAPS
ORAL_CAPSULE | ORAL | Status: AC
  Filled 2014-11-28: qty 2

## 2014-11-28 MED ORDER — SODIUM CHLORIDE 0.9 % IV SOLN
200.0000 mg | Freq: Once | INTRAVENOUS | Status: AC
  Administered 2014-11-28: 200 mg via INTRAVENOUS
  Filled 2014-11-28: qty 20

## 2014-11-28 MED ORDER — DIPHENHYDRAMINE HCL 25 MG PO TABS
50.0000 mg | ORAL_TABLET | Freq: Once | ORAL | Status: AC
  Administered 2014-11-28: 50 mg via ORAL
  Filled 2014-11-28: qty 2

## 2014-11-28 MED ORDER — SODIUM CHLORIDE 0.9 % IV SOLN
Freq: Once | INTRAVENOUS | Status: AC
  Administered 2014-11-28: 10:00:00 via INTRAVENOUS

## 2014-12-05 ENCOUNTER — Encounter (HOSPITAL_COMMUNITY): Payer: Medicare Other

## 2014-12-13 DIAGNOSIS — Z0000 Encounter for general adult medical examination without abnormal findings: Secondary | ICD-10-CM | POA: Diagnosis not present

## 2014-12-13 DIAGNOSIS — R7301 Impaired fasting glucose: Secondary | ICD-10-CM | POA: Diagnosis not present

## 2014-12-13 DIAGNOSIS — E7800 Pure hypercholesterolemia, unspecified: Secondary | ICD-10-CM | POA: Diagnosis not present

## 2014-12-13 DIAGNOSIS — Z136 Encounter for screening for cardiovascular disorders: Secondary | ICD-10-CM | POA: Diagnosis not present

## 2014-12-20 DIAGNOSIS — Z1389 Encounter for screening for other disorder: Secondary | ICD-10-CM | POA: Diagnosis not present

## 2014-12-20 DIAGNOSIS — E162 Hypoglycemia, unspecified: Secondary | ICD-10-CM | POA: Diagnosis not present

## 2014-12-20 DIAGNOSIS — Z0000 Encounter for general adult medical examination without abnormal findings: Secondary | ICD-10-CM | POA: Diagnosis not present

## 2014-12-20 DIAGNOSIS — R799 Abnormal finding of blood chemistry, unspecified: Secondary | ICD-10-CM | POA: Diagnosis not present

## 2015-01-31 ENCOUNTER — Other Ambulatory Visit (HOSPITAL_COMMUNITY): Payer: Self-pay | Admitting: *Deleted

## 2015-02-01 ENCOUNTER — Encounter (HOSPITAL_COMMUNITY)
Admission: RE | Admit: 2015-02-01 | Discharge: 2015-02-01 | Disposition: A | Discharge status: Home / Self Care | Payer: Medicare Other | Source: Ambulatory Visit | Attending: Gastroenterology | Admitting: Gastroenterology

## 2015-02-01 DIAGNOSIS — K5090 Crohn's disease, unspecified, without complications: Secondary | ICD-10-CM | POA: Insufficient documentation

## 2015-02-01 MED ORDER — SODIUM CHLORIDE 0.9 % IV SOLN
200.0000 mg | INTRAVENOUS | Status: DC
  Administered 2015-02-01: 200 mg via INTRAVENOUS
  Filled 2015-02-01: qty 20

## 2015-02-01 MED ORDER — DIPHENHYDRAMINE HCL 25 MG PO CAPS
50.0000 mg | ORAL_CAPSULE | Freq: Once | ORAL | Status: AC
Start: 2015-02-01 — End: 2015-02-01
  Administered 2015-02-01: 50 mg via ORAL

## 2015-02-01 MED ORDER — SODIUM CHLORIDE 0.9 % IV SOLN
INTRAVENOUS | Status: DC
  Administered 2015-02-01: 09:00:00 via INTRAVENOUS

## 2015-02-01 MED ORDER — DIPHENHYDRAMINE HCL 25 MG PO CAPS
ORAL_CAPSULE | ORAL | Status: AC
  Filled 2015-02-01: qty 2

## 2015-02-23 DIAGNOSIS — D225 Melanocytic nevi of trunk: Secondary | ICD-10-CM | POA: Diagnosis not present

## 2015-02-23 DIAGNOSIS — Z872 Personal history of diseases of the skin and subcutaneous tissue: Secondary | ICD-10-CM | POA: Diagnosis not present

## 2015-02-23 DIAGNOSIS — L821 Other seborrheic keratosis: Secondary | ICD-10-CM | POA: Diagnosis not present

## 2015-02-23 DIAGNOSIS — L719 Rosacea, unspecified: Secondary | ICD-10-CM | POA: Diagnosis not present

## 2015-02-23 DIAGNOSIS — Z23 Encounter for immunization: Secondary | ICD-10-CM | POA: Diagnosis not present

## 2015-02-23 DIAGNOSIS — Z85828 Personal history of other malignant neoplasm of skin: Secondary | ICD-10-CM | POA: Diagnosis not present

## 2015-03-13 DIAGNOSIS — N951 Menopausal and female climacteric states: Secondary | ICD-10-CM | POA: Diagnosis not present

## 2015-04-05 ENCOUNTER — Other Ambulatory Visit (HOSPITAL_COMMUNITY): Payer: Self-pay | Admitting: *Deleted

## 2015-04-06 ENCOUNTER — Ambulatory Visit (HOSPITAL_COMMUNITY)
Admission: RE | Admit: 2015-04-06 | Discharge: 2015-04-06 | Disposition: A | Discharge status: Home / Self Care | Payer: Medicare Other | Source: Ambulatory Visit | Attending: Gastroenterology | Admitting: Gastroenterology

## 2015-04-06 DIAGNOSIS — K5090 Crohn's disease, unspecified, without complications: Secondary | ICD-10-CM | POA: Insufficient documentation

## 2015-04-06 DIAGNOSIS — Z79899 Other long term (current) drug therapy: Secondary | ICD-10-CM | POA: Insufficient documentation

## 2015-04-06 MED ORDER — SODIUM CHLORIDE 0.9 % IV SOLN
200.0000 mg | INTRAVENOUS | Status: DC
  Administered 2015-04-06: 200 mg via INTRAVENOUS
  Filled 2015-04-06: qty 20

## 2015-04-06 MED ORDER — SODIUM CHLORIDE 0.9 % IV SOLN
INTRAVENOUS | Status: DC
  Administered 2015-04-06: 09:00:00 via INTRAVENOUS

## 2015-04-06 MED ORDER — DIPHENHYDRAMINE HCL 25 MG PO CAPS
ORAL_CAPSULE | ORAL | Status: AC
  Filled 2015-04-06: qty 2

## 2015-04-06 MED ORDER — DIPHENHYDRAMINE HCL 25 MG PO CAPS
50.0000 mg | ORAL_CAPSULE | ORAL | Status: DC
  Administered 2015-04-06: 50 mg via ORAL

## 2015-05-05 DIAGNOSIS — K5090 Crohn's disease, unspecified, without complications: Secondary | ICD-10-CM | POA: Diagnosis not present

## 2015-06-09 DIAGNOSIS — R6884 Jaw pain: Secondary | ICD-10-CM | POA: Diagnosis not present

## 2015-06-13 ENCOUNTER — Other Ambulatory Visit (HOSPITAL_COMMUNITY): Payer: Self-pay | Admitting: *Deleted

## 2015-06-14 ENCOUNTER — Encounter (HOSPITAL_COMMUNITY)
Admission: RE | Admit: 2015-06-14 | Discharge: 2015-06-14 | Disposition: A | Discharge status: Home / Self Care | Payer: Medicare Other | Source: Ambulatory Visit | Attending: Gastroenterology | Admitting: Gastroenterology

## 2015-06-14 DIAGNOSIS — K5090 Crohn's disease, unspecified, without complications: Secondary | ICD-10-CM | POA: Diagnosis not present

## 2015-06-14 MED ORDER — INFLIXIMAB 100 MG IV SOLR
200.0000 mg | INTRAVENOUS | Status: AC
  Administered 2015-06-14: 200 mg via INTRAVENOUS
  Filled 2015-06-14: qty 20

## 2015-06-14 MED ORDER — SODIUM CHLORIDE 0.9 % IV SOLN
INTRAVENOUS | Status: DC
  Administered 2015-06-14: 09:00:00 via INTRAVENOUS

## 2015-06-14 MED ORDER — DIPHENHYDRAMINE HCL 25 MG PO CAPS
50.0000 mg | ORAL_CAPSULE | ORAL | Status: DC
  Administered 2015-06-14: 50 mg via ORAL

## 2015-06-14 MED ORDER — DIPHENHYDRAMINE HCL 25 MG PO CAPS
ORAL_CAPSULE | ORAL | Status: AC
  Filled 2015-06-14: qty 2

## 2015-06-27 DIAGNOSIS — E041 Nontoxic single thyroid nodule: Secondary | ICD-10-CM | POA: Insufficient documentation

## 2015-08-15 ENCOUNTER — Encounter (HOSPITAL_COMMUNITY)
Admission: RE | Admit: 2015-08-15 | Discharge: 2015-08-15 | Disposition: A | Discharge status: Home / Self Care | Payer: Medicare Other | Source: Ambulatory Visit | Attending: Gastroenterology | Admitting: Gastroenterology

## 2015-08-15 DIAGNOSIS — K5090 Crohn's disease, unspecified, without complications: Secondary | ICD-10-CM | POA: Diagnosis not present

## 2015-08-15 MED ORDER — INFLIXIMAB 100 MG IV SOLR
200.0000 mg | INTRAVENOUS | Status: DC
  Administered 2015-08-15: 200 mg via INTRAVENOUS
  Filled 2015-08-15: qty 20

## 2015-08-15 MED ORDER — DIPHENHYDRAMINE HCL 25 MG PO CAPS
ORAL_CAPSULE | ORAL | Status: AC
Start: 2015-08-15 — End: 2015-08-15
  Filled 2015-08-15: qty 2

## 2015-08-15 MED ORDER — SODIUM CHLORIDE 0.9 % IV SOLN
INTRAVENOUS | Status: DC
  Administered 2015-08-15: 09:00:00 via INTRAVENOUS

## 2015-08-15 MED ORDER — DIPHENHYDRAMINE HCL 25 MG PO TABS
50.0000 mg | ORAL_TABLET | ORAL | Status: DC
  Administered 2015-08-15: 50 mg via ORAL
  Filled 2015-08-15: qty 2

## 2015-08-17 DIAGNOSIS — H2513 Age-related nuclear cataract, bilateral: Secondary | ICD-10-CM | POA: Diagnosis not present

## 2015-10-09 DIAGNOSIS — M8589 Other specified disorders of bone density and structure, multiple sites: Secondary | ICD-10-CM | POA: Diagnosis not present

## 2015-10-13 DIAGNOSIS — M8580 Other specified disorders of bone density and structure, unspecified site: Secondary | ICD-10-CM | POA: Diagnosis not present

## 2015-10-17 ENCOUNTER — Encounter (HOSPITAL_COMMUNITY)
Admission: RE | Admit: 2015-10-17 | Discharge: 2015-10-17 | Disposition: A | Discharge status: Home / Self Care | Payer: Medicare Other | Source: Ambulatory Visit | Attending: Gastroenterology | Admitting: Gastroenterology

## 2015-10-17 ENCOUNTER — Other Ambulatory Visit (HOSPITAL_COMMUNITY): Payer: Self-pay | Admitting: *Deleted

## 2015-10-17 DIAGNOSIS — K5090 Crohn's disease, unspecified, without complications: Secondary | ICD-10-CM | POA: Insufficient documentation

## 2015-10-17 MED ORDER — DIPHENHYDRAMINE HCL 25 MG PO CAPS
ORAL_CAPSULE | ORAL | Status: AC
  Filled 2015-10-17: qty 2

## 2015-10-17 MED ORDER — SODIUM CHLORIDE 0.9 % IV SOLN
INTRAVENOUS | Status: DC
  Administered 2015-10-17: 10:00:00 via INTRAVENOUS

## 2015-10-17 MED ORDER — SODIUM CHLORIDE 0.9 % IV SOLN
200.0000 mg | INTRAVENOUS | Status: DC
  Administered 2015-10-17: 200 mg via INTRAVENOUS
  Filled 2015-10-17: qty 20

## 2015-10-17 MED ORDER — DIPHENHYDRAMINE HCL 25 MG PO CAPS
50.0000 mg | ORAL_CAPSULE | ORAL | Status: DC
  Administered 2015-10-17: 50 mg via ORAL

## 2015-10-18 DIAGNOSIS — M899 Disorder of bone, unspecified: Secondary | ICD-10-CM | POA: Diagnosis not present

## 2015-10-21 IMAGING — US US SOFT TISSUE HEAD/NECK
1 series · 14 of 25 positions shown · non-contrast
Comparison: 06/08/2010

CLINICAL DATA: Follow-up nodule

EXAM:
THYROID ULTRASOUND
TECHNIQUE: Ultrasound examination of the thyroid gland and adjacent soft
tissues was performed.

[Series 1: us soft tissue head/neck · 0.06mm/px · 14 of 55 slices shown]
[im 1/55]
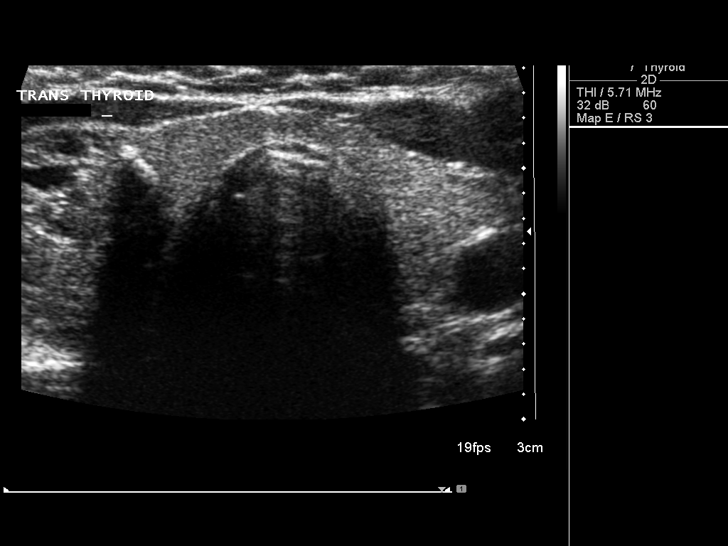
[im 5/55]
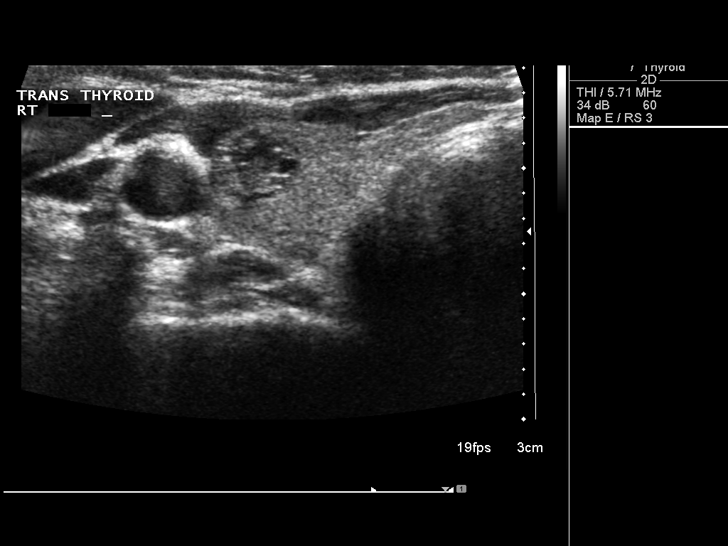
[im 10/55]
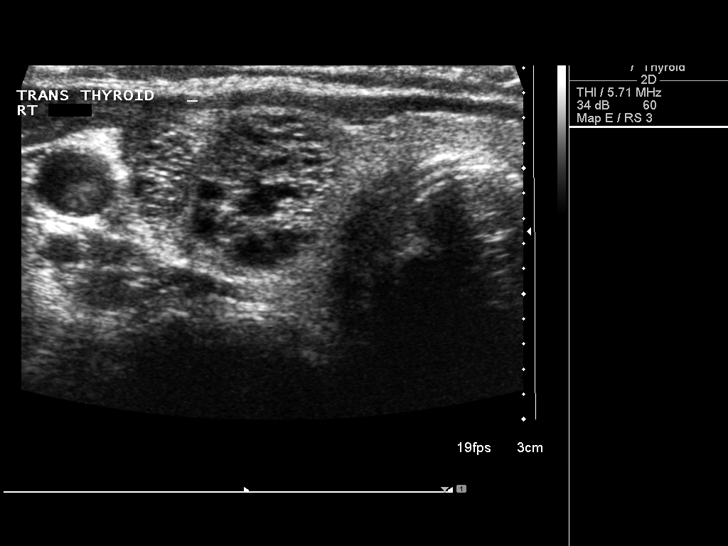
[im 14/55]
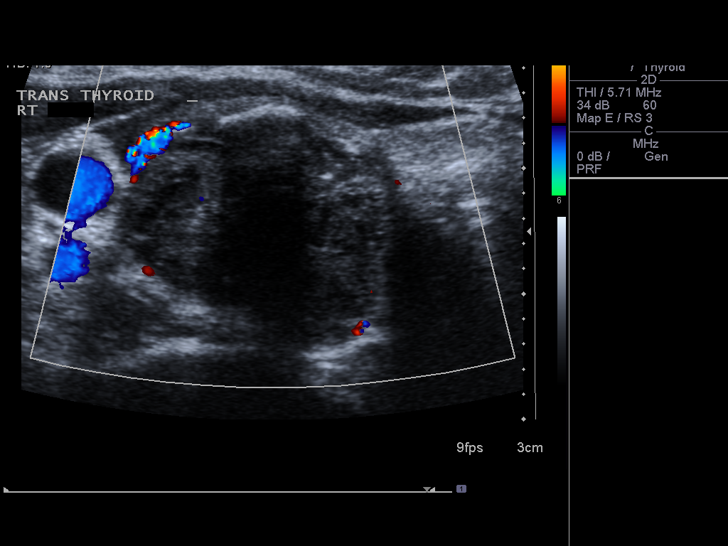
[im 19/55]
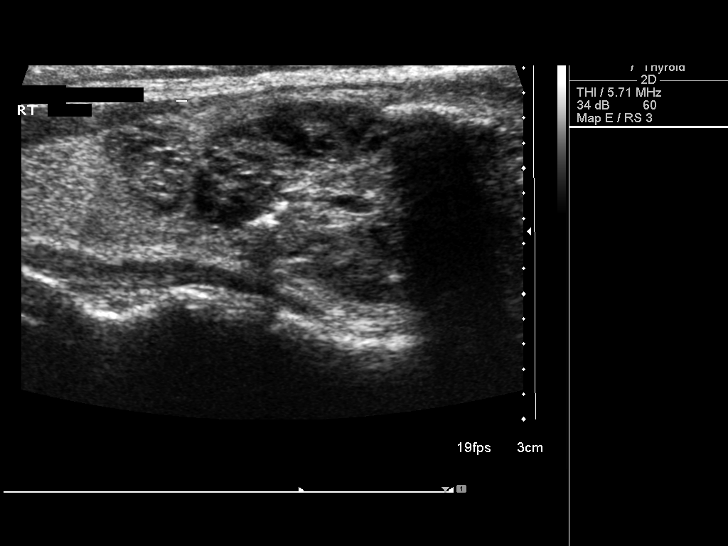
[im 21/55]
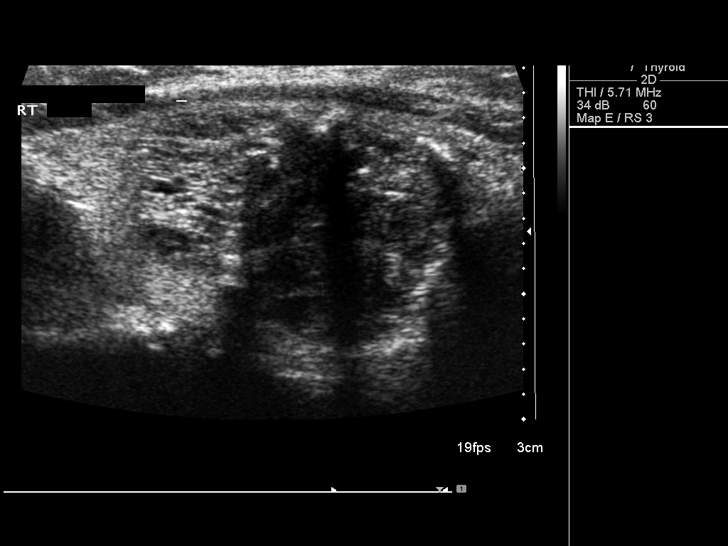
[im 25/55]
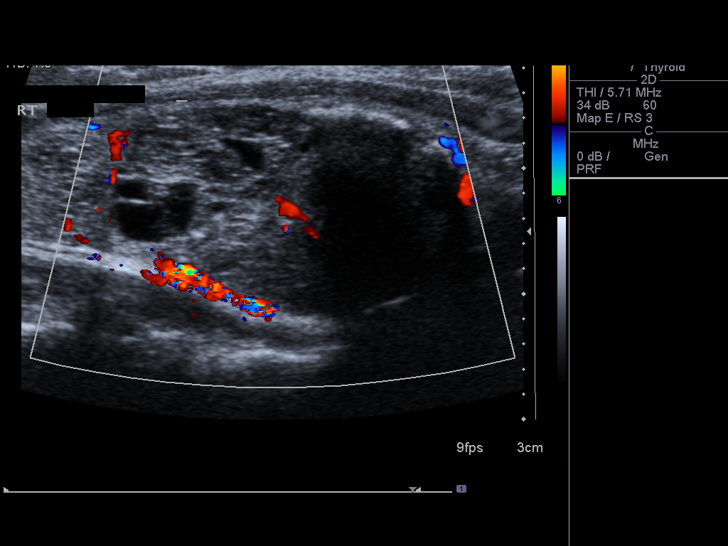
[im 30/55]
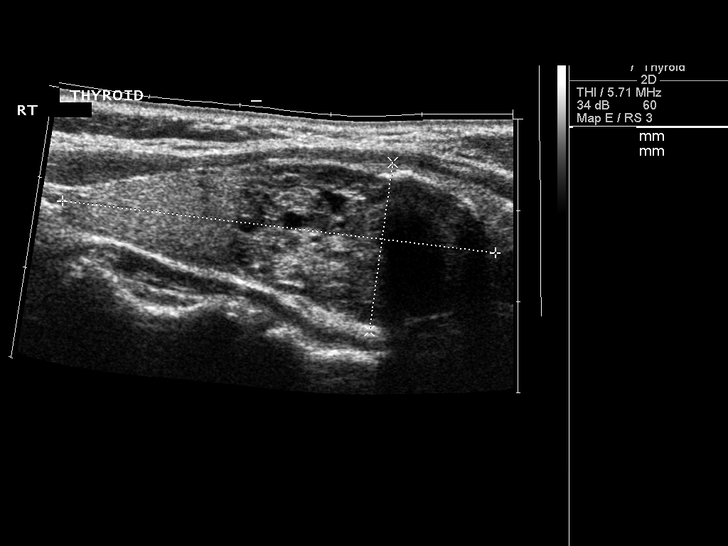
[im 34/55]
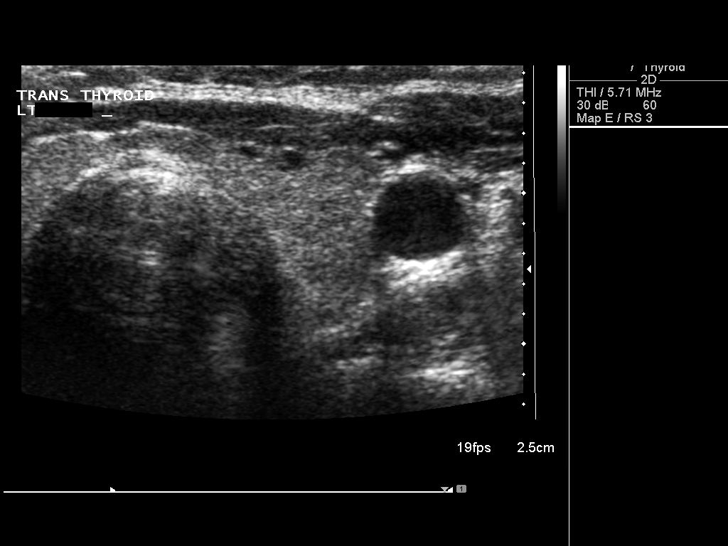
[im 37/55]
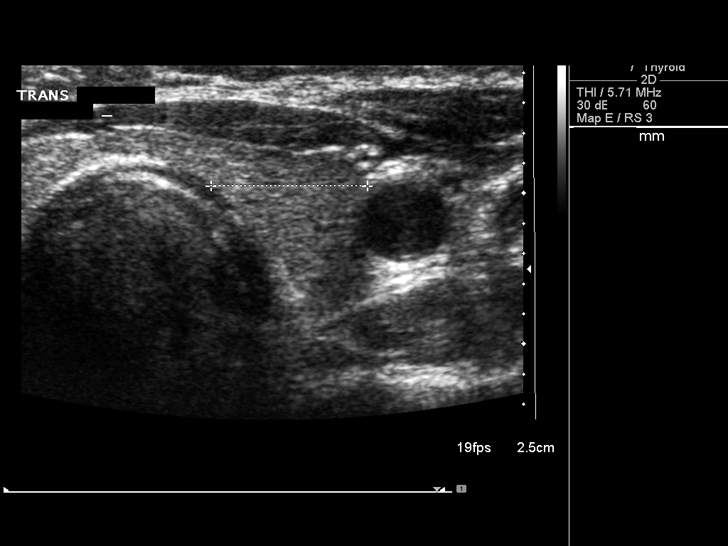
[im 41/55]
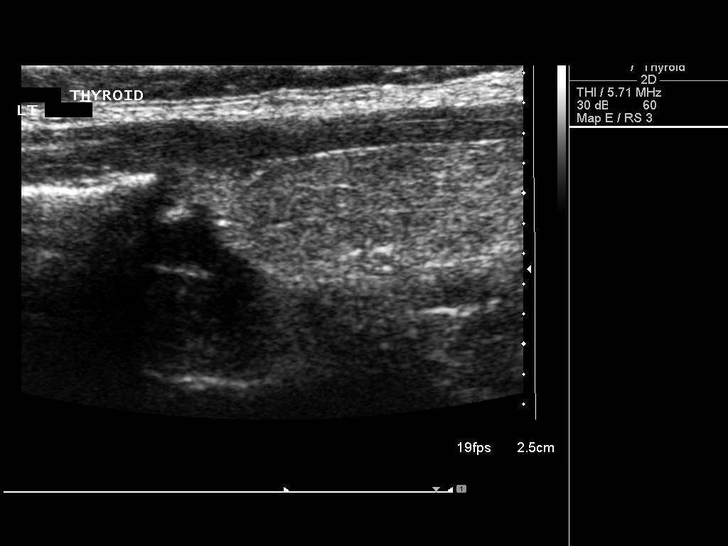
[im 46/55]
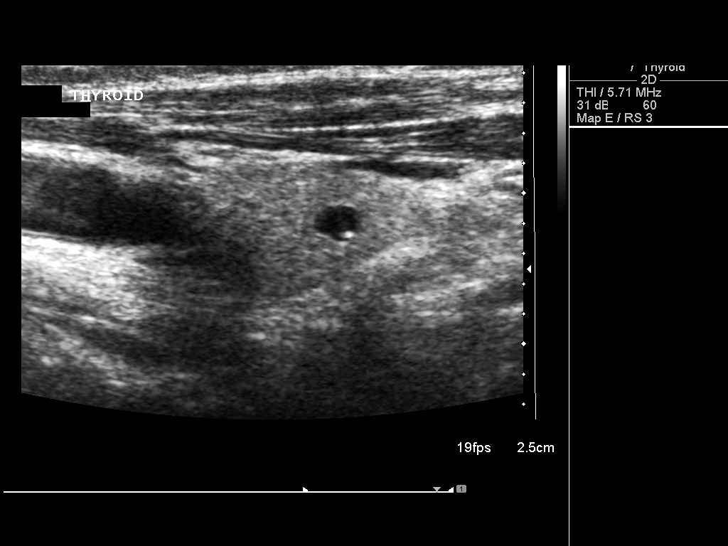
[im 50/55]
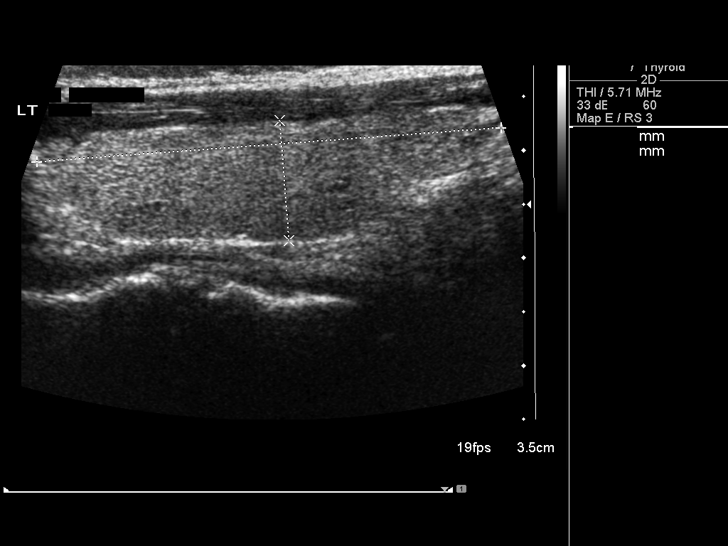
[im 55/55]
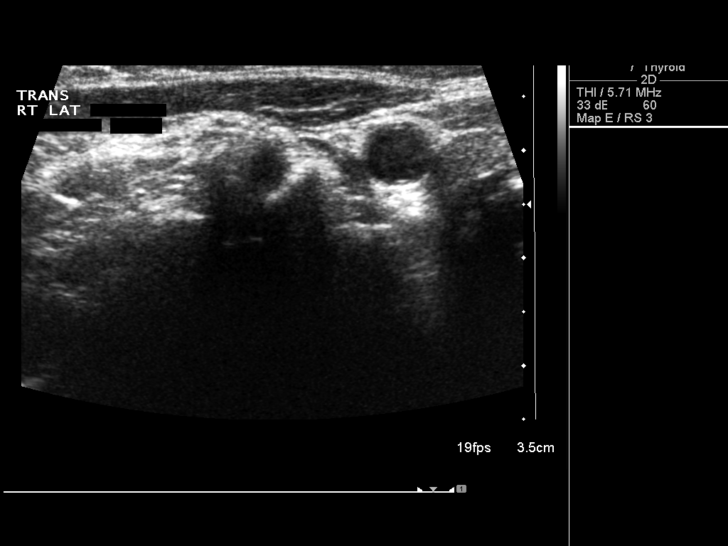

[14 of 25 positions shown; findings below may reference images not displayed]

FINDINGS: Right thyroid lobe

Measurements: 4.9 x 1.9 x 2.2 cm. Solid right lower pole nodule with
calcification measures 2.9 x 1.7 x 2.2 cm and previously measured
3.1 x 1.8 x 2.4 cm. 7 mm complex upper pole nodule previously
measured 8 mm.

Left thyroid lobe

Measurements: 4.3 x 1.1 x 1.0 cm. Tiny nodules measuring 3 mm or
less in diameter.

Isthmus

Thickness: 2 mm.  No nodules visualized.

Lymphadenopathy

None visualized.
IMPRESSION: Dominant right lower pole nodule is smaller supporting benign
etiology. Today it measures 2.9 x 1.7 x 2.2 cm.

## 2015-11-10 DIAGNOSIS — M8588 Other specified disorders of bone density and structure, other site: Secondary | ICD-10-CM | POA: Diagnosis not present

## 2015-11-14 DIAGNOSIS — M85852 Other specified disorders of bone density and structure, left thigh: Secondary | ICD-10-CM | POA: Diagnosis not present

## 2015-11-14 DIAGNOSIS — Z79890 Hormone replacement therapy (postmenopausal): Secondary | ICD-10-CM | POA: Diagnosis not present

## 2015-11-14 DIAGNOSIS — N951 Menopausal and female climacteric states: Secondary | ICD-10-CM | POA: Diagnosis not present

## 2015-12-12 ENCOUNTER — Encounter (HOSPITAL_COMMUNITY)
Admission: RE | Admit: 2015-12-12 | Discharge: 2015-12-12 | Disposition: A | Discharge status: Home / Self Care | Payer: Medicare Other | Source: Ambulatory Visit | Attending: Gastroenterology | Admitting: Gastroenterology

## 2015-12-12 DIAGNOSIS — K5090 Crohn's disease, unspecified, without complications: Secondary | ICD-10-CM | POA: Insufficient documentation

## 2015-12-12 MED ORDER — DIPHENHYDRAMINE HCL 25 MG PO CAPS
50.0000 mg | ORAL_CAPSULE | ORAL | Status: AC
  Administered 2015-12-12: 50 mg via ORAL

## 2015-12-12 MED ORDER — SODIUM CHLORIDE 0.9 % IV SOLN: INTRAVENOUS | Status: DC

## 2015-12-12 MED ORDER — SODIUM CHLORIDE 0.9 % IV SOLN
200.0000 mg | INTRAVENOUS | Status: DC
  Administered 2015-12-12: 200 mg via INTRAVENOUS
  Filled 2015-12-12: qty 20

## 2015-12-12 MED ORDER — DIPHENHYDRAMINE HCL 25 MG PO CAPS
ORAL_CAPSULE | ORAL | Status: AC
  Filled 2015-12-12: qty 2

## 2015-12-20 DIAGNOSIS — E7800 Pure hypercholesterolemia, unspecified: Secondary | ICD-10-CM | POA: Diagnosis not present

## 2015-12-20 DIAGNOSIS — E162 Hypoglycemia, unspecified: Secondary | ICD-10-CM | POA: Diagnosis not present

## 2015-12-20 DIAGNOSIS — E559 Vitamin D deficiency, unspecified: Secondary | ICD-10-CM | POA: Diagnosis not present

## 2015-12-20 DIAGNOSIS — Z130 Encounter for screening for diseases of the blood and blood-forming organs and certain disorders involving the immune mechanism: Secondary | ICD-10-CM | POA: Diagnosis not present

## 2015-12-26 DIAGNOSIS — Z79899 Other long term (current) drug therapy: Secondary | ICD-10-CM | POA: Diagnosis not present

## 2015-12-26 DIAGNOSIS — K5090 Crohn's disease, unspecified, without complications: Secondary | ICD-10-CM | POA: Diagnosis not present

## 2015-12-26 DIAGNOSIS — Z1389 Encounter for screening for other disorder: Secondary | ICD-10-CM | POA: Diagnosis not present

## 2015-12-26 DIAGNOSIS — E559 Vitamin D deficiency, unspecified: Secondary | ICD-10-CM | POA: Diagnosis not present

## 2015-12-26 DIAGNOSIS — Z8639 Personal history of other endocrine, nutritional and metabolic disease: Secondary | ICD-10-CM | POA: Diagnosis not present

## 2015-12-26 DIAGNOSIS — M722 Plantar fascial fibromatosis: Secondary | ICD-10-CM | POA: Diagnosis not present

## 2015-12-26 DIAGNOSIS — Z85828 Personal history of other malignant neoplasm of skin: Secondary | ICD-10-CM | POA: Diagnosis not present

## 2015-12-26 DIAGNOSIS — E7800 Pure hypercholesterolemia, unspecified: Secondary | ICD-10-CM | POA: Diagnosis not present

## 2015-12-26 DIAGNOSIS — Z0000 Encounter for general adult medical examination without abnormal findings: Secondary | ICD-10-CM | POA: Diagnosis not present

## 2015-12-26 DIAGNOSIS — M5090 Cervical disc disorder, unspecified, unspecified cervical region: Secondary | ICD-10-CM | POA: Diagnosis not present

## 2015-12-26 DIAGNOSIS — M8588 Other specified disorders of bone density and structure, other site: Secondary | ICD-10-CM | POA: Diagnosis not present

## 2015-12-27 DIAGNOSIS — N951 Menopausal and female climacteric states: Secondary | ICD-10-CM | POA: Diagnosis not present

## 2015-12-27 DIAGNOSIS — Z6821 Body mass index (BMI) 21.0-21.9, adult: Secondary | ICD-10-CM | POA: Diagnosis not present

## 2015-12-27 DIAGNOSIS — Z79890 Hormone replacement therapy (postmenopausal): Secondary | ICD-10-CM | POA: Diagnosis not present

## 2015-12-27 DIAGNOSIS — Z1389 Encounter for screening for other disorder: Secondary | ICD-10-CM | POA: Diagnosis not present

## 2016-01-30 DIAGNOSIS — Z79890 Hormone replacement therapy (postmenopausal): Secondary | ICD-10-CM | POA: Diagnosis not present

## 2016-01-30 DIAGNOSIS — M85852 Other specified disorders of bone density and structure, left thigh: Secondary | ICD-10-CM | POA: Diagnosis not present

## 2016-01-30 DIAGNOSIS — N951 Menopausal and female climacteric states: Secondary | ICD-10-CM | POA: Diagnosis not present

## 2016-02-13 ENCOUNTER — Ambulatory Visit (HOSPITAL_COMMUNITY)
Admission: RE | Admit: 2016-02-13 | Discharge: 2016-02-13 | Disposition: A | Discharge status: Home / Self Care | Payer: Medicare Other | Source: Ambulatory Visit | Attending: Gastroenterology | Admitting: Gastroenterology

## 2016-02-13 ENCOUNTER — Other Ambulatory Visit (HOSPITAL_COMMUNITY): Payer: Self-pay | Admitting: *Deleted

## 2016-02-13 DIAGNOSIS — Z5111 Encounter for antineoplastic chemotherapy: Secondary | ICD-10-CM | POA: Insufficient documentation

## 2016-02-13 DIAGNOSIS — K5090 Crohn's disease, unspecified, without complications: Secondary | ICD-10-CM | POA: Insufficient documentation

## 2016-02-13 MED ORDER — DIPHENHYDRAMINE HCL 25 MG PO TABS
50.0000 mg | ORAL_TABLET | ORAL | Status: DC
  Administered 2016-02-13: 50 mg via ORAL
  Filled 2016-02-13: qty 2

## 2016-02-13 MED ORDER — DIPHENHYDRAMINE HCL 25 MG PO CAPS
ORAL_CAPSULE | ORAL | Status: AC
  Filled 2016-02-13: qty 2

## 2016-02-13 MED ORDER — SODIUM CHLORIDE 0.9 % IV SOLN
INTRAVENOUS | Status: DC
  Administered 2016-02-13: 09:00:00 via INTRAVENOUS

## 2016-02-13 MED ORDER — SODIUM CHLORIDE 0.9 % IV SOLN
200.0000 mg | INTRAVENOUS | Status: DC
  Administered 2016-02-13: 200 mg via INTRAVENOUS
  Filled 2016-02-13: qty 20

## 2016-02-26 DIAGNOSIS — D225 Melanocytic nevi of trunk: Secondary | ICD-10-CM | POA: Diagnosis not present

## 2016-02-26 DIAGNOSIS — Z23 Encounter for immunization: Secondary | ICD-10-CM | POA: Diagnosis not present

## 2016-02-26 DIAGNOSIS — D485 Neoplasm of uncertain behavior of skin: Secondary | ICD-10-CM | POA: Diagnosis not present

## 2016-02-26 DIAGNOSIS — L821 Other seborrheic keratosis: Secondary | ICD-10-CM | POA: Diagnosis not present

## 2016-02-26 DIAGNOSIS — Z85828 Personal history of other malignant neoplasm of skin: Secondary | ICD-10-CM | POA: Diagnosis not present

## 2016-02-26 DIAGNOSIS — Z872 Personal history of diseases of the skin and subcutaneous tissue: Secondary | ICD-10-CM | POA: Diagnosis not present

## 2016-02-26 DIAGNOSIS — L719 Rosacea, unspecified: Secondary | ICD-10-CM | POA: Diagnosis not present

## 2016-04-11 DIAGNOSIS — Z79890 Hormone replacement therapy (postmenopausal): Secondary | ICD-10-CM | POA: Diagnosis not present

## 2016-04-11 DIAGNOSIS — N951 Menopausal and female climacteric states: Secondary | ICD-10-CM | POA: Diagnosis not present

## 2016-04-11 DIAGNOSIS — Z681 Body mass index (BMI) 19 or less, adult: Secondary | ICD-10-CM | POA: Diagnosis not present

## 2016-04-17 ENCOUNTER — Other Ambulatory Visit (HOSPITAL_COMMUNITY): Payer: Self-pay | Admitting: *Deleted

## 2016-04-18 ENCOUNTER — Ambulatory Visit (HOSPITAL_COMMUNITY)
Admission: RE | Admit: 2016-04-18 | Discharge: 2016-04-18 | Disposition: A | Discharge status: Home / Self Care | Payer: Medicare Other | Source: Ambulatory Visit | Attending: Gastroenterology | Admitting: Gastroenterology

## 2016-04-18 DIAGNOSIS — K5090 Crohn's disease, unspecified, without complications: Secondary | ICD-10-CM | POA: Insufficient documentation

## 2016-04-18 MED ORDER — DIPHENHYDRAMINE HCL 25 MG PO TABS
50.0000 mg | ORAL_TABLET | ORAL | Status: AC
  Administered 2016-04-18: 50 mg via ORAL
  Filled 2016-04-18: qty 2

## 2016-04-18 MED ORDER — SODIUM CHLORIDE 0.9 % IV SOLN
INTRAVENOUS | Status: AC
  Administered 2016-04-18: 09:00:00 via INTRAVENOUS

## 2016-04-18 MED ORDER — SODIUM CHLORIDE 0.9 % IV SOLN
200.0000 mg | INTRAVENOUS | Status: AC
  Administered 2016-04-18: 200 mg via INTRAVENOUS
  Filled 2016-04-18: qty 20

## 2016-04-18 MED ORDER — DIPHENHYDRAMINE HCL 25 MG PO CAPS
ORAL_CAPSULE | ORAL | Status: AC
  Filled 2016-04-18: qty 2

## 2016-05-20 DIAGNOSIS — D2239 Melanocytic nevi of other parts of face: Secondary | ICD-10-CM | POA: Diagnosis not present

## 2016-05-20 DIAGNOSIS — L821 Other seborrheic keratosis: Secondary | ICD-10-CM | POA: Diagnosis not present

## 2016-05-20 DIAGNOSIS — L719 Rosacea, unspecified: Secondary | ICD-10-CM | POA: Diagnosis not present

## 2016-05-22 DIAGNOSIS — K5090 Crohn's disease, unspecified, without complications: Secondary | ICD-10-CM | POA: Diagnosis not present

## 2016-06-06 DIAGNOSIS — M25561 Pain in right knee: Secondary | ICD-10-CM | POA: Diagnosis not present

## 2016-06-25 ENCOUNTER — Encounter (HOSPITAL_COMMUNITY)
Admission: RE | Admit: 2016-06-25 | Discharge: 2016-06-25 | Disposition: A | Discharge status: Home / Self Care | Payer: Medicare Other | Source: Ambulatory Visit | Attending: Gastroenterology | Admitting: Gastroenterology

## 2016-06-25 DIAGNOSIS — K5090 Crohn's disease, unspecified, without complications: Secondary | ICD-10-CM | POA: Diagnosis not present

## 2016-06-25 MED ORDER — SODIUM CHLORIDE 0.9 % IV SOLN
200.0000 mg | INTRAVENOUS | Status: DC
  Administered 2016-06-25: 200 mg via INTRAVENOUS
  Filled 2016-06-25: qty 20

## 2016-06-25 MED ORDER — DIPHENHYDRAMINE HCL 25 MG PO CAPS
ORAL_CAPSULE | ORAL | Status: AC
  Filled 2016-06-25: qty 2

## 2016-06-25 MED ORDER — SODIUM CHLORIDE 0.9 % IV SOLN
INTRAVENOUS | Status: DC
  Administered 2016-06-25: 09:00:00 via INTRAVENOUS

## 2016-06-25 MED ORDER — DIPHENHYDRAMINE HCL 25 MG PO CAPS
50.0000 mg | ORAL_CAPSULE | Freq: Once | ORAL | Status: AC
  Administered 2016-06-25: 50 mg via ORAL

## 2016-06-25 NOTE — Progress Notes (Signed)
Pt got up to go to the bathroom and when she got back in bed I noticed her face was red. When asked if she was okay She said she was hot and short of breath.  Her abdomen and chest were red and splotchy.  We stopped the remicade and did VS which were elevated from her baseline.  Anette Guarneri RN called the DR and spoke with Dr Cristina Gong who stated not to give her anymore remicade today and the office will call her to set up another appt to be seen this week.He said since she had already had prednisone at home and benadryl here in medical daycare that it was okay to let her go home.  Pt is no longer red or short of breath since the remicade has been stopped.

## 2016-06-28 DIAGNOSIS — K5090 Crohn's disease, unspecified, without complications: Secondary | ICD-10-CM | POA: Diagnosis not present

## 2016-07-05 DIAGNOSIS — Z79890 Hormone replacement therapy (postmenopausal): Secondary | ICD-10-CM | POA: Diagnosis not present

## 2016-07-05 DIAGNOSIS — N951 Menopausal and female climacteric states: Secondary | ICD-10-CM | POA: Diagnosis not present

## 2016-07-05 DIAGNOSIS — Z681 Body mass index (BMI) 19 or less, adult: Secondary | ICD-10-CM | POA: Diagnosis not present

## 2016-07-22 ENCOUNTER — Other Ambulatory Visit (HOSPITAL_COMMUNITY): Payer: Self-pay | Admitting: *Deleted

## 2016-07-23 ENCOUNTER — Ambulatory Visit (HOSPITAL_COMMUNITY)
Admission: RE | Admit: 2016-07-23 | Discharge: 2016-07-23 | Disposition: A | Discharge status: Home / Self Care | Payer: Medicare Other | Source: Ambulatory Visit | Attending: Gastroenterology | Admitting: Gastroenterology

## 2016-07-23 DIAGNOSIS — K5090 Crohn's disease, unspecified, without complications: Secondary | ICD-10-CM | POA: Insufficient documentation

## 2016-07-23 MED ORDER — PREDNISONE 20 MG PO TABS
ORAL_TABLET | ORAL | Status: AC
  Filled 2016-07-23: qty 1

## 2016-07-23 MED ORDER — SODIUM CHLORIDE 0.9 % IV SOLN
200.0000 mg | Freq: Once | INTRAVENOUS | Status: AC
  Administered 2016-07-23: 200 mg via INTRAVENOUS
  Filled 2016-07-23: qty 20

## 2016-07-23 MED ORDER — SODIUM CHLORIDE 0.9 % IV SOLN
Freq: Once | INTRAVENOUS | Status: AC
  Administered 2016-07-23: 09:00:00 via INTRAVENOUS

## 2016-07-23 MED ORDER — PREDNISONE 20 MG PO TABS: 20.0000 mg | ORAL_TABLET | Freq: Once | ORAL | Status: DC

## 2016-07-23 MED ORDER — ACETAMINOPHEN 500 MG PO TABS
ORAL_TABLET | ORAL | Status: AC
  Filled 2016-07-23: qty 1

## 2016-07-23 MED ORDER — DIPHENHYDRAMINE HCL 25 MG PO TABS
50.0000 mg | ORAL_TABLET | Freq: Once | ORAL | Status: DC
  Filled 2016-07-23: qty 2

## 2016-07-23 MED ORDER — METHYLPREDNISOLONE SODIUM SUCC 40 MG IJ SOLR
INTRAMUSCULAR | Status: AC
  Filled 2016-07-23: qty 1

## 2016-07-23 MED ORDER — METHYLPREDNISOLONE SODIUM SUCC 40 MG IJ SOLR: 20.0000 mg | Freq: Once | INTRAMUSCULAR | Status: DC

## 2016-07-23 MED ORDER — ACETAMINOPHEN 500 MG PO TABS: 500.0000 mg | ORAL_TABLET | Freq: Once | ORAL | Status: DC

## 2016-08-08 DIAGNOSIS — K5090 Crohn's disease, unspecified, without complications: Secondary | ICD-10-CM | POA: Diagnosis not present

## 2016-08-19 ENCOUNTER — Other Ambulatory Visit: Payer: Self-pay

## 2016-08-19 DIAGNOSIS — L719 Rosacea, unspecified: Secondary | ICD-10-CM | POA: Diagnosis not present

## 2016-08-26 DIAGNOSIS — K5090 Crohn's disease, unspecified, without complications: Secondary | ICD-10-CM | POA: Diagnosis not present

## 2016-09-02 DIAGNOSIS — H2513 Age-related nuclear cataract, bilateral: Secondary | ICD-10-CM | POA: Diagnosis not present

## 2016-09-24 ENCOUNTER — Other Ambulatory Visit (HOSPITAL_COMMUNITY): Payer: Self-pay | Admitting: *Deleted

## 2016-09-25 ENCOUNTER — Ambulatory Visit (HOSPITAL_COMMUNITY)
Admission: RE | Admit: 2016-09-25 | Discharge: 2016-09-25 | Disposition: A | Discharge status: Home / Self Care | Payer: Medicare Other | Source: Ambulatory Visit | Attending: Gastroenterology | Admitting: Gastroenterology

## 2016-09-25 DIAGNOSIS — K5090 Crohn's disease, unspecified, without complications: Secondary | ICD-10-CM | POA: Insufficient documentation

## 2016-09-25 MED ORDER — SODIUM CHLORIDE 0.9 % IV SOLN
Freq: Once | INTRAVENOUS | Status: AC
Start: 2016-09-25 — End: 2016-09-25
  Administered 2016-09-25: 11:00:00 via INTRAVENOUS

## 2016-09-25 MED ORDER — ACETAMINOPHEN 500 MG PO TABS: 500.0000 mg | ORAL_TABLET | ORAL | Status: DC

## 2016-09-25 MED ORDER — DIPHENHYDRAMINE HCL 25 MG PO TABS
50.0000 mg | ORAL_TABLET | Freq: Once | ORAL | Status: DC
  Filled 2016-09-25: qty 2

## 2016-09-25 MED ORDER — SODIUM CHLORIDE 0.9 % IV SOLN
200.0000 mg | Freq: Once | INTRAVENOUS | Status: AC
  Administered 2016-09-25: 200 mg via INTRAVENOUS
  Filled 2016-09-25: qty 20

## 2016-09-25 MED ORDER — PREDNISONE 20 MG PO TABS: 20.0000 mg | ORAL_TABLET | Freq: Once | ORAL | Status: DC

## 2016-10-03 DIAGNOSIS — M25512 Pain in left shoulder: Secondary | ICD-10-CM | POA: Diagnosis not present

## 2016-10-03 DIAGNOSIS — M7542 Impingement syndrome of left shoulder: Secondary | ICD-10-CM | POA: Diagnosis not present

## 2016-10-15 DIAGNOSIS — N951 Menopausal and female climacteric states: Secondary | ICD-10-CM | POA: Diagnosis not present

## 2016-10-15 DIAGNOSIS — Z79890 Hormone replacement therapy (postmenopausal): Secondary | ICD-10-CM | POA: Diagnosis not present

## 2016-10-15 DIAGNOSIS — R350 Frequency of micturition: Secondary | ICD-10-CM | POA: Diagnosis not present

## 2016-10-16 DIAGNOSIS — M6281 Muscle weakness (generalized): Secondary | ICD-10-CM | POA: Diagnosis not present

## 2016-10-16 DIAGNOSIS — M25512 Pain in left shoulder: Secondary | ICD-10-CM | POA: Diagnosis not present

## 2016-10-18 DIAGNOSIS — M25512 Pain in left shoulder: Secondary | ICD-10-CM | POA: Diagnosis not present

## 2016-10-18 DIAGNOSIS — M6281 Muscle weakness (generalized): Secondary | ICD-10-CM | POA: Diagnosis not present

## 2016-10-23 DIAGNOSIS — M25512 Pain in left shoulder: Secondary | ICD-10-CM | POA: Diagnosis not present

## 2016-10-23 DIAGNOSIS — M6281 Muscle weakness (generalized): Secondary | ICD-10-CM | POA: Diagnosis not present

## 2016-11-07 DIAGNOSIS — M7542 Impingement syndrome of left shoulder: Secondary | ICD-10-CM | POA: Diagnosis not present

## 2016-11-07 DIAGNOSIS — M25512 Pain in left shoulder: Secondary | ICD-10-CM | POA: Diagnosis not present

## 2016-11-19 DIAGNOSIS — K5090 Crohn's disease, unspecified, without complications: Secondary | ICD-10-CM | POA: Diagnosis not present

## 2016-11-20 ENCOUNTER — Other Ambulatory Visit (HOSPITAL_COMMUNITY): Payer: Self-pay

## 2016-11-21 ENCOUNTER — Ambulatory Visit (HOSPITAL_COMMUNITY)
Admission: RE | Admit: 2016-11-21 | Discharge: 2016-11-21 | Disposition: A | Discharge status: Home / Self Care | Payer: Medicare Other | Source: Ambulatory Visit | Attending: Gastroenterology | Admitting: Gastroenterology

## 2016-11-21 DIAGNOSIS — K5090 Crohn's disease, unspecified, without complications: Secondary | ICD-10-CM | POA: Insufficient documentation

## 2016-11-21 MED ORDER — ACETAMINOPHEN 500 MG PO TABS: 500.0000 mg | ORAL_TABLET | Freq: Once | ORAL | Status: DC

## 2016-11-21 MED ORDER — PREDNISONE 20 MG PO TABS: 10.0000 mg | ORAL_TABLET | Freq: Once | ORAL | Status: DC

## 2016-11-21 MED ORDER — SODIUM CHLORIDE 0.9 % IV SOLN
INTRAVENOUS | Status: DC
  Administered 2016-11-21: 09:00:00 via INTRAVENOUS

## 2016-11-21 MED ORDER — SODIUM CHLORIDE 0.9 % IV SOLN
200.0000 mg | Freq: Once | INTRAVENOUS | Status: AC
  Administered 2016-11-21: 200 mg via INTRAVENOUS
  Filled 2016-11-21: qty 20

## 2016-11-21 MED ORDER — DIPHENHYDRAMINE HCL 25 MG PO CAPS: 50.0000 mg | ORAL_CAPSULE | Freq: Once | ORAL | Status: DC

## 2016-12-12 DIAGNOSIS — K5090 Crohn's disease, unspecified, without complications: Secondary | ICD-10-CM | POA: Diagnosis not present

## 2016-12-26 ENCOUNTER — Telehealth: Payer: Self-pay | Admitting: Certified Nurse Midwife

## 2016-12-26 NOTE — Telephone Encounter (Signed)
Patient says her vaginal area is red and itchy. Last seen 02/21/14.

## 2016-12-26 NOTE — Telephone Encounter (Signed)
Spoke with patient. Patient states that she is having vaginal itching and redness. Denies any vaginal discharge. Repots this has been going on for 2 weeks. Is using vaginal estrogen. Unsure if this is caused by vaginal dryness or not. Advised will need to be seen for further evaluation. Patient is agreeable. Appointment offered for today at 11 am, but patient declines. Appointment scheduled for tomorrow 12/27/2016 at 12:45 pm with Melvia Heaps CNM. Patient is agreeable to date and time.  Routing to provider for final review. Patient agreeable to disposition. Will close encounter.

## 2016-12-27 ENCOUNTER — Encounter: Payer: Self-pay | Admitting: Certified Nurse Midwife

## 2016-12-27 ENCOUNTER — Other Ambulatory Visit: Payer: Self-pay

## 2016-12-27 ENCOUNTER — Ambulatory Visit (INDEPENDENT_AMBULATORY_CARE_PROVIDER_SITE_OTHER): Payer: Medicare Other | Admitting: Certified Nurse Midwife

## 2016-12-27 VITALS — BP 100/62 | HR 68 | Resp 16 | Ht 61.25 in | Wt 113.0 lb

## 2016-12-27 DIAGNOSIS — B373 Candidiasis of vulva and vagina: Secondary | ICD-10-CM

## 2016-12-27 DIAGNOSIS — N952 Postmenopausal atrophic vaginitis: Secondary | ICD-10-CM

## 2016-12-27 DIAGNOSIS — B3731 Acute candidiasis of vulva and vagina: Secondary | ICD-10-CM

## 2016-12-27 MED ORDER — FLUCONAZOLE 150 MG PO TABS: ORAL_TABLET | ORAL | 0 refills | Status: DC

## 2016-12-27 NOTE — Progress Notes (Signed)
71 y.o. Married Caucasian female [id number] here with complaint of vaginal symptoms of itching, and no odor or  increase discharge. Describes discharge as scant  none. Onset of symptoms 4-5 days ago. Denies new personal products or vaginal dryness.  Urinary symptoms no change, still sporadic leaking, no concerns or change..   ROS Pertinent to HPI  O:Healthy female WDWN Affect: normal, orientation x 3  Exam:Skin: warm and dry Abdomen:soft, non tender, no masses  InguinalLymph node: no enlargement or tenderness Pelvic exam: External genital: normal female with atrophic appearance, slightly increase pink BUS: negative Vagina: scant white thick discharge noted. Ph:4.0   ,Wet prep taken, atrophic appearance Cervix: normal, non tender, no CMT Uterus: normal, non tender Adnexa:normal, non tender, no masses or fullness noted   Wet Prep results:KOH, Saline + yeast, occasional. WBC   A:Normal pelvic exam Atrophic vaginitis with compounded estrogen cream use Yeast vaginitis/vulvitis   P:Discussed findings of yeast vaginitis/vulvitis and etiology. Discussed Aveeno  sitz bath for comfort. Avoid moist clothes or pads for extended period of time. If working out in gym clothes  for long periods of time change underwear Coconut Oil use for skin protection prior to activity can be used to external skin for protection or dryness. Questions addressed. Continue estrogen cream use per her routine. Can continue hydrocortisone ointment external for itching control with thin application bid x 5 days only. Rx Diflucan see order with instructions   Rv prn

## 2016-12-27 NOTE — Patient Instructions (Signed)

## 2016-12-31 DIAGNOSIS — Z79899 Other long term (current) drug therapy: Secondary | ICD-10-CM | POA: Diagnosis not present

## 2016-12-31 DIAGNOSIS — E559 Vitamin D deficiency, unspecified: Secondary | ICD-10-CM | POA: Diagnosis not present

## 2016-12-31 DIAGNOSIS — K5090 Crohn's disease, unspecified, without complications: Secondary | ICD-10-CM | POA: Diagnosis not present

## 2016-12-31 DIAGNOSIS — E7800 Pure hypercholesterolemia, unspecified: Secondary | ICD-10-CM | POA: Diagnosis not present

## 2016-12-31 DIAGNOSIS — R748 Abnormal levels of other serum enzymes: Secondary | ICD-10-CM | POA: Diagnosis not present

## 2017-01-09 DIAGNOSIS — E7800 Pure hypercholesterolemia, unspecified: Secondary | ICD-10-CM | POA: Diagnosis not present

## 2017-01-09 DIAGNOSIS — Z0000 Encounter for general adult medical examination without abnormal findings: Secondary | ICD-10-CM | POA: Diagnosis not present

## 2017-01-09 DIAGNOSIS — Z1389 Encounter for screening for other disorder: Secondary | ICD-10-CM | POA: Diagnosis not present

## 2017-01-09 DIAGNOSIS — K5090 Crohn's disease, unspecified, without complications: Secondary | ICD-10-CM | POA: Diagnosis not present

## 2017-01-09 DIAGNOSIS — M5090 Cervical disc disorder, unspecified, unspecified cervical region: Secondary | ICD-10-CM | POA: Diagnosis not present

## 2017-01-09 DIAGNOSIS — Z85828 Personal history of other malignant neoplasm of skin: Secondary | ICD-10-CM | POA: Diagnosis not present

## 2017-01-09 DIAGNOSIS — E559 Vitamin D deficiency, unspecified: Secondary | ICD-10-CM | POA: Diagnosis not present

## 2017-01-16 ENCOUNTER — Other Ambulatory Visit (HOSPITAL_COMMUNITY): Payer: Self-pay | Admitting: *Deleted

## 2017-01-16 DIAGNOSIS — H04122 Dry eye syndrome of left lacrimal gland: Secondary | ICD-10-CM | POA: Diagnosis not present

## 2017-01-16 DIAGNOSIS — H5712 Ocular pain, left eye: Secondary | ICD-10-CM | POA: Diagnosis not present

## 2017-01-17 ENCOUNTER — Ambulatory Visit (HOSPITAL_COMMUNITY)
Admission: RE | Admit: 2017-01-17 | Discharge: 2017-01-17 | Disposition: A | Discharge status: Home / Self Care | Payer: Medicare Other | Source: Ambulatory Visit | Attending: Gastroenterology | Admitting: Gastroenterology

## 2017-01-17 DIAGNOSIS — K5090 Crohn's disease, unspecified, without complications: Secondary | ICD-10-CM | POA: Insufficient documentation

## 2017-01-17 MED ORDER — SODIUM CHLORIDE 0.9 % IV SOLN
200.0000 mg | INTRAVENOUS | Status: DC
  Administered 2017-01-17: 200 mg via INTRAVENOUS
  Filled 2017-01-17: qty 20

## 2017-01-17 MED ORDER — PREDNISONE 20 MG PO TABS: 20.0000 mg | ORAL_TABLET | ORAL | Status: DC

## 2017-01-17 MED ORDER — DIPHENHYDRAMINE HCL 25 MG PO CAPS: 50.0000 mg | ORAL_CAPSULE | ORAL | Status: DC

## 2017-01-17 MED ORDER — SODIUM CHLORIDE 0.9 % IV SOLN
INTRAVENOUS | Status: DC
  Administered 2017-01-17: 10:00:00 via INTRAVENOUS

## 2017-01-17 MED ORDER — ACETAMINOPHEN 500 MG PO TABS: 500.0000 mg | ORAL_TABLET | ORAL | Status: DC

## 2017-02-18 DIAGNOSIS — N952 Postmenopausal atrophic vaginitis: Secondary | ICD-10-CM | POA: Diagnosis not present

## 2017-02-18 DIAGNOSIS — Z79890 Hormone replacement therapy (postmenopausal): Secondary | ICD-10-CM | POA: Diagnosis not present

## 2017-02-18 DIAGNOSIS — N951 Menopausal and female climacteric states: Secondary | ICD-10-CM | POA: Diagnosis not present

## 2017-02-18 DIAGNOSIS — Z712 Person consulting for explanation of examination or test findings: Secondary | ICD-10-CM | POA: Diagnosis not present

## 2017-02-24 DIAGNOSIS — Z872 Personal history of diseases of the skin and subcutaneous tissue: Secondary | ICD-10-CM | POA: Diagnosis not present

## 2017-02-24 DIAGNOSIS — Z23 Encounter for immunization: Secondary | ICD-10-CM | POA: Diagnosis not present

## 2017-02-24 DIAGNOSIS — Z85828 Personal history of other malignant neoplasm of skin: Secondary | ICD-10-CM | POA: Diagnosis not present

## 2017-02-24 DIAGNOSIS — L821 Other seborrheic keratosis: Secondary | ICD-10-CM | POA: Diagnosis not present

## 2017-02-24 DIAGNOSIS — L719 Rosacea, unspecified: Secondary | ICD-10-CM | POA: Diagnosis not present

## 2017-02-24 DIAGNOSIS — D225 Melanocytic nevi of trunk: Secondary | ICD-10-CM | POA: Diagnosis not present

## 2017-02-24 DIAGNOSIS — L720 Epidermal cyst: Secondary | ICD-10-CM | POA: Diagnosis not present

## 2017-03-17 ENCOUNTER — Other Ambulatory Visit (HOSPITAL_COMMUNITY): Payer: Self-pay | Admitting: *Deleted

## 2017-03-17 ENCOUNTER — Encounter (HOSPITAL_COMMUNITY)
Admission: RE | Admit: 2017-03-17 | Discharge: 2017-03-17 | Disposition: A | Discharge status: Home / Self Care | Payer: Medicare Other | Source: Ambulatory Visit | Attending: Gastroenterology | Admitting: Gastroenterology

## 2017-03-17 DIAGNOSIS — K5090 Crohn's disease, unspecified, without complications: Secondary | ICD-10-CM | POA: Diagnosis not present

## 2017-03-17 MED ORDER — SODIUM CHLORIDE 0.9 % IV SOLN
200.0000 mg | INTRAVENOUS | Status: AC
  Administered 2017-03-17: 200 mg via INTRAVENOUS
  Filled 2017-03-17: qty 20

## 2017-03-17 MED ORDER — DIPHENHYDRAMINE HCL 25 MG PO CAPS: 50.0000 mg | ORAL_CAPSULE | ORAL | Status: DC

## 2017-03-17 MED ORDER — SODIUM CHLORIDE 0.9 % IV SOLN
INTRAVENOUS | Status: AC
  Administered 2017-03-17: 09:00:00 via INTRAVENOUS

## 2017-03-17 MED ORDER — PREDNISONE 20 MG PO TABS: 20.0000 mg | ORAL_TABLET | ORAL | Status: DC

## 2017-03-17 MED ORDER — ACETAMINOPHEN 500 MG PO TABS
500.0000 mg | ORAL_TABLET | ORAL | Status: DC
Start: 2017-03-17 — End: 2017-03-18

## 2017-05-06 DIAGNOSIS — K5090 Crohn's disease, unspecified, without complications: Secondary | ICD-10-CM | POA: Diagnosis not present

## 2017-05-12 ENCOUNTER — Other Ambulatory Visit (HOSPITAL_COMMUNITY): Payer: Self-pay | Admitting: *Deleted

## 2017-05-13 ENCOUNTER — Ambulatory Visit (HOSPITAL_COMMUNITY)
Admission: RE | Admit: 2017-05-13 | Discharge: 2017-05-13 | Disposition: A | Discharge status: Home / Self Care | Payer: Medicare Other | Source: Ambulatory Visit | Attending: Gastroenterology | Admitting: Gastroenterology

## 2017-05-13 DIAGNOSIS — K5090 Crohn's disease, unspecified, without complications: Secondary | ICD-10-CM | POA: Diagnosis not present

## 2017-05-13 MED ORDER — SODIUM CHLORIDE 0.9 % IV SOLN
INTRAVENOUS | Status: DC
  Administered 2017-05-13: 10:00:00 via INTRAVENOUS

## 2017-05-13 MED ORDER — ACETAMINOPHEN 500 MG PO TABS: 500.0000 mg | ORAL_TABLET | Freq: Once | ORAL | Status: DC

## 2017-05-13 MED ORDER — PREDNISONE 20 MG PO TABS: 20.0000 mg | ORAL_TABLET | Freq: Once | ORAL | Status: DC

## 2017-05-13 MED ORDER — SODIUM CHLORIDE 0.9 % IV SOLN
200.0000 mg | INTRAVENOUS | Status: DC
  Administered 2017-05-13: 200 mg via INTRAVENOUS
  Filled 2017-05-13: qty 20

## 2017-05-13 MED ORDER — DIPHENHYDRAMINE HCL 25 MG PO TABS
50.0000 mg | ORAL_TABLET | Freq: Once | ORAL | Status: DC
  Filled 2017-05-13: qty 2

## 2017-05-20 DIAGNOSIS — N951 Menopausal and female climacteric states: Secondary | ICD-10-CM | POA: Diagnosis not present

## 2017-05-20 DIAGNOSIS — L719 Rosacea, unspecified: Secondary | ICD-10-CM | POA: Diagnosis not present

## 2017-05-20 DIAGNOSIS — Z1389 Encounter for screening for other disorder: Secondary | ICD-10-CM | POA: Diagnosis not present

## 2017-05-20 DIAGNOSIS — Z79890 Hormone replacement therapy (postmenopausal): Secondary | ICD-10-CM | POA: Diagnosis not present

## 2017-06-16 DIAGNOSIS — J342 Deviated nasal septum: Secondary | ICD-10-CM | POA: Diagnosis not present

## 2017-06-16 DIAGNOSIS — H6982 Other specified disorders of Eustachian tube, left ear: Secondary | ICD-10-CM | POA: Diagnosis not present

## 2017-06-16 DIAGNOSIS — J3489 Other specified disorders of nose and nasal sinuses: Secondary | ICD-10-CM | POA: Diagnosis not present

## 2017-07-03 DIAGNOSIS — H6982 Other specified disorders of Eustachian tube, left ear: Secondary | ICD-10-CM | POA: Diagnosis not present

## 2017-07-03 DIAGNOSIS — J342 Deviated nasal septum: Secondary | ICD-10-CM | POA: Diagnosis not present

## 2017-07-08 ENCOUNTER — Other Ambulatory Visit (HOSPITAL_COMMUNITY): Payer: Self-pay | Admitting: *Deleted

## 2017-07-09 ENCOUNTER — Encounter (HOSPITAL_COMMUNITY)
Admission: RE | Admit: 2017-07-09 | Discharge: 2017-07-09 | Disposition: A | Discharge status: Home / Self Care | Payer: Medicare Other | Source: Ambulatory Visit | Attending: Gastroenterology | Admitting: Gastroenterology

## 2017-07-09 DIAGNOSIS — K5090 Crohn's disease, unspecified, without complications: Secondary | ICD-10-CM | POA: Insufficient documentation

## 2017-07-09 MED ORDER — SODIUM CHLORIDE 0.9 % IV SOLN
Freq: Once | INTRAVENOUS | Status: AC
  Administered 2017-07-09: 09:00:00 via INTRAVENOUS

## 2017-07-09 MED ORDER — ACETAMINOPHEN 500 MG PO TABS: 500.0000 mg | ORAL_TABLET | Freq: Once | ORAL | Status: DC

## 2017-07-09 MED ORDER — PREDNISONE 20 MG PO TABS: 20.0000 mg | ORAL_TABLET | Freq: Once | ORAL | Status: DC

## 2017-07-09 MED ORDER — SODIUM CHLORIDE 0.9 % IV SOLN
200.0000 mg | Freq: Once | INTRAVENOUS | Status: AC
  Administered 2017-07-09: 200 mg via INTRAVENOUS
  Filled 2017-07-09: qty 20

## 2017-07-09 MED ORDER — DIPHENHYDRAMINE HCL 25 MG PO CAPS: 50.0000 mg | ORAL_CAPSULE | Freq: Once | ORAL | Status: DC

## 2017-07-22 DIAGNOSIS — Z79899 Other long term (current) drug therapy: Secondary | ICD-10-CM | POA: Diagnosis not present

## 2017-07-22 LAB — HEPATIC FUNCTION PANEL
ALT: 16 (ref 7–35)
AST: 17 (ref 13–35)
Alkaline Phosphatase: 90 (ref 25–125)

## 2017-09-04 ENCOUNTER — Other Ambulatory Visit (HOSPITAL_COMMUNITY): Payer: Self-pay

## 2017-09-05 ENCOUNTER — Encounter (HOSPITAL_COMMUNITY)
Admission: RE | Admit: 2017-09-05 | Discharge: 2017-09-05 | Disposition: A | Discharge status: Home / Self Care | Payer: Medicare Other | Source: Ambulatory Visit | Attending: Gastroenterology | Admitting: Gastroenterology

## 2017-09-05 DIAGNOSIS — K5090 Crohn's disease, unspecified, without complications: Secondary | ICD-10-CM | POA: Diagnosis not present

## 2017-09-05 MED ORDER — SODIUM CHLORIDE 0.9 % IV SOLN
200.0000 mg | INTRAVENOUS | Status: DC
  Administered 2017-09-05: 200 mg via INTRAVENOUS
  Filled 2017-09-05: qty 20

## 2017-09-05 MED ORDER — ACETAMINOPHEN 500 MG PO TABS: 500.0000 mg | ORAL_TABLET | Freq: Once | ORAL | Status: DC

## 2017-09-05 MED ORDER — DIPHENHYDRAMINE HCL 25 MG PO CAPS: 50.0000 mg | ORAL_CAPSULE | Freq: Once | ORAL | Status: DC

## 2017-09-05 MED ORDER — SODIUM CHLORIDE 0.9 % IV SOLN
INTRAVENOUS | Status: DC
  Administered 2017-09-05: 10:00:00 via INTRAVENOUS

## 2017-09-05 MED ORDER — PREDNISONE 20 MG PO TABS: 20.0000 mg | ORAL_TABLET | Freq: Once | ORAL | Status: DC

## 2017-09-05 NOTE — Progress Notes (Signed)
IV TEAM: Arrived to short stay, RN reported she was able to start IV. Our services are not needed.

## 2017-09-08 DIAGNOSIS — H2513 Age-related nuclear cataract, bilateral: Secondary | ICD-10-CM | POA: Diagnosis not present

## 2017-09-23 DIAGNOSIS — K5090 Crohn's disease, unspecified, without complications: Secondary | ICD-10-CM | POA: Diagnosis not present

## 2017-10-09 DIAGNOSIS — M8589 Other specified disorders of bone density and structure, multiple sites: Secondary | ICD-10-CM | POA: Diagnosis not present

## 2017-10-09 LAB — HM DEXA SCAN

## 2017-10-15 ENCOUNTER — Encounter: Payer: Self-pay | Admitting: Certified Nurse Midwife

## 2017-10-15 ENCOUNTER — Telehealth: Payer: Self-pay | Admitting: Certified Nurse Midwife

## 2017-10-15 ENCOUNTER — Ambulatory Visit (INDEPENDENT_AMBULATORY_CARE_PROVIDER_SITE_OTHER): Payer: Medicare Other | Admitting: Certified Nurse Midwife

## 2017-10-15 ENCOUNTER — Other Ambulatory Visit: Payer: Self-pay

## 2017-10-15 VITALS — BP 100/70 | HR 68 | Resp 16 | Ht 61.25 in | Wt 112.0 lb

## 2017-10-15 DIAGNOSIS — N6310 Unspecified lump in the right breast, unspecified quadrant: Secondary | ICD-10-CM

## 2017-10-15 NOTE — Telephone Encounter (Signed)
Patient returned call. Patient states her right breast started feeling painful last night and she woke up this morning and she noticed a rash around the nipple and "tiny bumps." Patient denies fever or discharge from breast. RN advised OV recommended for further evaluation. Patient scheduled for 10-15-17 at 1600 with Melvia Heaps, CNM. Patient agreeable to date and time of appointment.   Routing to provider for final review. Patient agreeable to disposition. Will close encounter.

## 2017-10-15 NOTE — Telephone Encounter (Signed)
Patient called and stated that she developed a painful rash around her right nipple. Patient stated that there are bumps. Patient is questioning if she should use Cortisone creme on it, or if she should come in to be seen.

## 2017-10-15 NOTE — Telephone Encounter (Signed)
Message left to return call to Triage Nurse at [phone].    

## 2017-10-15 NOTE — Progress Notes (Signed)
   Subjective:   72 y.o. Married Caucasian female presents for evaluation of right breast ? Rash and tenderness. Onset of the symptoms was last 24 hours. Patient sought evaluation because of breast tenderness and ? rash around nipple on right breast.  Contributing factors include none. Denies chills, or fevers.. Patient denies hiistory of trauma, bites, or injuries. Last mammogram was years ago with silcone implant removal after bilateral rupture from mammogram. Patient has not used any new lotions or products. No new bras or clothing. Review of Systems Pertinent items noted in HPI and remainder of comprehensive ROS otherwise negative.   Objective:   General appearance: alert, cooperative and appears stated age Breasts: normal appearance, no masses or tenderness, No nipple discharge or bleeding, No axillary or supraclavicular adenopathy, right nipple inverted with ? age related skin change, no rash noted, tender to touch, Pea size ? mass noted under nipple on right only.   Assessment:   ASSESSMENT:Patient is diagnosed with right breast ?mass and tenderness.  History of bilateral silicone breast implant rupture with mammogram and removal.  Plan:   PLAN: Discussed finding with patient and recommend [place] of breast to determine if concerns with tenderness and ? Mass palpated. Patient agreeable to [place] only, will consider mammogram only after US done. Discussed importance of evaluation. Patient voiced understanding. Will be scheduled prior to leaving today with [facility].  Rv prn

## 2017-10-15 NOTE — Progress Notes (Signed)
Patient scheduled while in office for right breast US at [facility] on 10-22-17. Patient agreeable to date and time of appointment. Will fax order to [facility].

## 2017-10-22 DIAGNOSIS — N6001 Solitary cyst of right breast: Secondary | ICD-10-CM | POA: Diagnosis not present

## 2017-10-22 DIAGNOSIS — N644 Mastodynia: Secondary | ICD-10-CM | POA: Diagnosis not present

## 2017-10-31 ENCOUNTER — Encounter (HOSPITAL_COMMUNITY): Payer: Medicare Other

## 2017-11-03 ENCOUNTER — Ambulatory Visit (HOSPITAL_COMMUNITY)
Admission: RE | Admit: 2017-11-03 | Discharge: 2017-11-03 | Disposition: A | Discharge status: Home / Self Care | Payer: Medicare Other | Source: Ambulatory Visit | Attending: Gastroenterology | Admitting: Gastroenterology

## 2017-11-03 DIAGNOSIS — K5090 Crohn's disease, unspecified, without complications: Secondary | ICD-10-CM | POA: Diagnosis not present

## 2017-11-03 MED ORDER — SODIUM CHLORIDE 0.9 % IV SOLN
200.0000 mg | INTRAVENOUS | Status: DC
  Administered 2017-11-03: 200 mg via INTRAVENOUS
  Filled 2017-11-03: qty 20

## 2017-11-03 MED ORDER — DIPHENHYDRAMINE HCL 25 MG PO CAPS: 50.0000 mg | ORAL_CAPSULE | Freq: Once | ORAL | Status: DC

## 2017-11-03 MED ORDER — PREDNISONE 20 MG PO TABS: 20.0000 mg | ORAL_TABLET | Freq: Once | ORAL | Status: DC

## 2017-11-03 MED ORDER — SODIUM CHLORIDE 0.9 % IV SOLN
INTRAVENOUS | Status: DC
  Administered 2017-11-03: 10:00:00 via INTRAVENOUS

## 2017-11-03 MED ORDER — ACETAMINOPHEN 500 MG PO TABS: 500.0000 mg | ORAL_TABLET | Freq: Once | ORAL | Status: DC

## 2017-11-11 ENCOUNTER — Encounter: Payer: Self-pay | Admitting: Obstetrics & Gynecology

## 2017-11-11 ENCOUNTER — Ambulatory Visit (INDEPENDENT_AMBULATORY_CARE_PROVIDER_SITE_OTHER): Payer: Medicare Other | Admitting: Obstetrics & Gynecology

## 2017-11-11 VITALS — BP 112/74 | Ht 60.5 in | Wt 111.0 lb

## 2017-11-11 DIAGNOSIS — M8580 Other specified disorders of bone density and structure, unspecified site: Secondary | ICD-10-CM

## 2017-11-11 DIAGNOSIS — Z01419 Encounter for gynecological examination (general) (routine) without abnormal findings: Secondary | ICD-10-CM

## 2017-11-11 DIAGNOSIS — Z79890 Hormone replacement therapy (postmenopausal): Secondary | ICD-10-CM

## 2017-11-11 NOTE — Progress Notes (Signed)
Cassandra Herring [birth year] [id number]   History:    72 y.o. G0  Married  RP: New patient presenting for annual gyn exam   HPI: Menopause on HRT x 18 yrs.  Using systemic Estrogen/Progesterone gel per patient's description.  Stayed on it mainly for the Bone benefit.  No PMB. No pelvic pain.  Using compound Estrogen cream vaginally 2-3 times a week.  Feels that it helped prevent progression of her Urethrocele.  Rarely sexually active.  H/O Silicone breast implants which ruptured and were removed.  Seen on 10/15/17 for a Rt breast pain/small lump behind nipple/nipple inversion.  Will obtain report of Rt Dx mammo/US from [facility].  Per patient, it was scar tissue.  BMI 21.32.  Walking regularly.  Taking Vit D and Ca++.  Per patient, long standing Osteopenia.  Will obtain latest BD report from [place].  Health labs with Fam MD.  Crohn's disease stable.  Past medical history,surgical history, family history and social history were all reviewed and documented in the EPIC chart.  Gynecologic History Patient's last menstrual period was 01/29/1999. Contraception: post menopausal status Last Pap: 20 yrs ago, always normal Last mammogram: Rt breast Dx mammo/US.  Will obtain report from [place]. Bone Density: Will obtain report from [place]. Colonoscopy: 2 yrs ago.  Obstetric History OB History  Gravida Para Term Preterm AB Living  0 0 0 0 0 0  SAB TAB Ectopic Multiple Live Births  0 0 0 0       ROS: A ROS was performed and pertinent positives and negatives are included in the history.  GENERAL: No fevers or chills. HEENT: No change in vision, no earache, sore throat or sinus congestion. NECK: No pain or stiffness. CARDIOVASCULAR: No chest pain or pressure. No palpitations. PULMONARY: No shortness of breath, cough or wheeze. GASTROINTESTINAL: No abdominal pain, nausea, vomiting or diarrhea, melena or bright red blood per rectum. GENITOURINARY: No urinary frequency, urgency, hesitancy or dysuria.  MUSCULOSKELETAL: No joint or muscle pain, no back pain, no recent trauma. DERMATOLOGIC: No rash, no itching, no lesions. ENDOCRINE: No polyuria, polydipsia, no heat or cold intolerance. No recent change in weight. HEMATOLOGICAL: No anemia or easy bruising or bleeding. NEUROLOGIC: No headache, seizures, numbness, tingling or weakness. PSYCHIATRIC: No depression, no loss of interest in normal activity or change in sleep pattern.     Exam:   BP 112/74   Ht 5' 0.5" (1.537 m)   Wt 111 lb (50.3 kg)   LMP 01/29/1999   BMI 21.32 kg/m   Body mass index is 21.32 kg/m.  General appearance : Well developed well nourished female. No acute distress HEENT: Eyes: no retinal hemorrhage or exudates,  Neck supple, trachea midline, no carotid bruits, no thyroidmegaly Lungs: Clear to auscultation, no rhonchi or wheezes, or rib retractions  Heart: Regular rate and rhythm, no murmurs or gallops Breast:Examined in sitting and supine position were symmetrical in appearance, no palpable masses or tenderness,  no skin retraction, no nipple inversion, no nipple discharge, no skin discoloration, no axillary or supraclavicular lymphadenopathy Abdomen: no palpable masses or tenderness, no rebound or guarding Extremities: no edema or skin discoloration or tenderness  Pelvic: Vulva: Normal.  No urethrocele seen.             Vagina: No gross lesions or discharge  Cervix: No gross lesions or discharge  Uterus  AV, normal size, shape and consistency, non-tender and mobile  Adnexa  Without masses or tenderness  Anus: Normal   Assessment/Plan:  72 y.o. female for annual exam   1. Well female exam with routine gynecological exam Gynecologic exam normal in menopause.  History of normal Pap tests, no indication to repeat Pap test at this time.  Breast exam normal.  Will obtain report of recent right breast diagnostic mammogram and ultrasound.  Health labs with family physician.  2. Post-menopause on HRT (hormone  replacement therapy) Menopause on HRT x 18 yrs.  Using systemic Estrogen/Progesterone gel per patient's description.  Stayed on it mainly for the Bone benefit.  Strongly recommended to stop hormone replacement therapy at this time.  Her risks of breast cancer and stroke outweigh the benefits.  Using estrogen cream vaginally, feels that this helps prevent progression of a urethrocele.  Recommend not taking more than a quarter of an applicator twice a week.  3. Osteopenia, unspecified location Long-standing osteopenia per patient.  Will obtain latest bone density from [place].  Vitamin D supplements, calcium intake of 1.5 g/day and regular weightbearing physical activity recommended.  Princess Bruins MD, 10:11 AM 11/11/2017

## 2017-11-12 ENCOUNTER — Encounter: Payer: Self-pay | Admitting: Certified Nurse Midwife

## 2017-11-16 ENCOUNTER — Encounter: Payer: Self-pay | Admitting: Obstetrics & Gynecology

## 2017-11-16 NOTE — Patient Instructions (Signed)
1. Well female exam with routine gynecological exam Gynecologic exam normal in menopause.  History of normal Pap tests, no indication to repeat Pap test at this time.  Breast exam normal.  Will obtain report of recent right breast diagnostic mammogram and ultrasound.  Health labs with family physician.  2. Post-menopause on HRT (hormone replacement therapy) Menopause on HRT x 18 yrs.  Using systemic Estrogen/Progesterone gel per patient's description.  Stayed on it mainly for the Bone benefit.  Strongly recommended to stop hormone replacement therapy at this time.  Her risks of breast cancer and stroke outweigh the benefits.  Using estrogen cream vaginally, feels that this helps prevent progression of a urethrocele.  Recommend not taking more than a quarter of an applicator twice a week.  3. Osteopenia, unspecified location Long-standing osteopenia per patient.  Will obtain latest bone density from [place].  Vitamin D supplements, calcium intake of 1.5 g/day and regular weightbearing physical activity recommended.  Annaleigha, this was a pleasure meeting you today!

## 2017-11-24 ENCOUNTER — Encounter: Payer: Self-pay | Admitting: Internal Medicine

## 2017-11-24 DIAGNOSIS — L719 Rosacea, unspecified: Secondary | ICD-10-CM

## 2017-11-25 ENCOUNTER — Ambulatory Visit (INDEPENDENT_AMBULATORY_CARE_PROVIDER_SITE_OTHER): Payer: Medicare Other | Admitting: Internal Medicine

## 2017-11-25 ENCOUNTER — Encounter: Payer: Self-pay | Admitting: Internal Medicine

## 2017-11-25 VITALS — BP 120/76 | HR 66 | Temp 97.9°F | Ht 60.5 in | Wt 112.0 lb

## 2017-11-25 DIAGNOSIS — Z712 Person consulting for explanation of examination or test findings: Secondary | ICD-10-CM | POA: Diagnosis not present

## 2017-11-25 DIAGNOSIS — N951 Menopausal and female climacteric states: Secondary | ICD-10-CM | POA: Diagnosis not present

## 2017-11-25 DIAGNOSIS — M85852 Other specified disorders of bone density and structure, left thigh: Secondary | ICD-10-CM | POA: Diagnosis not present

## 2017-11-25 DIAGNOSIS — R413 Other amnesia: Secondary | ICD-10-CM | POA: Diagnosis not present

## 2017-11-25 NOTE — Patient Instructions (Signed)

## 2017-11-25 NOTE — Progress Notes (Signed)
Subjective:     Patient ID: Cassandra Herring , female    DOB: May 12, 1945 , 72 y.o.   MRN: [id number]   Chief Complaint  Patient presents with  . hormones f/u    HPI  SHE IS HERE TODAY FOR F/U BHRT. SHE FEELS WELL ON HER CURRENT REGIMEN. SHE IS NOW READY TO START TO WEAN OFF OF THE MEDICATION. SHE REPORTS THAT SHE FEELS LIKE NOW IS A GOOD TIME TO TRY TO COME OFF OF THERAPY. SHE IS UP TO DATE WITH HER MAMMOGRAM.    Past Medical History:  Diagnosis Date  . Anemia   . Basal cell carcinoma   . Bursitis    left shoulder   . Crohn disease ([place])   . HNP (herniated nucleus pulposus)   . Mass    gluteal  . Osteopenia   . Partial tear of left subscapularis tendon   . STD (sexually transmitted disease)    Hx of genital warts  . Tendon tear    shoulder  . Thyroid nodule   . Vertigo   . Vitamin D deficiency       Current Outpatient Medications:  .  Ascorbic Acid (VITAMIN C) 1000 MG tablet, Take 1,000 mg by mouth daily., Disp: , Rfl:  .  BORON PO, Take by mouth., Disp: , Rfl:  .  CALCIUM PO, Take by mouth daily., Disp: , Rfl:  .  Cholecalciferol (VITAMIN D) 2000 UNITS tablet, Take 2,000 Units by mouth 2 (two) times daily. , Disp: , Rfl:  .  COPPER PO, Take by mouth., Disp: , Rfl:  .  DHEA 10 MG CAPS, Take by mouth., Disp: , Rfl:  .  EVENING PRIMROSE OIL PO, Take by mouth as needed., Disp: , Rfl:  .  inFLIXimab (REMICADE) 100 MG injection, Inject 200 mg into the vein. Every 8-10 weeks, Disp: , Rfl:  .  MANGANESE PO, Take by mouth., Disp: , Rfl:  .  NONFORMULARY OR COMPOUNDED ITEM, daily. Biest/ estradiol 0.1-0.025, Disp: , Rfl:  .  Omega-3 Fatty Acids (FISH OIL PO), Take by mouth as needed., Disp: , Rfl:  .  UNABLE TO FIND, Progesterone 100mg  compound once daily, Disp: , Rfl:  .  VITAMIN K PO, Take by mouth., Disp: , Rfl:  .  Zinc Sulfate (ZINC 15 PO), Take by mouth., Disp: , Rfl:    Allergies  Allergen Reactions  . Humira [Adalimumab] Rash    Raised persistent rash around  the injection site.  . Methylprednisolone Rash  . Remicade [Infliximab] Hives, Rash and Other (See Comments)    Tightening of chest. Per patient pre treatment requires steroids, benadryl and a low dose of remicade.     Review of Systems  Constitutional: Negative.   HENT: Negative.   Respiratory: Negative.   Cardiovascular: Negative.   Gastrointestinal: Negative.   Endocrine: Negative.   Neurological: Negative.        SHE C/O OCC. MEMORY ISSUES. SHE IS NOT SURE WHAT CONTRIBUTES TO HER SX. SOMETIMES HAS ISSUES WITH FINDING THE RIGHT WORD.   Hematological: Negative.   Psychiatric/Behavioral: Negative.      Today's Vitals   11/25/17 1416  BP: 120/76  Pulse: 66  Temp: 97.9 F (36.6 C)  TempSrc: Oral  Weight: 112 lb (50.8 kg)  Height: 5' 0.5" (1.537 m)  PainSc: 0-No pain   Body mass index is 21.51 kg/m.   Objective:  Physical Exam  Constitutional: She is oriented to person, place, and time. She appears well-developed  and well-nourished.  HENT:  Head: Normocephalic and atraumatic.  Neck: Normal range of motion.  Cardiovascular: Normal rate, regular rhythm and normal heart sounds.  Pulmonary/Chest: Effort normal and breath sounds normal.  Neurological: She is alert and oriented to person, place, and time.  Psychiatric: She has a normal mood and affect.  Nursing note and vitals reviewed.       Assessment And Plan:     1. Female climacteric state  CHRONIC, YET IMPROVED. AS STATED PREVIOUSLY, SHE IS READY TO START WEANING OFF OF HER MEDS. SHE WILL START WITH DECREASING DOSE OF BIEST TO 1/2 CC MWF DOSING. I WILL ALSO DECREASE HER PROGESTERONE SR CAPS TO 75MG  QHS. REFILL CALLED INTO [facility]. OUR PLAN IS FOR HER TO BE WEANED OFF WITHIN THE NEXT 90 DAYS.  SHE WILL RTO IN JAN 2020 FOR F/U.    2. Osteopenia of left hip  RECENT DEXA SCAN RESULTS REVIEWED IN FULL DETAIL. SHE PREVIOUSLY HAD OSTEOPOROSIS. SHE IS ENCOURAGED TO CONTINUE WITH CALCIUM/VIT D SUPPLEMENTATION  AND REGULAR WEIGHTBEARING EXERCISES.  PT IS AWARE THAT THE TOPICAL ESTROGEN SUPPLEMENTATION LIKELY HELPED THIS CONDITION AS WELL.   3. Memory difficulties  SHE REPORTS OCC. DIFFICULTY WITH WORD-FINDING. SHE DOES NOT WISH TO HAVE ANY BLOODWORK DRAWN AT THIS TIME. SHE IS ENCOURAGED TO HAVE HER PCP CHECK TSH/VIT B12 LEVELS.   Maximino Greenland, MD

## 2017-12-01 ENCOUNTER — Encounter: Payer: Self-pay | Admitting: Internal Medicine

## 2017-12-17 ENCOUNTER — Other Ambulatory Visit: Payer: Self-pay

## 2017-12-28 ENCOUNTER — Encounter: Payer: Self-pay | Admitting: Internal Medicine

## 2017-12-30 ENCOUNTER — Other Ambulatory Visit (HOSPITAL_COMMUNITY): Payer: Self-pay | Admitting: *Deleted

## 2017-12-31 ENCOUNTER — Encounter (HOSPITAL_COMMUNITY)
Admission: RE | Admit: 2017-12-31 | Discharge: 2017-12-31 | Disposition: A | Discharge status: Home / Self Care | Payer: Medicare Other | Source: Ambulatory Visit | Attending: Gastroenterology | Admitting: Gastroenterology

## 2017-12-31 DIAGNOSIS — K5090 Crohn's disease, unspecified, without complications: Secondary | ICD-10-CM | POA: Insufficient documentation

## 2017-12-31 MED ORDER — SODIUM CHLORIDE 0.9 % IV SOLN
INTRAVENOUS | Status: DC
  Administered 2017-12-31: 09:00:00 via INTRAVENOUS

## 2017-12-31 MED ORDER — SODIUM CHLORIDE 0.9 % IV SOLN
200.0000 mg | INTRAVENOUS | Status: DC
  Administered 2017-12-31: 200 mg via INTRAVENOUS
  Filled 2017-12-31: qty 20

## 2017-12-31 MED ORDER — DIPHENHYDRAMINE HCL 25 MG PO CAPS: 50.0000 mg | ORAL_CAPSULE | Freq: Once | ORAL | Status: DC

## 2017-12-31 MED ORDER — ACETAMINOPHEN 500 MG PO TABS: 500.0000 mg | ORAL_TABLET | Freq: Once | ORAL | Status: DC

## 2017-12-31 MED ORDER — PREDNISONE 20 MG PO TABS: 20.0000 mg | ORAL_TABLET | Freq: Once | ORAL | Status: DC

## 2018-01-02 ENCOUNTER — Encounter: Payer: Self-pay | Admitting: Internal Medicine

## 2018-01-05 ENCOUNTER — Encounter: Payer: Self-pay | Admitting: Internal Medicine

## 2018-01-06 ENCOUNTER — Encounter: Payer: Self-pay | Admitting: Internal Medicine

## 2018-01-22 ENCOUNTER — Encounter: Payer: Self-pay | Admitting: Internal Medicine

## 2018-02-10 DIAGNOSIS — K5090 Crohn's disease, unspecified, without complications: Secondary | ICD-10-CM | POA: Diagnosis not present

## 2018-02-16 DIAGNOSIS — M25551 Pain in right hip: Secondary | ICD-10-CM | POA: Diagnosis not present

## 2018-02-24 ENCOUNTER — Ambulatory Visit (INDEPENDENT_AMBULATORY_CARE_PROVIDER_SITE_OTHER): Payer: Medicare Other | Admitting: Internal Medicine

## 2018-02-24 ENCOUNTER — Encounter: Payer: Self-pay | Admitting: Internal Medicine

## 2018-02-24 ENCOUNTER — Ambulatory Visit: Payer: Medicare Other | Admitting: Internal Medicine

## 2018-02-24 VITALS — BP 110/58 | HR 73 | Temp 98.2°F | Ht 61.0 in | Wt 113.4 lb

## 2018-02-24 DIAGNOSIS — M8580 Other specified disorders of bone density and structure, unspecified site: Secondary | ICD-10-CM | POA: Diagnosis not present

## 2018-02-24 DIAGNOSIS — L719 Rosacea, unspecified: Secondary | ICD-10-CM | POA: Diagnosis not present

## 2018-02-24 DIAGNOSIS — N951 Menopausal and female climacteric states: Secondary | ICD-10-CM

## 2018-02-24 DIAGNOSIS — N393 Stress incontinence (female) (male): Secondary | ICD-10-CM

## 2018-02-24 NOTE — Progress Notes (Signed)
Subjective:     Patient ID: Cassandra Herring , female    DOB: May 02, 1945 , 73 y.o.   MRN: [id number]   Chief Complaint  Patient presents with  . Hormones f/u    HPI  She is here today for discussion of possibility of resuming BHRT. She has recently weaned herself off of biest and progesterone. She is now reading Cassandra Herring's book, The Wisdom of Menopause. She has concerns regarding how BHRT can help with bone health, hot flashes and bladder function.     Past Medical History:  Diagnosis Date  . Anemia   . Basal cell carcinoma   . Bursitis    left shoulder   . Crohn disease ([place])   . HNP (herniated nucleus pulposus)   . Mass    gluteal  . Osteopenia   . Partial tear of left subscapularis tendon   . STD (sexually transmitted disease)    Hx of genital warts  . Tendon tear    shoulder  . Thyroid nodule   . Vertigo   . Vitamin D deficiency      Family History  Problem Relation Age of Onset  . Lymphoma Father        non-hodkins  . Heart attack Father   . Cancer Father        skin  . Kidney disease Mother   . Heart attack Mother   . Emphysema Mother   . Breast cancer Maternal Aunt      Current Outpatient Medications:  .  Ascorbic Acid (VITAMIN C) 1000 MG tablet, Take 1,000 mg by mouth daily., Disp: , Rfl:  .  BORON PO, Take by mouth., Disp: , Rfl:  .  CALCIUM PO, Take by mouth daily., Disp: , Rfl:  .  Cholecalciferol (VITAMIN D) 2000 UNITS tablet, Take 2,000 Units by mouth 2 (two) times daily. , Disp: , Rfl:  .  COPPER PO, Take by mouth., Disp: , Rfl:  .  DHEA 10 MG CAPS, Take by mouth., Disp: , Rfl:  .  EVENING PRIMROSE OIL PO, Take by mouth as needed., Disp: , Rfl:  .  inFLIXimab (REMICADE) 100 MG injection, Inject 200 mg into the vein. Every 8-10 weeks, Disp: , Rfl:  .  MANGANESE PO, Take by mouth., Disp: , Rfl:  .  Omega-3 Fatty Acids (FISH OIL PO), Take by mouth as needed., Disp: , Rfl:  .  VITAMIN K PO, Take by mouth., Disp: , Rfl:  .   Zinc Sulfate (ZINC 15 PO), Take by mouth., Disp: , Rfl:  .  NONFORMULARY OR COMPOUNDED ITEM, daily. Biest/ estradiol 0.1-0.025, Disp: , Rfl:  .  UNABLE TO FIND, Progesterone 100mg  compound once daily, Disp: , Rfl:    Allergies  Allergen Reactions  . Humira [Adalimumab] Rash    Raised persistent rash around the injection site.  . Methylprednisolone Rash  . Remicade [Infliximab] Hives, Rash and Other (See Comments)    Tightening of chest. Per patient pre treatment requires steroids, benadryl and a low dose of remicade.     Review of Systems  Constitutional: Negative.   Respiratory: Negative.   Cardiovascular: Negative.   Gastrointestinal: Negative.   Skin: Positive for color change (she feels her rosacea has worsened since stopping topical estrogen).  Neurological: Negative.   Psychiatric/Behavioral: Negative.      Today's Vitals   02/24/18 1406  BP: (!) 110/58  Pulse: 73  Temp: 98.2 F (36.8 C)  TempSrc: Oral  Weight: 113 lb  6.4 oz (51.4 kg)  Height: 5\' 1"  (1.549 m)   Body mass index is 21.43 kg/m.   Objective:  Physical Exam Vitals signs and nursing note reviewed.  Constitutional:      Appearance: Normal appearance.  HENT:     Head: Normocephalic and atraumatic.  Cardiovascular:     Rate and Rhythm: Normal rate and regular rhythm.     Heart sounds: Normal heart sounds.  Skin:    General: Skin is warm.     Comments: Telangiectasias b/l cheeks  Neurological:     General: No focal deficit present.     Mental Status: She is alert.         Assessment And Plan:     1. Female climacteric state  She is now off of BHRT. She has noticed a return of her hot flashes. She is not sure if she will resume therapy. She was advised to get updated version of Cassandra Herring book because many treatments have likely changed in the past 20 years. She is encouraged to base her decision for therapy on updated information.   2. Stress incontinence of urine  She is encouraged to  discuss this further with her GYN or PCP. Pt is aware that vaginal estriol supplementation can greatly help with these symptoms. She is also advised that she needs to perform Kegel exercises regularly as well.   3. Osteopenia, unspecified location  We reviewed her most recent dexa scan results. Again, she is considering resumption of BHRT b/c positive effects it has had on her bone health. She will contact me should she decide to resume BHRT.  4. Rosacea  She has f/u scheduled within the next week. As per dermatology.   Cassandra Greenland, MD

## 2018-02-25 ENCOUNTER — Ambulatory Visit: Payer: Medicare Other | Admitting: Internal Medicine

## 2018-02-25 ENCOUNTER — Encounter: Payer: Self-pay | Admitting: Internal Medicine

## 2018-02-25 DIAGNOSIS — D225 Melanocytic nevi of trunk: Secondary | ICD-10-CM | POA: Diagnosis not present

## 2018-02-25 DIAGNOSIS — Z85828 Personal history of other malignant neoplasm of skin: Secondary | ICD-10-CM | POA: Diagnosis not present

## 2018-02-25 DIAGNOSIS — L719 Rosacea, unspecified: Secondary | ICD-10-CM | POA: Diagnosis not present

## 2018-02-25 DIAGNOSIS — L821 Other seborrheic keratosis: Secondary | ICD-10-CM | POA: Diagnosis not present

## 2018-02-25 DIAGNOSIS — Z23 Encounter for immunization: Secondary | ICD-10-CM | POA: Diagnosis not present

## 2018-02-25 DIAGNOSIS — L658 Other specified nonscarring hair loss: Secondary | ICD-10-CM | POA: Diagnosis not present

## 2018-02-26 ENCOUNTER — Ambulatory Visit: Payer: Medicare Other | Admitting: Internal Medicine

## 2018-02-27 ENCOUNTER — Other Ambulatory Visit (HOSPITAL_COMMUNITY): Payer: Self-pay | Admitting: *Deleted

## 2018-03-02 ENCOUNTER — Encounter (HOSPITAL_COMMUNITY)
Admission: RE | Admit: 2018-03-02 | Discharge: 2018-03-02 | Disposition: A | Discharge status: Home / Self Care | Payer: Medicare Other | Source: Ambulatory Visit | Attending: Gastroenterology | Admitting: Gastroenterology

## 2018-03-02 DIAGNOSIS — K5090 Crohn's disease, unspecified, without complications: Secondary | ICD-10-CM | POA: Diagnosis not present

## 2018-03-02 MED ORDER — SODIUM CHLORIDE 0.9 % IV SOLN
INTRAVENOUS | Status: DC
  Administered 2018-03-02: 10:00:00 via INTRAVENOUS

## 2018-03-02 MED ORDER — SODIUM CHLORIDE 0.9 % IV SOLN
200.0000 mg | INTRAVENOUS | Status: DC
  Administered 2018-03-02: 200 mg via INTRAVENOUS
  Filled 2018-03-02: qty 20

## 2018-03-02 NOTE — Progress Notes (Signed)
Patient stated she took tylenol, prednisone, and benadryl this am.

## 2018-03-04 ENCOUNTER — Encounter: Payer: Self-pay | Admitting: Internal Medicine

## 2018-03-06 DIAGNOSIS — E7800 Pure hypercholesterolemia, unspecified: Secondary | ICD-10-CM | POA: Diagnosis not present

## 2018-03-06 DIAGNOSIS — E559 Vitamin D deficiency, unspecified: Secondary | ICD-10-CM | POA: Diagnosis not present

## 2018-03-06 DIAGNOSIS — L659 Nonscarring hair loss, unspecified: Secondary | ICD-10-CM | POA: Diagnosis not present

## 2018-03-06 DIAGNOSIS — R748 Abnormal levels of other serum enzymes: Secondary | ICD-10-CM | POA: Diagnosis not present

## 2018-03-06 DIAGNOSIS — R899 Unspecified abnormal finding in specimens from other organs, systems and tissues: Secondary | ICD-10-CM | POA: Diagnosis not present

## 2018-03-06 DIAGNOSIS — Z0000 Encounter for general adult medical examination without abnormal findings: Secondary | ICD-10-CM | POA: Diagnosis not present

## 2018-03-06 DIAGNOSIS — Z1389 Encounter for screening for other disorder: Secondary | ICD-10-CM | POA: Diagnosis not present

## 2018-03-06 DIAGNOSIS — M89319 Hypertrophy of bone, unspecified shoulder: Secondary | ICD-10-CM | POA: Diagnosis not present

## 2018-03-17 DIAGNOSIS — M25551 Pain in right hip: Secondary | ICD-10-CM | POA: Diagnosis not present

## 2018-03-22 ENCOUNTER — Encounter: Payer: Self-pay | Admitting: Internal Medicine

## 2018-04-14 ENCOUNTER — Encounter: Payer: Self-pay | Admitting: Internal Medicine

## 2018-04-16 ENCOUNTER — Encounter: Payer: Self-pay | Admitting: Family Medicine

## 2018-04-20 ENCOUNTER — Encounter: Payer: Self-pay | Admitting: Internal Medicine

## 2018-04-21 ENCOUNTER — Other Ambulatory Visit: Payer: Self-pay | Admitting: Internal Medicine

## 2018-04-27 ENCOUNTER — Other Ambulatory Visit: Payer: Self-pay

## 2018-04-28 ENCOUNTER — Ambulatory Visit (HOSPITAL_COMMUNITY)
Admission: RE | Admit: 2018-04-28 | Discharge: 2018-04-28 | Disposition: A | Discharge status: Home / Self Care | Payer: Medicare Other | Source: Ambulatory Visit | Attending: Gastroenterology | Admitting: Gastroenterology

## 2018-04-28 ENCOUNTER — Other Ambulatory Visit: Payer: Self-pay

## 2018-04-28 DIAGNOSIS — K5090 Crohn's disease, unspecified, without complications: Secondary | ICD-10-CM | POA: Insufficient documentation

## 2018-04-28 MED ORDER — DIPHENHYDRAMINE HCL 25 MG PO CAPS: 50.0000 mg | ORAL_CAPSULE | ORAL | Status: DC

## 2018-04-28 MED ORDER — SODIUM CHLORIDE 0.9 % IV SOLN
INTRAVENOUS | Status: DC
  Administered 2018-04-28: 10:00:00 via INTRAVENOUS

## 2018-04-28 MED ORDER — ACETAMINOPHEN 500 MG PO TABS: 500.0000 mg | ORAL_TABLET | ORAL | Status: DC

## 2018-04-28 MED ORDER — PREDNISONE 20 MG PO TABS: 20.0000 mg | ORAL_TABLET | ORAL | Status: DC

## 2018-04-28 MED ORDER — SODIUM CHLORIDE 0.9 % IV SOLN
200.0000 mg | INTRAVENOUS | Status: DC
  Administered 2018-04-28: 200 mg via INTRAVENOUS
  Filled 2018-04-28 (×3): qty 20

## 2018-05-14 ENCOUNTER — Encounter: Payer: Self-pay | Admitting: Internal Medicine

## 2018-05-18 ENCOUNTER — Telehealth: Payer: Self-pay

## 2018-05-18 NOTE — Telephone Encounter (Signed)
Let her know we will discuss during her visit. thx

## 2018-05-18 NOTE — Telephone Encounter (Signed)
Patient called stating she is still having hot flashes and night sweats and she wanted to know if you wanted to readjust her dose before she picks up a refill? She stated she also sent a message through the portal with more detail. Please advise YRL,RMA

## 2018-05-19 NOTE — Telephone Encounter (Signed)
Patient notified [facility]

## 2018-05-21 ENCOUNTER — Ambulatory Visit (INDEPENDENT_AMBULATORY_CARE_PROVIDER_SITE_OTHER): Payer: Medicare Other | Admitting: Internal Medicine

## 2018-05-21 ENCOUNTER — Encounter: Payer: Self-pay | Admitting: Internal Medicine

## 2018-05-21 ENCOUNTER — Other Ambulatory Visit: Payer: Self-pay

## 2018-05-21 VITALS — Ht 60.0 in

## 2018-05-21 DIAGNOSIS — N951 Menopausal and female climacteric states: Secondary | ICD-10-CM | POA: Diagnosis not present

## 2018-05-21 DIAGNOSIS — N393 Stress incontinence (female) (male): Secondary | ICD-10-CM | POA: Diagnosis not present

## 2018-05-21 DIAGNOSIS — R413 Other amnesia: Secondary | ICD-10-CM

## 2018-05-21 DIAGNOSIS — M85852 Other specified disorders of bone density and structure, left thigh: Secondary | ICD-10-CM | POA: Diagnosis not present

## 2018-05-21 NOTE — Patient Instructions (Signed)

## 2018-05-24 NOTE — Progress Notes (Signed)
Virtual Visit via Video   This visit type was conducted due to national recommendations for restrictions regarding the COVID-19 Pandemic (e.g. social distancing) in an effort to limit this patient's exposure and mitigate transmission in our community.  Due to her co-morbid illnesses, this patient is at least at moderate risk for complications without adequate follow up.  This format is felt to be most appropriate for this patient at this time.  All issues noted in this document were discussed and addressed.  A limited physical exam was performed with this format.    This visit type was conducted due to national recommendations for restrictions regarding the COVID-19 Pandemic (e.g. social distancing) in an effort to limit this patient's exposure and mitigate transmission in our community.  Patients identity confirmed using two different identifiers.  This format is felt to be most appropriate for this patient at this time.  All issues noted in this document were discussed and addressed.  No physical exam was performed (except for noted visual exam findings with Video Visits).    Date:  05/24/2018   ID:  Cassandra Herring, DOB 1945-03-01, MRN [id number]  Patient Location:  Home  Provider location:   Office    Chief Complaint:  Hormone f/u  History of Present Illness:    Cassandra Herring is a 73 y.o. female who presents via video conferencing for a telehealth visit today.    The patient does not have symptoms concerning for COVID-19 infection (fever, chills, cough, or new shortness of breath).   She presents today for a virtual visit, per her request. She prefers this mode of contact due to COVID-19 pandemic. She is able to connect via video.  Purpose of visit is to f/u BHRT therapy. She is currently using estriol vaginal cream three nights weekly and progesterone SR 18m nightly except Sundays. Unfortunately,she is still having hot flashes. She admits to recent use of soy protein  powder. She read in Dr. NZannie Kehrbook, "Wisdom of Menopause" that it would be helpful for menopausal symptoms.      Past Medical History:  Diagnosis Date   Anemia    Basal cell carcinoma    Bursitis    left shoulder    Crohn disease (H[facility])    HNP (herniated nucleus pulposus)    Mass    gluteal   Osteopenia    Partial tear of left subscapularis tendon    STD (sexually transmitted disease)    Hx of genital warts   Tendon tear    shoulder   Thyroid nodule    Vertigo    Vitamin D deficiency    Past Surgical History:  Procedure Laterality Date   BREAST SURGERY     implants-1981, removal-2003     Current Meds  Medication Sig   Ascorbic Acid (VITAMIN C) 1000 MG tablet Take 1,000 mg by mouth daily.   BORON PO Take by mouth.   CALCIUM PO Take by mouth daily.   Cholecalciferol (VITAMIN D) 2000 UNITS tablet Take 2,000 Units by mouth 2 (two) times daily.    COPPER PO Take by mouth.   Estriol 10 % CREA by Does not apply route. 0.1 mg vaginally 1 time per week   EVENING PRIMROSE OIL PO Take by mouth as needed.   inFLIXimab (REMICADE) 100 MG injection Inject 200 mg into the vein. Every 8-10 weeks   MANGANESE PO Take by mouth.   NON FORMULARY Apply topically at bedtime. azaleic acid 15%, metronidazole 1%, ivermectin  1%   Omega-3 Fatty Acids (FISH OIL PO) Take 2 capsules by mouth daily.   Progesterone Micronized (PROGESTERONE PO) Take 50 mg by mouth. 1 cap every night except Sunday   VITAMIN K PO Take by mouth.   Zinc Sulfate (ZINC 15 PO) Take by mouth.     Allergies:   Humira [adalimumab]; Methylprednisolone; and Remicade [infliximab]   Social History   Tobacco Use   Smoking status: Never Smoker   Smokeless tobacco: Never Used  Substance Use Topics   Alcohol use: No    Alcohol/week: 0.0 standard drinks   Drug use: No     Family Hx: The patient's family history includes Breast cancer in her maternal aunt; Cancer in her father; Emphysema in  her mother; Heart attack in her father and mother; Kidney disease in her mother; Lymphoma in her father.  ROS:   Please see the history of present illness.    Review of Systems  Constitutional: Negative.   Respiratory: Negative.   Cardiovascular: Negative.   Gastrointestinal: Negative.   Neurological: Negative.   Psychiatric/Behavioral: Negative.     All other systems reviewed and are negative.   Labs/Other Tests and Data Reviewed:    Recent Labs: 07/22/2017: ALT 16   Recent Lipid Panel No results found for: CHOL, TRIG, HDL, CHOLHDL, LDLCALC, LDLDIRECT  Wt Readings from Last 3 Encounters:  04/28/18 110 lb (49.9 kg)  03/02/18 110 lb (49.9 kg)  02/24/18 113 lb 6.4 oz (51.4 kg)     Exam:    Vital Signs:  Ht 5' (1.524 m)    LMP 01/29/1999    BMI 21.48 kg/m     Physical Exam  Constitutional: She is oriented to person, place, and time and well-developed, well-nourished, and in no distress.  HENT:  Head: Normocephalic and atraumatic.  Neck: Normal range of motion.  Pulmonary/Chest: Effort normal.  Neurological: She is alert and oriented to person, place, and time.  Psychiatric: Affect normal.  Nursing note and vitals reviewed.   ASSESSMENT & PLAN:     1. Female climacteric state  She was advised to continue with 1/2 syringe of the estriol vaginal cream on Tuesdays/Th/Sat. She is encouraged to contact me next week to let me know if her hot flashes have continued. If so, she will resume full syringe, and perhaps add an extra day of treatment for a total of four days of vaginal cream use.  I will be sure to call in refills of both estriol vaginal cream and progesterone SR 48m capsules into [facility] She is also encouraged to consider cutting back on use of soy protein powder. She is reminded that soy is one of the products that is highly genetically modified. She is encouraged to limit her use of soy to organic products only.   2. Stress incontinence of  urine  Improved with use of vaginal estriol cream. Pt advised this should continue to improve over time.   3. Osteopenia of left hip  Chronic. Importance of calcium/vitamin D supplementation along with engaging in weight-bearing exercises was discussed with the patient.   4. Memory difficulties  She has noticed an improvement in her cognitive function with use of BHRT.     COVID-19 Education: The signs and symptoms of COVID-19 were discussed with the patient and how to seek care for testing (follow up with PCP or arrange E-visit).  The importance of social distancing was discussed today.  Patient Risk:   After full review of this patients clinical  status, I feel that they are at least moderate risk at this time.  Time:   Today, I have spent 19 minutes/ 19 seconds with the patient with telehealth technology discussing above diagnoses.     Medication Adjustments/Labs and Tests Ordered: Current medicines are reviewed at length with the patient today.  Concerns regarding medicines are outlined above.   Tests Ordered: No orders of the defined types were placed in this encounter.   Medication Changes: No orders of the defined types were placed in this encounter.   Disposition:  Follow up in 6 week(s)  Signed, Maximino Greenland, MD

## 2018-05-25 ENCOUNTER — Encounter: Payer: Self-pay | Admitting: Internal Medicine

## 2018-06-07 ENCOUNTER — Encounter: Payer: Self-pay | Admitting: Internal Medicine

## 2018-06-23 ENCOUNTER — Other Ambulatory Visit: Payer: Self-pay

## 2018-06-24 ENCOUNTER — Encounter (HOSPITAL_COMMUNITY)
Admission: RE | Admit: 2018-06-24 | Discharge: 2018-06-24 | Disposition: A | Discharge status: Home / Self Care | Payer: Medicare Other | Source: Ambulatory Visit | Attending: Gastroenterology | Admitting: Gastroenterology

## 2018-06-24 DIAGNOSIS — K5090 Crohn's disease, unspecified, without complications: Secondary | ICD-10-CM | POA: Diagnosis not present

## 2018-06-24 MED ORDER — PREDNISONE 20 MG PO TABS: 20.0000 mg | ORAL_TABLET | ORAL | Status: DC

## 2018-06-24 MED ORDER — ACETAMINOPHEN 500 MG PO TABS: 500.0000 mg | ORAL_TABLET | ORAL | Status: DC

## 2018-06-24 MED ORDER — SODIUM CHLORIDE 0.9 % IV SOLN
200.0000 mg | INTRAVENOUS | Status: DC
  Administered 2018-06-24: 10:00:00 200 mg via INTRAVENOUS
  Filled 2018-06-24: qty 20

## 2018-06-24 MED ORDER — DIPHENHYDRAMINE HCL 25 MG PO CAPS: 50.0000 mg | ORAL_CAPSULE | ORAL | Status: DC

## 2018-06-24 MED ORDER — SODIUM CHLORIDE 0.9 % IV SOLN
INTRAVENOUS | Status: DC
  Administered 2018-06-24: 10:00:00 via INTRAVENOUS

## 2018-06-26 ENCOUNTER — Telehealth: Payer: Self-pay | Admitting: Internal Medicine

## 2018-06-26 NOTE — Telephone Encounter (Signed)
Patient has agreed to have a virtual visit with dr.sanders on Monday 06/29/18

## 2018-06-30 ENCOUNTER — Encounter: Payer: Self-pay | Admitting: Internal Medicine

## 2018-06-30 ENCOUNTER — Ambulatory Visit (INDEPENDENT_AMBULATORY_CARE_PROVIDER_SITE_OTHER): Payer: Medicare Other | Admitting: Internal Medicine

## 2018-06-30 ENCOUNTER — Other Ambulatory Visit: Payer: Self-pay

## 2018-06-30 VITALS — BP 104/58 | Temp 92.0°F | Ht 60.75 in

## 2018-06-30 DIAGNOSIS — N393 Stress incontinence (female) (male): Secondary | ICD-10-CM | POA: Diagnosis not present

## 2018-06-30 DIAGNOSIS — N951 Menopausal and female climacteric states: Secondary | ICD-10-CM

## 2018-06-30 NOTE — Patient Instructions (Signed)

## 2018-06-30 NOTE — Progress Notes (Signed)
Virtual Visit via Video   This visit type was conducted due to national recommendations for restrictions regarding the COVID-19 Pandemic (e.g. social distancing) in an effort to limit this patient's exposure and mitigate transmission in our community.  Due to her co-morbid illnesses, this patient is at least at moderate risk for complications without adequate follow up.  This format is felt to be most appropriate for this patient at this time.  All issues noted in this document were discussed and addressed.  A limited physical exam was performed with this format.    This visit type was conducted due to national recommendations for restrictions regarding the COVID-19 Pandemic (e.g. social distancing) in an effort to limit this patient's exposure and mitigate transmission in our community.  Patients identity confirmed using two different identifiers.  This format is felt to be most appropriate for this patient at this time.  All issues noted in this document were discussed and addressed.  No physical exam was performed (except for noted visual exam findings with Video Visits).    Date:  06/30/2018   ID:  Cassandra Herring, DOB 04-10-1945, MRN [id number]  Patient Location:  Home  Provider location:   Office    Chief Complaint:  BHRT f/u  History of Present Illness:    Loyal Rudy is a 73 y.o. female who presents via video conferencing for a telehealth visit today.    The patient does not have symptoms concerning for COVID-19 infection (fever, chills, cough, or new shortness of breath).   She presents today for virtual visit. She prefers this method of contact due to COVID-19 pandemic.  She presents today for f/u BHRT. Unfortunately, she is still having hot flashes - usually occurs 8x/day.  This is an improvement in her symptoms. On 4/27, she started MWFSat dosing of the estriol cream. She reports she was having 15 hot flashes per day previously. She believes we are going in the  right direction.     Past Medical History:  Diagnosis Date  . Anemia   . Basal cell carcinoma   . Bursitis    left shoulder   . Crohn disease ([place])   . HNP (herniated nucleus pulposus)   . Mass    gluteal  . Osteopenia   . Partial tear of left subscapularis tendon   . STD (sexually transmitted disease)    Hx of genital warts  . Tendon tear    shoulder  . Thyroid nodule   . Vertigo   . Vitamin D deficiency    Past Surgical History:  Procedure Laterality Date  . BREAST SURGERY     implants-1981, removal-2003     Current Meds  Medication Sig  . Ascorbic Acid (VITAMIN C) 1000 MG tablet Take 1,000 mg by mouth daily.  Marland Kitchen BORON PO Take by mouth.  Marland Kitchen CALCIUM PO Take by mouth daily.  . Cholecalciferol (VITAMIN D) 2000 UNITS tablet Take 2,000 Units by mouth 2 (two) times daily.   . COPPER PO Take by mouth.  . Estriol 10 % CREA by Does not apply route. 0.1 mg vaginally 3 times  per week  . EVENING PRIMROSE OIL PO Take by mouth as needed.  . inFLIXimab (REMICADE) 100 MG injection Inject 200 mg into the vein. Every 8-10 weeks  . MAGNESIUM PO Take by mouth.  Marland Kitchen MANGANESE PO Take by mouth.  . Omega-3 Fatty Acids (FISH OIL PO) Take 2 capsules by mouth daily.  . Progesterone Micronized (PROGESTERONE PO)  Take 50 mg by mouth. 1 cap every night except Sunday  . VITAMIN K PO Take by mouth.  . Zinc Sulfate (ZINC 15 PO) Take by mouth.     Allergies:   Humira [adalimumab]; Methylprednisolone; and Remicade [infliximab]   Social History   Tobacco Use  . Smoking status: Never Smoker  . Smokeless tobacco: Never Used  Substance Use Topics  . Alcohol use: No    Alcohol/week: 0.0 standard drinks  . Drug use: No     Family Hx: The patient's family history includes Breast cancer in her maternal aunt; Cancer in her father; Emphysema in her mother; Heart attack in her father and mother; Kidney disease in her mother; Lymphoma in her father.  ROS:   Please see the history of present illness.     Review of Systems  Constitutional: Negative.   Respiratory: Negative.   Cardiovascular: Negative.   Gastrointestinal: Negative.   Neurological: Negative.   Psychiatric/Behavioral: Negative.     All other systems reviewed and are negative.   Labs/Other Tests and Data Reviewed:    Recent Labs: 07/22/2017: ALT 16   Recent Lipid Panel No results found for: CHOL, TRIG, HDL, CHOLHDL, LDLCALC, LDLDIRECT  Wt Readings from Last 3 Encounters:  06/24/18 110 lb (49.9 kg)  04/28/18 110 lb (49.9 kg)  03/02/18 110 lb (49.9 kg)     Exam:    Vital Signs:  BP (!) 104/58 (BP Location: Left Arm, Patient Position: Sitting, Cuff Size: Normal) Comment: pt provided  Temp (!) 92 F (33.3 C) Comment: pt provided  Ht 5' 0.75" (1.543 m)   LMP 01/29/1999   BMI 20.96 kg/m     Physical Exam  Constitutional: She is oriented to person, place, and time and well-developed, well-nourished, and in no distress.  HENT:  Head: Normocephalic and atraumatic.  Neck: Normal range of motion.  Pulmonary/Chest: Effort normal.  Neurological: She is alert and oriented to person, place, and time.  Psychiatric: Affect normal.  Nursing note and vitals reviewed.   ASSESSMENT & PLAN:     1. Female climacteric state  We agreed to increase the number of days she uses the vaginal estriol cream. She will continue to use vaginally MWF and topically on Thursdays and Saturdays. Our goal is to decrease frequency/severity of her hot flashes/night sweats. Since we are increasing the dosing days of her estriol, I would also like to increase the dose of her progesterone SR 75mg  daily. This rx will be called into [facility]. She agrees to f/u with me in two weeks by email to let me know how she is doing. Of course, if she needs me sooner, she is encouraged to reach out then. Greater than 50% of face to face time was spent in counseling and coordination of care. All questions were answered to her satisfaction. More than  25 minutes was spent on the call.   2. Stress incontinence of urine  Improved.     COVID-19 Education: The signs and symptoms of COVID-19 were discussed with the patient and how to seek care for testing (follow up with PCP or arrange E-visit).  The importance of social distancing was discussed today.  Patient Risk:   After full review of this patients clinical status, I feel that they are at least moderate risk at this time.  Time:   Today, I have spent 30 minutes/ 15 seconds with the patient with telehealth technology discussing above diagnoses.     Medication Adjustments/Labs and Tests Ordered:  Current medicines are reviewed at length with the patient today.  Concerns regarding medicines are outlined above.   Tests Ordered: No orders of the defined types were placed in this encounter.   Medication Changes: No orders of the defined types were placed in this encounter.   Disposition:  Follow up in 4 month(s)  Signed, Maximino Greenland, MD

## 2018-07-02 ENCOUNTER — Encounter: Payer: Self-pay | Admitting: Internal Medicine

## 2018-07-14 ENCOUNTER — Encounter: Payer: Self-pay | Admitting: Internal Medicine

## 2018-07-17 ENCOUNTER — Telehealth: Payer: Self-pay

## 2018-07-17 NOTE — Telephone Encounter (Signed)
I called Psychiatrist to get estriol refilled for pt and they stated they have no record of this pt I called pt and left her a v/m to call me so I can see where she is getting this Rx filled. YRL,RMA

## 2018-07-20 ENCOUNTER — Telehealth: Payer: Self-pay

## 2018-07-20 NOTE — Telephone Encounter (Signed)
Patient notified that her estriol compound cream was called in for 2 months with a refill.

## 2018-07-21 ENCOUNTER — Encounter: Payer: Self-pay | Admitting: Internal Medicine

## 2018-08-12 ENCOUNTER — Encounter: Payer: Self-pay | Admitting: Internal Medicine

## 2018-08-20 ENCOUNTER — Other Ambulatory Visit: Payer: Self-pay

## 2018-08-20 ENCOUNTER — Ambulatory Visit (HOSPITAL_COMMUNITY)
Admission: RE | Admit: 2018-08-20 | Discharge: 2018-08-20 | Disposition: A | Discharge status: Home / Self Care | Payer: Medicare Other | Source: Ambulatory Visit | Attending: Gastroenterology | Admitting: Gastroenterology

## 2018-08-20 DIAGNOSIS — K5090 Crohn's disease, unspecified, without complications: Secondary | ICD-10-CM | POA: Diagnosis not present

## 2018-08-20 MED ORDER — ACETAMINOPHEN 500 MG PO TABS: 500.0000 mg | ORAL_TABLET | ORAL | Status: DC

## 2018-08-20 MED ORDER — SODIUM CHLORIDE 0.9 % IV SOLN
200.0000 mg | INTRAVENOUS | Status: DC
  Administered 2018-08-20: 200 mg via INTRAVENOUS
  Filled 2018-08-20: qty 20

## 2018-08-20 MED ORDER — SODIUM CHLORIDE 0.9 % IV SOLN
INTRAVENOUS | Status: DC
  Administered 2018-08-20: 09:00:00 via INTRAVENOUS

## 2018-08-20 MED ORDER — PREDNISONE 20 MG PO TABS: 20.0000 mg | ORAL_TABLET | ORAL | Status: DC

## 2018-08-20 MED ORDER — DIPHENHYDRAMINE HCL 25 MG PO CAPS: 50.0000 mg | ORAL_CAPSULE | ORAL | Status: DC

## 2018-08-25 ENCOUNTER — Ambulatory Visit: Payer: Medicare Other | Admitting: Internal Medicine

## 2018-08-28 ENCOUNTER — Encounter: Payer: Self-pay | Admitting: Internal Medicine

## 2018-09-08 ENCOUNTER — Encounter: Payer: Self-pay | Admitting: Internal Medicine

## 2018-09-29 ENCOUNTER — Other Ambulatory Visit: Payer: Self-pay

## 2018-09-29 ENCOUNTER — Encounter: Payer: Self-pay | Admitting: Internal Medicine

## 2018-09-29 ENCOUNTER — Ambulatory Visit (INDEPENDENT_AMBULATORY_CARE_PROVIDER_SITE_OTHER): Payer: Medicare Other | Admitting: Internal Medicine

## 2018-09-29 VITALS — BP 109/57 | HR 65 | Temp 95.0°F | Ht 61.0 in | Wt 113.0 lb

## 2018-09-29 DIAGNOSIS — K50919 Crohn's disease, unspecified, with unspecified complications: Secondary | ICD-10-CM | POA: Diagnosis not present

## 2018-09-29 DIAGNOSIS — N393 Stress incontinence (female) (male): Secondary | ICD-10-CM | POA: Diagnosis not present

## 2018-09-29 DIAGNOSIS — N951 Menopausal and female climacteric states: Secondary | ICD-10-CM | POA: Diagnosis not present

## 2018-09-29 DIAGNOSIS — Z712 Person consulting for explanation of examination or test findings: Secondary | ICD-10-CM | POA: Diagnosis not present

## 2018-09-29 NOTE — Patient Instructions (Signed)
Menopause Menopause is the normal time of life when menstrual periods stop completely. It is usually confirmed by 12 months without a menstrual period. The transition to menopause (perimenopause) most often happens between the ages of 45 and 55. During perimenopause, hormone levels change in your body, which can cause symptoms and affect your health. Menopause may increase your risk for:  Loss of bone (osteoporosis), which causes bone breaks (fractures).  Depression.  Hardening and narrowing of the arteries (atherosclerosis), which can cause heart attacks and strokes. What are the causes? This condition is usually caused by a natural change in hormone levels that happens as you get older. The condition may also be caused by surgery to remove both ovaries (bilateral oophorectomy). What increases the risk? This condition is more likely to start at an earlier age if you have certain medical conditions or treatments, including:  A tumor of the pituitary gland in the brain.  A disease that affects the ovaries and hormone production.  Radiation treatment for cancer.  Certain cancer treatments, such as chemotherapy or hormone (anti-estrogen) therapy.  Heavy smoking and excessive alcohol use.  Family history of early menopause. This condition is also more likely to develop earlier in women who are very thin. What are the signs or symptoms? Symptoms of this condition include:  Hot flashes.  Irregular menstrual periods.  Night sweats.  Changes in feelings about sex. This could be a decrease in sex drive or an increased comfort around your sexuality.  Vaginal dryness and thinning of the vaginal walls. This may cause painful intercourse.  Dryness of the skin and development of wrinkles.  Headaches.  Problems sleeping (insomnia).  Mood swings or irritability.  Memory problems.  Weight gain.  Hair growth on the face and chest.  Bladder infections or problems with urinating. How  is this diagnosed? This condition is diagnosed based on your medical history, a physical exam, your age, your menstrual history, and your symptoms. Hormone tests may also be done. How is this treated? In some cases, no treatment is needed. You and your health care provider should make a decision together about whether treatment is necessary. Treatment will be based on your individual condition and preferences. Treatment for this condition focuses on managing symptoms. Treatment may include:  Menopausal hormone therapy (MHT).  Medicines to treat specific symptoms or complications.  Acupuncture.  Vitamin or herbal supplements. Before starting treatment, make sure to let your health care provider know if you have a personal or family history of:  Heart disease.  Breast cancer.  Blood clots.  Diabetes.  Osteoporosis. Follow these instructions at home: Lifestyle  Do not use any products that contain nicotine or tobacco, such as cigarettes and e-cigarettes. If you need help quitting, ask your health care provider.  Get at least 30 minutes of physical activity on 5 or more days each week.  Avoid alcoholic and caffeinated beverages, as well as spicy foods. This may help prevent hot flashes.  Get 7-8 hours of sleep each night.  If you have hot flashes, try: ? Dressing in layers. ? Avoiding things that may trigger hot flashes, such as spicy food, warm places, or stress. ? Taking slow, deep breaths when a hot flash starts. ? Keeping a fan in your home and office.  Find ways to manage stress, such as deep breathing, meditation, or journaling.  Consider going to group therapy with other women who are having menopause symptoms. Ask your health care provider about recommended group therapy meetings. Eating and   drinking  Eat a healthy, balanced diet that contains whole grains, lean protein, low-fat dairy, and plenty of fruits and vegetables.  Your health care provider may recommend  adding more soy to your diet. Foods that contain soy include tofu, tempeh, and soy milk.  Eat plenty of foods that contain calcium and vitamin D for bone health. Items that are rich in calcium include low-fat milk, yogurt, beans, almonds, sardines, broccoli, and kale. Medicines  Take over-the-counter and prescription medicines only as told by your health care provider.  Talk with your health care provider before starting any herbal supplements. If prescribed, take vitamins and supplements as told by your health care provider. These may include: ? Calcium. Women age 51 and older should get 1,200 mg (milligrams) of calcium every day. ? Vitamin D. Women need 600-800 International Units of vitamin D each day. ? Vitamins B12 and B6. Aim for 50 micrograms of B12 and 1.5 mg of B6 each day. General instructions  Keep track of your menstrual periods, including: ? When they occur. ? How heavy they are and how long they last. ? How much time passes between periods.  Keep track of your symptoms, noting when they start, how often you have them, and how long they last.  Use vaginal lubricants or moisturizers to help with vaginal dryness and improve comfort during sex.  Keep all follow-up visits as told by your health care provider. This is important. This includes any group therapy or counseling. Contact a health care provider if:  You are still having menstrual periods after age 55.  You have pain during sex.  You have not had a period for 12 months and you develop vaginal bleeding. Get help right away if:  You have: ? Severe depression. ? Excessive vaginal bleeding. ? Pain when you urinate. ? A fast or irregular heart beat (palpitations). ? Severe headaches. ? Abdomen (abdominal) pain or severe indigestion.  You fell and you think you have a broken bone.  You develop leg or chest pain.  You develop vision problems.  You feel a lump in your breast. Summary  Menopause is the normal  time of life when menstrual periods stop completely. It is usually confirmed by 12 months without a menstrual period.  The transition to menopause (perimenopause) most often happens between the ages of 45 and 55.  Symptoms can be managed through medicines, lifestyle changes, and complementary therapies such as acupuncture.  Eat a balanced diet that is rich in nutrients to promote bone health and heart health and to manage symptoms during menopause. This information is not intended to replace advice given to you by your health care provider. Make sure you discuss any questions you have with your health care provider. Document Released: 04/06/2003 Document Revised: 12/27/2016 Document Reviewed: 02/17/2016 Elsevier Patient Education  2020 [employer]  

## 2018-10-02 ENCOUNTER — Encounter: Payer: Self-pay | Admitting: Internal Medicine

## 2018-10-02 DIAGNOSIS — E7800 Pure hypercholesterolemia, unspecified: Secondary | ICD-10-CM | POA: Diagnosis not present

## 2018-10-07 NOTE — Progress Notes (Signed)
Virtual Visit via Video   This visit type was conducted due to national recommendations for restrictions regarding the COVID-19 Pandemic (e.g. social distancing) in an effort to limit this patient's exposure and mitigate transmission in our community.  Due to her co-morbid illnesses, this patient is at least at moderate risk for complications without adequate follow up.  This format is felt to be most appropriate for this patient at this time.  All issues noted in this document were discussed and addressed.  A limited physical exam was performed with this format.    This visit type was conducted due to national recommendations for restrictions regarding the COVID-19 Pandemic (e.g. social distancing) in an effort to limit this patient's exposure and mitigate transmission in our community.  Patients identity confirmed using two different identifiers.  This format is felt to be most appropriate for this patient at this time.  All issues noted in this document were discussed and addressed.  No physical exam was performed (except for noted visual exam findings with Video Visits).    Date:  10/07/2018   ID:  Cassandra Herring, DOB 04-Sep-1945, MRN [id number]  Patient Location:  Home  Provider location:   Office    Chief Complaint:  "I need to go over my saliva test results"  History of Present Illness:    Cassandra Herring is a 73 y.o. female who presents via video conferencing for a telehealth visit today.    The patient does not have symptoms concerning for COVID-19 infection (fever, chills, cough, or new shortness of breath).   She presents today for virtual visit. Sje prefers this method of contact due to COVID-19 pandemic.  She prefers to stay home, she does not wish to come into the office. She limits her outside contact. She has seen her PCP virtually as well.  She is anxious to go over her saliva test results. She is still having hot flashes.     Past Medical History:  Diagnosis  Date  . Anemia   . Basal cell carcinoma   . Bursitis    left shoulder   . Crohn disease ([place])   . HNP (herniated nucleus pulposus)   . Mass    gluteal  . Osteopenia   . Partial tear of left subscapularis tendon   . STD (sexually transmitted disease)    Hx of genital warts  . Tendon tear    shoulder  . Thyroid nodule   . Vertigo   . Vitamin D deficiency    Past Surgical History:  Procedure Laterality Date  . BREAST SURGERY     implants-1981, removal-2003     Current Meds  Medication Sig  . Ascorbic Acid (VITAMIN C) 1000 MG tablet Take 1,000 mg by mouth daily.  Marland Kitchen BORON PO Take by mouth.  Marland Kitchen CALCIUM PO Take by mouth daily.  . Cholecalciferol (VITAMIN D) 2000 UNITS tablet Take 2,000 Units by mouth 2 (two) times daily.   . COPPER PO Take by mouth.  . Estriol 10 % CREA by Does not apply route. 0.1 mg vaginally 3 times  per week  . inFLIXimab (REMICADE) 100 MG injection Inject 200 mg into the vein. Every 8-10 weeks  . MAGNESIUM PO Take by mouth.  Marland Kitchen MANGANESE PO Take by mouth.  . Omega-3 Fatty Acids (FISH OIL PO) Take 2 capsules by mouth daily.  . Progesterone Micronized (PROGESTERONE PO) Take 50 mg by mouth. 1 cap every night except Sunday  . VITAMIN K  PO Take by mouth.  . Zinc Sulfate (ZINC 15 PO) Take by mouth.     Allergies:   Humira [adalimumab], Methylprednisolone, and Remicade [infliximab]   Social History   Tobacco Use  . Smoking status: Never Smoker  . Smokeless tobacco: Never Used  Substance Use Topics  . Alcohol use: No    Alcohol/week: 0.0 standard drinks  . Drug use: No     Family Hx: The patient's family history includes Breast cancer in her maternal aunt; Cancer in her father; Emphysema in her mother; Heart attack in her father and mother; Kidney disease in her mother; Lymphoma in her father.  ROS:   Please see the history of present illness.    Review of Systems  Constitutional: Negative.   Respiratory: Negative.   Cardiovascular: Negative.    Gastrointestinal: Negative.   Neurological: Negative.   Psychiatric/Behavioral: Negative.     All other systems reviewed and are negative.   Labs/Other Tests and Data Reviewed:    Recent Labs: No results found for requested labs within last 8760 hours.   Recent Lipid Panel No results found for: CHOL, TRIG, HDL, CHOLHDL, LDLCALC, LDLDIRECT  Wt Readings from Last 3 Encounters:  09/29/18 113 lb (51.3 kg)  08/20/18 110 lb (49.9 kg)  06/24/18 110 lb (49.9 kg)     Exam:    Vital Signs:  BP (!) 109/57 Comment: pt provided  Pulse 65 Comment: pt provided  Temp (!) 95 F (35 C) Comment: pt provided  Ht 5\' 1"  (1.549 m)   Wt 113 lb (51.3 kg)   LMP 01/29/1999   BMI 21.35 kg/m     Physical Exam  Constitutional: She is oriented to person, place, and time and well-developed, well-nourished, and in no distress.  HENT:  Head: Normocephalic and atraumatic.  Pulmonary/Chest: Effort normal.  Neurological: She is alert and oriented to person, place, and time.  Psychiatric: Affect normal.  Nursing note and vitals reviewed.   ASSESSMENT & PLAN:     1. Female climacteric state  I went over her results in full for greater 25 minutes.  Results significant for nl estrone, nl estradiol, low estriol which resulted in a lowestrogen quotient. I will increase her estriol to 0.2 mg vaginally 4x/week. She will initially start with MWF dosing only, and then add the fourth day if her symptoms of hot flashes, urinary incontinence and vaginal dryness have not resolved.  Additionally, she had low progesterone levels which resulted in low E2/progesterone ratio.  I will increase progesterone to 100mg  nightly except Sundays.  She promises to call me in 2 weeks to let me know how she is feeling. Greater than 50% of face to face time was spent in counseling and coordination of care.  Our visit lasted 30 minutes. All questions were answered to her satisfaction. She will rto for VIRTUAL visit in 8 weeks for  re-evaluation.   2. Stress incontinence of urine  Pt advised that her sx should continue to improve as we adjust her BHRT estriol supplementation.  She will let me know if her sx persist.   3. Crohn's disease with complication, unspecified gastrointestinal tract location ([place])  Chronic, as per GI.     COVID-19 Education: The signs and symptoms of COVID-19 were discussed with the patient and how to seek care for testing (follow up with PCP or arrange E-visit).  The importance of social distancing was discussed today.  Patient Risk:   After full review of this patients clinical status, I feel  that they are at least moderate risk at this time.     Medication Adjustments/Labs and Tests Ordered: Current medicines are reviewed at length with the patient today.  Concerns regarding medicines are outlined above.   Tests Ordered: No orders of the defined types were placed in this encounter.   Medication Changes: No orders of the defined types were placed in this encounter.   Disposition:  Follow up in 8 week(s)  Signed, Maximino Greenland, MD

## 2018-10-09 DIAGNOSIS — E559 Vitamin D deficiency, unspecified: Secondary | ICD-10-CM | POA: Diagnosis not present

## 2018-10-09 DIAGNOSIS — E7800 Pure hypercholesterolemia, unspecified: Secondary | ICD-10-CM | POA: Diagnosis not present

## 2018-10-16 ENCOUNTER — Other Ambulatory Visit: Payer: Self-pay

## 2018-10-16 ENCOUNTER — Ambulatory Visit (HOSPITAL_COMMUNITY)
Admission: RE | Admit: 2018-10-16 | Discharge: 2018-10-16 | Disposition: A | Discharge status: Home / Self Care | Payer: Medicare Other | Source: Ambulatory Visit | Attending: Gastroenterology | Admitting: Gastroenterology

## 2018-10-16 DIAGNOSIS — K5090 Crohn's disease, unspecified, without complications: Secondary | ICD-10-CM | POA: Diagnosis not present

## 2018-10-16 MED ORDER — PREDNISONE 20 MG PO TABS: 20.0000 mg | ORAL_TABLET | ORAL | Status: DC

## 2018-10-16 MED ORDER — SODIUM CHLORIDE 0.9 % IV SOLN
INTRAVENOUS | Status: DC
  Administered 2018-10-16: 09:00:00 via INTRAVENOUS

## 2018-10-16 MED ORDER — SODIUM CHLORIDE 0.9 % IV SOLN
200.0000 mg | INTRAVENOUS | Status: DC
  Administered 2018-10-16: 200 mg via INTRAVENOUS
  Filled 2018-10-16: qty 20

## 2018-10-16 MED ORDER — ACETAMINOPHEN 500 MG PO TABS: 500.0000 mg | ORAL_TABLET | ORAL | Status: DC

## 2018-10-16 MED ORDER — DIPHENHYDRAMINE HCL 25 MG PO CAPS: 50.0000 mg | ORAL_CAPSULE | ORAL | Status: DC

## 2018-10-30 ENCOUNTER — Encounter: Payer: Self-pay | Admitting: Internal Medicine

## 2018-12-05 ENCOUNTER — Encounter: Payer: Self-pay | Admitting: Internal Medicine

## 2018-12-09 ENCOUNTER — Encounter: Payer: Self-pay | Admitting: Internal Medicine

## 2018-12-09 ENCOUNTER — Ambulatory Visit (INDEPENDENT_AMBULATORY_CARE_PROVIDER_SITE_OTHER): Payer: Medicare Other | Admitting: Internal Medicine

## 2018-12-09 ENCOUNTER — Other Ambulatory Visit: Payer: Self-pay

## 2018-12-09 VITALS — BP 124/62 | HR 73 | Temp 98.1°F | Ht 61.0 in | Wt 106.4 lb

## 2018-12-09 DIAGNOSIS — N951 Menopausal and female climacteric states: Secondary | ICD-10-CM

## 2018-12-09 DIAGNOSIS — Z79899 Other long term (current) drug therapy: Secondary | ICD-10-CM

## 2018-12-09 DIAGNOSIS — M85852 Other specified disorders of bone density and structure, left thigh: Secondary | ICD-10-CM | POA: Diagnosis not present

## 2018-12-09 DIAGNOSIS — L309 Dermatitis, unspecified: Secondary | ICD-10-CM

## 2018-12-09 DIAGNOSIS — K50919 Crohn's disease, unspecified, with unspecified complications: Secondary | ICD-10-CM

## 2018-12-09 NOTE — Progress Notes (Signed)
Subjective:     Patient ID: Cassandra Herring , female    DOB: 07/10/1945 , 73 y.o.   MRN: [id number]   Chief Complaint  Patient presents with  . Hormones f/u  . Rash    arm pits    HPI  She is here today for f/u BHRT. She feels well on current regimen. She is still having hot flashes, but they have decreased in both severity and frequency.      Past Medical History:  Diagnosis Date  . Anemia   . Basal cell carcinoma   . Bursitis    left shoulder   . Crohn disease ([place])   . HNP (herniated nucleus pulposus)   . Mass    gluteal  . Osteopenia   . Partial tear of left subscapularis tendon   . STD (sexually transmitted disease)    Hx of genital warts  . Tendon tear    shoulder  . Thyroid nodule   . Vertigo   . Vitamin D deficiency      Family History  Problem Relation Age of Onset  . Lymphoma Father        non-hodkins  . Heart attack Father   . Cancer Father        skin  . Kidney disease Mother   . Heart attack Mother   . Emphysema Mother   . Breast cancer Maternal Aunt      Current Outpatient Medications:  .  acetaminophen (TYLENOL) 500 MG tablet, Take 500 mg by mouth every 6 (six) hours as needed. 1 tab before remicaide treatment, Disp: , Rfl:  .  Ascorbic Acid (VITAMIN C) 1000 MG tablet, Take 1,000 mg by mouth daily., Disp: , Rfl:  .  BORON PO, Take by mouth., Disp: , Rfl:  .  CALCIUM PO, Take by mouth daily., Disp: , Rfl:  .  Cholecalciferol (VITAMIN D) 2000 UNITS tablet, Take 2,000 Units by mouth 2 (two) times daily. , Disp: , Rfl:  .  COPPER PO, Take by mouth., Disp: , Rfl:  .  diphenhydrAMINE (BENADRYL) 50 MG capsule, Take 50 mg by mouth every 6 (six) hours as needed. 1 tab before Remicade treatment, Disp: , Rfl:  .  Estriol 10 % CREA, by Does not apply route. 0.2 mg vaginally 3 times  per week, Disp: , Rfl:  .  inFLIXimab (REMICADE) 100 MG injection, Inject 200 mg into the vein. Every 8-10 weeks, Disp: , Rfl:  .  MAGNESIUM PO, Take by mouth., Disp:  , Rfl:  .  Omega-3 Fatty Acids (FISH OIL PO), Take 2 capsules by mouth daily., Disp: , Rfl:  .  predniSONE (DELTASONE) 20 MG tablet, Take 20 mg by mouth daily with breakfast. 1 tablet before and after treatment, Disp: , Rfl:  .  Progesterone Micronized (PROGESTERONE PO), Take 75 mg by mouth. 1 cap every night except Sunday 6x week, Disp: , Rfl:  .  VITAMIN K PO, Take by mouth., Disp: , Rfl:  .  Zinc Sulfate (ZINC 15 PO), Take by mouth., Disp: , Rfl:    Allergies  Allergen Reactions  . Humira [Adalimumab] Rash    Raised persistent rash around the injection site.  . Methylprednisolone Rash  . Remicade [Infliximab] Hives, Rash and Other (See Comments)    Tightening of chest. Per patient pre treatment requires steroids, benadryl and a low dose of remicade.     Review of Systems  Constitutional: Negative.   Respiratory: Negative.   Cardiovascular: Negative.  Gastrointestinal: Negative.   Skin:       She c/o rash on her arms, - described as red, itchy lesions. She denies eating new foods, using new soaps/detergents.   Neurological: Negative.   Psychiatric/Behavioral: Negative.      Today's Vitals   12/09/18 1609  BP: 124/62  Pulse: 73  Temp: 98.1 F (36.7 C)  TempSrc: Oral  Weight: 106 lb 6.4 oz (48.3 kg)  Height: 5\' 1"  (1.549 m)  PainSc: 0-No pain   Body mass index is 20.1 kg/m.   Objective:  Physical Exam Vitals signs and nursing note reviewed.  Constitutional:      Appearance: Normal appearance.  HENT:     Head: Normocephalic and atraumatic.  Cardiovascular:     Rate and Rhythm: Normal rate and regular rhythm.     Heart sounds: Normal heart sounds.  Pulmonary:     Effort: Pulmonary effort is normal.     Breath sounds: Normal breath sounds.  Skin:    General: Skin is warm.     Comments: Faint areas of macular, erythematous lesions on UE. NO vesicular lesions noted.   Neurological:     General: No focal deficit present.     Mental Status: She is alert.   Psychiatric:        Mood and Affect: Mood normal.        Behavior: Behavior normal.         Assessment And Plan:     1. Female climacteric state  Initially, we will change her estriol regimen to MWF vaginal dosing and topical dosing on Saturdays. If her sx persist, we will then increase her dose to 0.25mg .   - Estradiol - Progesterone  2. Dermatitis  She will continue with use of hydrocortisone cream applied to affected area daily as needed.    3. Crohn's disease with complication, unspecified gastrointestinal tract location ([place])  Chronic, also managed by GI. She admits to having some financial restraints. She agrees to [facility] SW referral. Pt advised that pt will likely receive a call in the next week or two.   - CCM Social Work  4. Osteopenia of left hip  Chronic. She is encouraged to continue with weight-bearing exercises along with calcium and vitamin D supplementation.   - CCM Social Work  5. Drug therapy   Maximino Greenland, MD    THE PATIENT IS ENCOURAGED TO PRACTICE SOCIAL DISTANCING DUE TO THE COVID-19 PANDEMIC.

## 2018-12-09 NOTE — Patient Instructions (Signed)
Osteopenia  Osteopenia is a loss of thickness (density) inside of the bones. Another name for osteopenia is low bone mass. Mild osteopenia is a normal part of aging. It is not a disease, and it does not cause symptoms. However, if you have osteopenia and continue to lose bone mass, you could develop a condition that causes the bones to become thin and break more easily (osteoporosis). You may also lose some height, have back pain, and have a stooped posture. Although osteopenia is not a disease, making changes to your lifestyle and diet can help to prevent osteopenia from developing into osteoporosis. What are the causes? Osteopenia is caused by loss of calcium in the bones.  Bones are constantly changing. Old bone cells are continually being replaced with new bone cells. This process builds new bone. The mineral calcium is needed to build new bone and maintain bone density. Bone density is usually highest around age 35. After that, most people's bodies cannot replace all the bone they have lost with new bone. What increases the risk? You are more likely to develop this condition if:  You are older than age 50.  You are a woman who went through menopause early.  You have a long illness that keeps you in bed.  You do not get enough exercise.  You lack certain nutrients (malnutrition).  You have an overactive thyroid gland (hyperthyroidism).  You smoke.  You drink a lot of alcohol.  You are taking medicines that weaken the bones, such as steroids. What are the signs or symptoms? This condition does not cause any symptoms. You may have a slightly higher risk for bone breaks (fractures), so getting fractures more easily than normal may be an indication of osteopenia. How is this diagnosed? Your health care provider can diagnose this condition with a special type of X-ray exam that measures bone density (dual-energy X-ray absorptiometry, DEXA). This test can measure bone density in your  hips, spine, and wrists. Osteopenia has no symptoms, so this condition is usually diagnosed after a routine bone density screening test is done for osteoporosis. This routine screening is usually done for:  Women who are age 65 or older.  Men who are age 70 or older. If you have risk factors for osteopenia, you may have the screening test at an earlier age. How is this treated? Making dietary and lifestyle changes can lower your risk for osteoporosis. If you have severe osteopenia that is close to becoming osteoporosis, your health care provider may prescribe medicines and dietary supplements such as calcium and vitamin D. These supplements help to rebuild bone density. Follow these instructions at home:   Take over-the-counter and prescription medicines only as told by your health care provider. These include vitamins and supplements.  Eat a diet that is high in calcium and vitamin D. ? Calcium is found in dairy products, beans, salmon, and leafy green vegetables like spinach and broccoli. ? Look for foods that have vitamin D and calcium added to them (fortified foods), such as orange juice, cereal, and bread.  Do 30 or more minutes of a weight-bearing exercise every day, such as walking, jogging, or playing a sport. These types of exercises strengthen the bones.  Take precautions at home to lower your risk of falling, such as: ? Keeping rooms well-lit and free of clutter, such as cords. ? Installing safety rails on stairs. ? Using rubber mats in the bathroom or other areas that are often wet or slippery.  Do not use   any products that contain nicotine or tobacco, such as cigarettes and e-cigarettes. If you need help quitting, ask your health care provider.  Avoid alcohol or limit alcohol intake to no more than 1 drink a day for nonpregnant women and 2 drinks a day for men. One drink equals 12 oz of beer, 5 oz of wine, or 1 oz of hard liquor.  Keep all follow-up visits as told by your  health care provider. This is important. Contact a health care provider if:  You have not had a bone density screening for osteoporosis and you are: ? A woman, age 65 or older. ? A man, age 70 or older.  You are a postmenopausal woman who has not had a bone density screening for osteoporosis.  You are older than age 50 and you want to know if you should have bone density screening for osteoporosis. Summary  Osteopenia is a loss of thickness (density) inside of the bones. Another name for osteopenia is low bone mass.  Osteopenia is not a disease, but it may increase your risk for a condition that causes the bones to become thin and break more easily (osteoporosis).  You may be at risk for osteopenia if you are older than age 50 or if you are a woman who went through early menopause.  Osteopenia does not cause any symptoms, but it can be diagnosed with a bone density screening test.  Dietary and lifestyle changes are the first treatment for osteopenia. These may lower your risk for osteoporosis. This information is not intended to replace advice given to you by your health care provider. Make sure you discuss any questions you have with your health care provider. Document Released: 10/23/2016 Document Revised: 12/27/2016 Document Reviewed: 10/23/2016 Elsevier Patient Education  2020 Elsevier Inc.  

## 2018-12-10 LAB — PROGESTERONE: Progesterone: 0.6 ng/mL

## 2018-12-10 LAB — ESTRADIOL: Estradiol: <5 pg/mL

## 2018-12-14 ENCOUNTER — Ambulatory Visit (HOSPITAL_COMMUNITY)
Admission: RE | Admit: 2018-12-14 | Discharge: 2018-12-14 | Disposition: A | Discharge status: Home / Self Care | Payer: Medicare Other | Source: Ambulatory Visit | Attending: Gastroenterology | Admitting: Gastroenterology

## 2018-12-14 ENCOUNTER — Other Ambulatory Visit: Payer: Self-pay

## 2018-12-14 DIAGNOSIS — K5090 Crohn's disease, unspecified, without complications: Secondary | ICD-10-CM | POA: Diagnosis not present

## 2018-12-14 MED ORDER — SODIUM CHLORIDE 0.9 % IV SOLN
200.0000 mg | INTRAVENOUS | Status: DC
  Administered 2018-12-14: 200 mg via INTRAVENOUS
  Filled 2018-12-14: qty 20

## 2018-12-14 MED ORDER — SODIUM CHLORIDE 0.9 % IV SOLN
INTRAVENOUS | Status: DC
  Administered 2018-12-14: 09:00:00 via INTRAVENOUS

## 2018-12-14 MED ORDER — DIPHENHYDRAMINE HCL 25 MG PO CAPS: 50.0000 mg | ORAL_CAPSULE | ORAL | Status: DC

## 2018-12-14 MED ORDER — ACETAMINOPHEN 500 MG PO TABS: 500.0000 mg | ORAL_TABLET | ORAL | Status: DC

## 2018-12-14 NOTE — Progress Notes (Signed)
Patient no longer needs IV team, Medical Day RN was able to get PIV

## 2018-12-16 ENCOUNTER — Ambulatory Visit: Payer: Self-pay

## 2018-12-16 DIAGNOSIS — M85852 Other specified disorders of bone density and structure, left thigh: Secondary | ICD-10-CM

## 2018-12-16 NOTE — Chronic Care Management (AMB) (Signed)
  Chronic Care Management    12/16/2018 Name: [name] MRN: [id number] DOB: November 09, 1945  Referred by: Glendale Chard MD Reason for referral : care coordination for resource needs  SW received referral to assist patient with resource needs related to transportation. Upon chart review it is noted patient to be seen by two primary care providers. SW placed referral to care guide to assist with transportation referral.  Follow Up Plan: SW to close CM referral and place referral to a C3 care guide for community resource needs.  Daneen Schick, BSW, CDP Social Worker, Certified Dementia Practitioner [facility] [phone]

## 2018-12-17 ENCOUNTER — Telehealth: Payer: Self-pay

## 2018-12-17 NOTE — Telephone Encounter (Signed)
12/18/2018 Spoke with patient about transportation resources in [place]. Ambrose Mantle [phone]

## 2018-12-28 ENCOUNTER — Encounter: Payer: Self-pay | Admitting: Internal Medicine

## 2018-12-29 ENCOUNTER — Telehealth: Payer: Self-pay

## 2018-12-29 NOTE — Telephone Encounter (Signed)
12/29/2018 Spoke with patient about transportation and  durable medical  equipment. Patient will call [employer] transportation. She has no other needs at present. Ambrose Mantle [phone]

## 2019-01-11 ENCOUNTER — Encounter: Payer: Self-pay | Admitting: Internal Medicine

## 2019-01-19 ENCOUNTER — Encounter: Payer: Self-pay | Admitting: Internal Medicine

## 2019-01-26 ENCOUNTER — Encounter: Payer: Self-pay | Admitting: Family Medicine

## 2019-02-08 ENCOUNTER — Other Ambulatory Visit (HOSPITAL_COMMUNITY): Payer: Self-pay | Admitting: *Deleted

## 2019-02-09 ENCOUNTER — Ambulatory Visit (HOSPITAL_COMMUNITY)
Admission: RE | Admit: 2019-02-09 | Discharge: 2019-02-09 | Disposition: A | Discharge status: Home / Self Care | Payer: Medicare Other | Source: Ambulatory Visit | Attending: Gastroenterology | Admitting: Gastroenterology

## 2019-02-09 ENCOUNTER — Other Ambulatory Visit: Payer: Self-pay

## 2019-02-09 DIAGNOSIS — K5090 Crohn's disease, unspecified, without complications: Secondary | ICD-10-CM | POA: Diagnosis not present

## 2019-02-09 MED ORDER — ACETAMINOPHEN 325 MG PO TABS: 800.0000 mg | ORAL_TABLET | Freq: Once | ORAL | Status: DC

## 2019-02-09 MED ORDER — SODIUM CHLORIDE 0.9 % IV SOLN: INTRAVENOUS | Status: DC

## 2019-02-09 MED ORDER — PREDNISONE 20 MG PO TABS: 20.0000 mg | ORAL_TABLET | Freq: Once | ORAL | Status: DC

## 2019-02-09 MED ORDER — SODIUM CHLORIDE 0.9 % IV SOLN
200.0000 mg | INTRAVENOUS | Status: DC
  Administered 2019-02-09: 200 mg via INTRAVENOUS
  Filled 2019-02-09: qty 20

## 2019-02-09 MED ORDER — DIPHENHYDRAMINE HCL 25 MG PO CAPS: 50.0000 mg | ORAL_CAPSULE | Freq: Once | ORAL | Status: DC

## 2019-02-12 ENCOUNTER — Encounter: Payer: Self-pay | Admitting: Internal Medicine

## 2019-02-15 ENCOUNTER — Telehealth: Payer: Self-pay | Admitting: *Deleted

## 2019-02-15 NOTE — Telephone Encounter (Signed)
Patient called c/o painful vaginal bump has been using aquaphor and no relief, scheduled with Dr.Lavoie on 02/22/19 asked what else she could put on this area. I explained the doctor can't diagnosis via phone and best to see if sooner appointment could be schedule, transferred to appointment desk.

## 2019-02-16 ENCOUNTER — Other Ambulatory Visit: Payer: Self-pay

## 2019-02-16 ENCOUNTER — Ambulatory Visit (INDEPENDENT_AMBULATORY_CARE_PROVIDER_SITE_OTHER): Payer: Medicare Other | Admitting: Obstetrics & Gynecology

## 2019-02-16 ENCOUNTER — Encounter: Payer: Self-pay | Admitting: Obstetrics & Gynecology

## 2019-02-16 VITALS — BP 124/78

## 2019-02-16 DIAGNOSIS — N9089 Other specified noninflammatory disorders of vulva and perineum: Secondary | ICD-10-CM | POA: Diagnosis not present

## 2019-02-16 DIAGNOSIS — Z8619 Personal history of other infectious and parasitic diseases: Secondary | ICD-10-CM

## 2019-02-16 MED ORDER — VALACYCLOVIR HCL 1 G PO TABS: 1000.0000 mg | ORAL_TABLET | Freq: Every day | ORAL | 3 refills | Status: AC

## 2019-02-16 NOTE — Progress Notes (Signed)
     Cassandra Herring 1945/09/02 [id number]        74 y.o.  G0  RP: Tender vulvar lbump x 6 days  HPI: H/O genital herpes, but no recurrence x 20 yrs.  No vaginal discharge.  No pelvic pain.  No PMB.   OB History  Gravida Para Term Preterm AB Living  0 0 0 0 0 0  SAB TAB Ectopic Multiple Live Births  0 0 0 0      Past medical history,surgical history, problem list, medications, allergies, family history and social history were all reviewed and documented in the EPIC chart.   Directed ROS with pertinent positives and negatives documented in the history of present illness/assessment and plan.  Exam:  Vitals:   02/16/19 0811  BP: 124/78   General appearance:  Normal  Gynecologic exam:  Ulcerated lesion on left lower vulva.  HSV culture done.     Assessment/Plan:  74 y.o. G0P0000   1. Vulvar lesion Most likely a recurrence of genital Herpes.  Recommendations and precautions discussed.  Will send a Valacyclovir prescription for future recurrences.  Pending HSV culture. - Herpes simplex virus culture  2. H/O herpes genitalis - Herpes simplex virus culture  Other orders - Manganese 10 MG TABS; Take by mouth. - Lysine 1000 MG TABS; Take by mouth. - valACYclovir (VALTREX) 1000 MG tablet; Take 1 tablet (1,000 mg total) by mouth daily for 5 days.  Princess Bruins MD, 8:20 AM 02/16/2019

## 2019-02-18 ENCOUNTER — Encounter: Payer: Self-pay | Admitting: Internal Medicine

## 2019-02-18 LAB — HERPES SIMPLEX VIRUS CULTURE
MICRO NUMBER:: 10056474
SPECIMEN QUALITY:: ADEQUATE

## 2019-02-21 ENCOUNTER — Encounter: Payer: Self-pay | Admitting: Obstetrics & Gynecology

## 2019-02-21 NOTE — Patient Instructions (Signed)
1. Vulvar lesion Most likely a recurrence of genital Herpes.  Recommendations and precautions discussed.  Will send a Valacyclovir prescription for future recurrences.  Pending HSV culture. - Herpes simplex virus culture  2. H/O herpes genitalis - Herpes simplex virus culture  Other orders - Manganese 10 MG TABS; Take by mouth. - Lysine 1000 MG TABS; Take by mouth. - valACYclovir (VALTREX) 1000 MG tablet; Take 1 tablet (1,000 mg total) by mouth daily for 5 days.  Coila, it was a pleasure seeing you today!  I will inform you of your results as soon as they are available.

## 2019-02-22 ENCOUNTER — Ambulatory Visit: Payer: Medicare Other | Admitting: Obstetrics & Gynecology

## 2019-03-04 DIAGNOSIS — L309 Dermatitis, unspecified: Secondary | ICD-10-CM | POA: Diagnosis not present

## 2019-03-04 DIAGNOSIS — D225 Melanocytic nevi of trunk: Secondary | ICD-10-CM | POA: Diagnosis not present

## 2019-03-04 DIAGNOSIS — L821 Other seborrheic keratosis: Secondary | ICD-10-CM | POA: Diagnosis not present

## 2019-03-04 DIAGNOSIS — Z85828 Personal history of other malignant neoplasm of skin: Secondary | ICD-10-CM | POA: Diagnosis not present

## 2019-03-04 DIAGNOSIS — C44712 Basal cell carcinoma of skin of right lower limb, including hip: Secondary | ICD-10-CM | POA: Diagnosis not present

## 2019-03-04 DIAGNOSIS — D485 Neoplasm of uncertain behavior of skin: Secondary | ICD-10-CM | POA: Diagnosis not present

## 2019-03-04 DIAGNOSIS — L658 Other specified nonscarring hair loss: Secondary | ICD-10-CM | POA: Diagnosis not present

## 2019-03-04 DIAGNOSIS — L578 Other skin changes due to chronic exposure to nonionizing radiation: Secondary | ICD-10-CM | POA: Diagnosis not present

## 2019-03-04 DIAGNOSIS — L719 Rosacea, unspecified: Secondary | ICD-10-CM | POA: Diagnosis not present

## 2019-03-08 DIAGNOSIS — R21 Rash and other nonspecific skin eruption: Secondary | ICD-10-CM | POA: Diagnosis not present

## 2019-03-08 DIAGNOSIS — M5090 Cervical disc disorder, unspecified, unspecified cervical region: Secondary | ICD-10-CM | POA: Diagnosis not present

## 2019-03-08 DIAGNOSIS — E559 Vitamin D deficiency, unspecified: Secondary | ICD-10-CM | POA: Diagnosis not present

## 2019-03-08 DIAGNOSIS — Z0000 Encounter for general adult medical examination without abnormal findings: Secondary | ICD-10-CM | POA: Diagnosis not present

## 2019-03-08 DIAGNOSIS — Z85828 Personal history of other malignant neoplasm of skin: Secondary | ICD-10-CM | POA: Diagnosis not present

## 2019-03-08 DIAGNOSIS — Z8639 Personal history of other endocrine, nutritional and metabolic disease: Secondary | ICD-10-CM | POA: Diagnosis not present

## 2019-03-08 DIAGNOSIS — K5090 Crohn's disease, unspecified, without complications: Secondary | ICD-10-CM | POA: Diagnosis not present

## 2019-03-08 DIAGNOSIS — E7800 Pure hypercholesterolemia, unspecified: Secondary | ICD-10-CM | POA: Diagnosis not present

## 2019-03-11 ENCOUNTER — Other Ambulatory Visit: Payer: Self-pay

## 2019-03-11 ENCOUNTER — Encounter: Payer: Self-pay | Admitting: Internal Medicine

## 2019-03-11 ENCOUNTER — Telehealth (INDEPENDENT_AMBULATORY_CARE_PROVIDER_SITE_OTHER): Payer: Medicare Other | Admitting: Internal Medicine

## 2019-03-11 VITALS — BP 134/53 | HR 78 | Temp 98.0°F | Ht 61.0 in | Wt 104.0 lb

## 2019-03-11 DIAGNOSIS — N951 Menopausal and female climacteric states: Secondary | ICD-10-CM | POA: Diagnosis not present

## 2019-03-11 DIAGNOSIS — B029 Zoster without complications: Secondary | ICD-10-CM | POA: Diagnosis not present

## 2019-03-11 NOTE — Progress Notes (Signed)
Virtual Visit via Video   This visit type was conducted due to national recommendations for restrictions regarding the COVID-19 Pandemic (e.g. social distancing) in an effort to limit this patient's exposure and mitigate transmission in our community.  Due to her co-morbid illnesses, this patient is at least at moderate risk for complications without adequate follow up.  This format is felt to be most appropriate for this patient at this time.  All issues noted in this document were discussed and addressed.  A limited physical exam was performed with this format.    This visit type was conducted due to national recommendations for restrictions regarding the COVID-19 Pandemic (e.g. social distancing) in an effort to limit this patient's exposure and mitigate transmission in our community.  Patients identity confirmed using two different identifiers.  This format is felt to be most appropriate for this patient at this time.  All issues noted in this document were discussed and addressed.  No physical exam was performed (except for noted visual exam findings with Video Visits).    Date:  03/11/2019   ID:  Cassandra Herring, DOB May 03, 1945, MRN [id number]  Patient Location:  Home  Provider location:   Office    Chief Complaint:  "I have f/u BHRT"  History of Present Illness:    Cassandra Herring is a 74 y.o. female who presents via video conferencing for a telehealth visit today.    The patient does not have symptoms concerning for COVID-19 infection (fever, chills, cough, or new shortness of breath).   She presents today for virtual visit. She prefers this method of contact due to COVID-19 pandemic.  She reports that she has less hot flashes with current regimen , but they are persistent. She has been using estriol/testosterone vaginally three days per week. She has been compliant with progesterone use. She denies change in her diet/sleep habits.     Past Medical History:    Diagnosis Date  . Anemia   . Basal cell carcinoma   . Bursitis    left shoulder   . Crohn disease ([place])   . HNP (herniated nucleus pulposus)   . Mass    gluteal  . Osteopenia   . Partial tear of left subscapularis tendon   . STD (sexually transmitted disease)    Hx of genital warts  . Tendon tear    shoulder  . Thyroid nodule   . Vertigo   . Vitamin D deficiency    Past Surgical History:  Procedure Laterality Date  . BREAST SURGERY     implants-1981, removal-2003     Current Meds  Medication Sig  . acetaminophen (TYLENOL) 500 MG tablet Take 500 mg by mouth every 6 (six) hours as needed. 1 tab before remicaide treatment  . alclomethasone (ACLOVATE) 0.05 % cream Apply 1 application topically 2 (two) times daily.  . Ascorbic Acid (VITAMIN C) 1000 MG tablet Take 1,000 mg by mouth daily.  Marland Kitchen BORON PO Take by mouth.  Marland Kitchen CALCIUM PO Take by mouth daily.  . Cholecalciferol (VITAMIN D) 2000 UNITS tablet Take 2,000 Units by mouth 2 (two) times daily.   . COPPER PO Take by mouth.  . diphenhydrAMINE (BENADRYL) 50 MG capsule Take 50 mg by mouth every 6 (six) hours as needed. 1 tab before Remicade treatment  . Estriol 10 % CREA by Does not apply route. 0.2 mg vaginally 3 times  per week  . inFLIXimab (REMICADE) 100 MG injection Inject 200 mg into the  vein. Every 8-10 weeks  . Lysine 1000 MG TABS Take by mouth.  Marland Kitchen MAGNESIUM PO Take by mouth.  . Manganese 10 MG TABS Take by mouth.  . Omega-3 Fatty Acids (FISH OIL PO) Take 2 capsules by mouth daily.  . predniSONE (DELTASONE) 20 MG tablet Take 20 mg by mouth daily with breakfast. 1 tablet before and after treatment  . Progesterone Micronized (PROGESTERONE PO) Take 75 mg by mouth. 1 cap every night except Sunday 6x week  . VITAMIN K PO Take by mouth.  . Zinc Sulfate (ZINC 15 PO) Take by mouth.     Allergies:   Humira [adalimumab], Methylprednisolone, and Remicade [infliximab]   Social History   Tobacco Use  . Smoking status: Never  Smoker  . Smokeless tobacco: Never Used  Substance Use Topics  . Alcohol use: No    Alcohol/week: 0.0 standard drinks  . Drug use: No     Family Hx: The patient's family history includes Breast cancer in her maternal aunt; Cancer in her father; Emphysema in her mother; Heart attack in her father and mother; Kidney disease in her mother; Lymphoma in her father.  ROS:   Please see the history of present illness.    Review of Systems  Constitutional: Negative.   Respiratory: Negative.   Cardiovascular: Negative.   Gastrointestinal: Negative.   Skin:       She reports having recent bout of shingles. Dx confirmed today by Derm. She is not what has triggered her sx. This is her first occurrence.   Neurological: Negative.   Psychiatric/Behavioral: Negative.     All other systems reviewed and are negative.   Labs/Other Tests and Data Reviewed:    Recent Labs: No results found for requested labs within last 8760 hours.   Recent Lipid Panel No results found for: CHOL, TRIG, HDL, CHOLHDL, LDLCALC, LDLDIRECT  Wt Readings from Last 3 Encounters:  03/11/19 104 lb (47.2 kg)  02/09/19 108 lb (49 kg)  12/14/18 107 lb (48.5 kg)     Exam:    Vital Signs:  BP (!) 134/53 Comment: pt provided  Pulse 78 Comment: pt provided  Temp 98 F (36.7 C) Comment: pt provided  Ht 5' 1"  (1.549 m)   Wt 104 lb (47.2 kg) Comment: pt provided  LMP 01/29/1999   BMI 19.65 kg/m     Physical Exam  Constitutional: She is oriented to person, place, and time and well-developed, well-nourished, and in no distress.  HENT:  Head: Normocephalic and atraumatic.  Pulmonary/Chest: Effort normal.  Musculoskeletal:     Cervical back: Normal range of motion.  Neurological: She is alert and oriented to person, place, and time.  Psychiatric: Affect normal.  Nursing note and vitals reviewed.   ASSESSMENT & PLAN:     1. Female climacteric state  She will now take estriol/testosterone four times per week, 3  days vaginally and one day topically.  She verbally understands treatment plan. She will rto in 4 months for re-evaluation.   2. Herpes zoster without complication  She is encouraged to take valacyclovir as per Derm. Also advised to stay out of the sun.     COVID-19 Education: The signs and symptoms of COVID-19 were discussed with the patient and how to seek care for testing (follow up with PCP or arrange E-visit).  The importance of social distancing was discussed today.  Patient Risk:   After full review of this patients clinical status, I feel that they are at least moderate risk  at this time.     Medication Adjustments/Labs and Tests Ordered: Current medicines are reviewed at length with the patient today.  Concerns regarding medicines are outlined above.   Tests Ordered: No orders of the defined types were placed in this encounter.   Medication Changes: No orders of the defined types were placed in this encounter.   Disposition:  Follow up in 4 month(s)  Signed, Maximino Greenland, MD

## 2019-03-11 NOTE — Patient Instructions (Signed)
Shingles  Shingles is an infection. It gives you a painful skin rash and blisters that have fluid in them. Shingles is caused by the same germ (virus) that causes chickenpox. Shingles only happens in people who:  Have had chickenpox.  Have been given a shot of medicine (vaccine) to protect against chickenpox. Shingles is rare in this group. The first symptoms of shingles may be itching, tingling, or pain in an area on your skin. A rash will show on your skin a few days or weeks later. The rash is likely to be on one side of your body. The rash usually has a shape like a belt or a band. Over time, the rash turns into fluid-filled blisters. The blisters will break open, change into scabs, and dry up. Medicines may:  Help with pain and itching.  Help you get better sooner.  Help to prevent long-term problems. Follow these instructions at home: Medicines  Take over-the-counter and prescription medicines only as told by your doctor.  Put on an anti-itch cream or numbing cream where you have a rash, blisters, or scabs. Do this as told by your doctor. Helping with itching and discomfort   Put cold, wet cloths (cold compresses) on the area of the rash or blisters as told by your doctor.  Cool baths can help you feel better. Try adding baking soda or dry oatmeal to the water to lessen itching. Do not bathe in hot water. Blister and rash care  Keep your rash covered with a loose bandage (dressing).  Wear loose clothing that does not rub on your rash.  Keep your rash and blisters clean. To do this, wash the area with mild soap and cool water as told by your doctor.  Check your rash every day for signs of infection. Check for: ? More redness, swelling, or pain. ? Fluid or blood. ? Warmth. ? Pus or a bad smell.  Do not scratch your rash. Do not pick at your blisters. To help you to not scratch: ? Keep your fingernails clean and cut short. ? Wear gloves or mittens when you sleep, if  scratching is a problem. General instructions  Rest as told by your doctor.  Keep all follow-up visits as told by your doctor. This is important.  Wash your hands often with soap and water. If soap and water are not available, use hand sanitizer. Doing this lowers your chance of getting a skin infection caused by germs (bacteria).  Your infection can cause chickenpox in people who have never had chickenpox or never got a shot of chickenpox vaccine. If you have blisters that did not change into scabs yet, try not to touch other people or be around other people, especially: ? Babies. ? Pregnant women. ? Children who have areas of red, itchy, or rough skin (eczema). ? Very old people who have transplants. ? People who have a long-term (chronic) sickness, like cancer or AIDS. Contact a doctor if:  Your pain does not get better with medicine.  Your pain does not get better after the rash heals.  You have any signs of infection in the rash area. These signs include: ? More redness, swelling, or pain around the rash. ? Fluid or blood coming from the rash. ? The rash area feeling warm to the touch. ? Pus or a bad smell coming from the rash. Get help right away if:  The rash is on your face or nose.  You have pain in your face or pain by  your eye.  You lose feeling on one side of your face.  You have trouble seeing.  You have ear pain, or you have ringing in your ear.  You have a loss of taste.  Your condition gets worse. Summary  Shingles gives you a painful skin rash and blisters that have fluid in them.  Shingles is an infection. It is caused by the same germ (virus) that causes chickenpox.  Keep your rash covered with a loose bandage (dressing). Wear loose clothing that does not rub on your rash.  If you have blisters that did not change into scabs yet, try not to touch other people or be around people. This information is not intended to replace advice given to you by  your health care provider. Make sure you discuss any questions you have with your health care provider. Document Revised: 05/08/2018 Document Reviewed: 09/18/2016 Elsevier Patient Education  2020 [employer].

## 2019-03-21 ENCOUNTER — Encounter: Payer: Self-pay | Admitting: Internal Medicine

## 2019-03-25 DIAGNOSIS — B028 Zoster with other complications: Secondary | ICD-10-CM | POA: Diagnosis not present

## 2019-03-25 DIAGNOSIS — L309 Dermatitis, unspecified: Secondary | ICD-10-CM | POA: Diagnosis not present

## 2019-03-25 DIAGNOSIS — C44712 Basal cell carcinoma of skin of right lower limb, including hip: Secondary | ICD-10-CM | POA: Diagnosis not present

## 2019-03-31 ENCOUNTER — Ambulatory Visit: Payer: Medicare Other | Admitting: Allergy

## 2019-04-05 ENCOUNTER — Encounter: Payer: Self-pay | Admitting: Internal Medicine

## 2019-04-07 ENCOUNTER — Encounter (HOSPITAL_COMMUNITY): Payer: Medicare Other

## 2019-04-09 ENCOUNTER — Encounter: Payer: Self-pay | Admitting: Internal Medicine

## 2019-04-19 ENCOUNTER — Other Ambulatory Visit: Payer: Self-pay

## 2019-04-19 ENCOUNTER — Ambulatory Visit (HOSPITAL_COMMUNITY)
Admission: RE | Admit: 2019-04-19 | Discharge: 2019-04-19 | Disposition: A | Discharge status: Home / Self Care | Payer: Medicare Other | Source: Ambulatory Visit | Attending: Gastroenterology | Admitting: Gastroenterology

## 2019-04-19 DIAGNOSIS — K5090 Crohn's disease, unspecified, without complications: Secondary | ICD-10-CM | POA: Insufficient documentation

## 2019-04-19 MED ORDER — PREDNISONE 20 MG PO TABS: 20.0000 mg | ORAL_TABLET | Freq: Once | ORAL | Status: DC

## 2019-04-19 MED ORDER — SODIUM CHLORIDE 0.9 % IV SOLN: INTRAVENOUS | Status: DC

## 2019-04-19 MED ORDER — DIPHENHYDRAMINE HCL 25 MG PO CAPS: 50.0000 mg | ORAL_CAPSULE | Freq: Once | ORAL | Status: DC

## 2019-04-19 MED ORDER — SODIUM CHLORIDE 0.9 % IV SOLN
200.0000 mg | INTRAVENOUS | Status: DC
  Administered 2019-04-19: 200 mg via INTRAVENOUS
  Filled 2019-04-19: qty 20

## 2019-04-19 MED ORDER — ACETAMINOPHEN 500 MG PO TABS: 1000.0000 mg | ORAL_TABLET | Freq: Once | ORAL | Status: DC

## 2019-04-20 ENCOUNTER — Encounter: Payer: Self-pay | Admitting: Certified Nurse Midwife

## 2019-04-26 ENCOUNTER — Encounter: Payer: Self-pay | Admitting: Internal Medicine

## 2019-05-05 DIAGNOSIS — L309 Dermatitis, unspecified: Secondary | ICD-10-CM | POA: Diagnosis not present

## 2019-05-05 DIAGNOSIS — J300 Vasomotor rhinitis: Secondary | ICD-10-CM | POA: Diagnosis not present

## 2019-05-10 DIAGNOSIS — H2513 Age-related nuclear cataract, bilateral: Secondary | ICD-10-CM | POA: Diagnosis not present

## 2019-05-10 DIAGNOSIS — H25013 Cortical age-related cataract, bilateral: Secondary | ICD-10-CM | POA: Diagnosis not present

## 2019-05-18 DIAGNOSIS — Z8719 Personal history of other diseases of the digestive system: Secondary | ICD-10-CM | POA: Diagnosis not present

## 2019-06-09 DIAGNOSIS — L910 Hypertrophic scar: Secondary | ICD-10-CM | POA: Diagnosis not present

## 2019-06-09 DIAGNOSIS — L309 Dermatitis, unspecified: Secondary | ICD-10-CM | POA: Diagnosis not present

## 2019-06-14 DIAGNOSIS — Z0000 Encounter for general adult medical examination without abnormal findings: Secondary | ICD-10-CM | POA: Diagnosis not present

## 2019-06-14 DIAGNOSIS — M5090 Cervical disc disorder, unspecified, unspecified cervical region: Secondary | ICD-10-CM | POA: Diagnosis not present

## 2019-06-14 DIAGNOSIS — Z8639 Personal history of other endocrine, nutritional and metabolic disease: Secondary | ICD-10-CM | POA: Diagnosis not present

## 2019-06-14 DIAGNOSIS — R21 Rash and other nonspecific skin eruption: Secondary | ICD-10-CM | POA: Diagnosis not present

## 2019-06-14 DIAGNOSIS — Z8719 Personal history of other diseases of the digestive system: Secondary | ICD-10-CM | POA: Diagnosis not present

## 2019-06-14 DIAGNOSIS — E559 Vitamin D deficiency, unspecified: Secondary | ICD-10-CM | POA: Diagnosis not present

## 2019-06-14 DIAGNOSIS — E7800 Pure hypercholesterolemia, unspecified: Secondary | ICD-10-CM | POA: Diagnosis not present

## 2019-06-14 DIAGNOSIS — Z85828 Personal history of other malignant neoplasm of skin: Secondary | ICD-10-CM | POA: Diagnosis not present

## 2019-06-14 DIAGNOSIS — K5090 Crohn's disease, unspecified, without complications: Secondary | ICD-10-CM | POA: Diagnosis not present

## 2019-06-15 ENCOUNTER — Encounter: Payer: Self-pay | Admitting: Internal Medicine

## 2019-06-17 ENCOUNTER — Ambulatory Visit (HOSPITAL_COMMUNITY)
Admission: RE | Admit: 2019-06-17 | Discharge: 2019-06-17 | Disposition: A | Discharge status: Home / Self Care | Payer: Medicare Other | Source: Ambulatory Visit | Attending: Gastroenterology | Admitting: Gastroenterology

## 2019-06-17 ENCOUNTER — Other Ambulatory Visit: Payer: Self-pay

## 2019-06-17 DIAGNOSIS — K5090 Crohn's disease, unspecified, without complications: Secondary | ICD-10-CM | POA: Diagnosis not present

## 2019-06-17 MED ORDER — DIPHENHYDRAMINE HCL 25 MG PO CAPS: 50.0000 mg | ORAL_CAPSULE | Freq: Once | ORAL | Status: DC

## 2019-06-17 MED ORDER — SODIUM CHLORIDE 0.9 % IV SOLN
200.0000 mg | INTRAVENOUS | Status: DC
  Administered 2019-06-17: 200 mg via INTRAVENOUS
  Filled 2019-06-17: qty 20

## 2019-06-17 MED ORDER — ACETAMINOPHEN 325 MG PO TABS: 800.0000 mg | ORAL_TABLET | Freq: Once | ORAL | Status: DC

## 2019-06-17 MED ORDER — SODIUM CHLORIDE 0.9 % IV SOLN: INTRAVENOUS | Status: DC

## 2019-06-17 MED ORDER — PREDNISONE 20 MG PO TABS: 20.0000 mg | ORAL_TABLET | Freq: Once | ORAL | Status: DC

## 2019-07-12 ENCOUNTER — Encounter: Payer: Self-pay | Admitting: Internal Medicine

## 2019-07-12 ENCOUNTER — Other Ambulatory Visit: Payer: Self-pay

## 2019-07-12 ENCOUNTER — Telehealth: Payer: Self-pay

## 2019-07-12 ENCOUNTER — Telehealth (INDEPENDENT_AMBULATORY_CARE_PROVIDER_SITE_OTHER): Payer: Medicare Other | Admitting: Internal Medicine

## 2019-07-12 ENCOUNTER — Telehealth: Payer: Medicare Other | Admitting: Internal Medicine

## 2019-07-12 DIAGNOSIS — N951 Menopausal and female climacteric states: Secondary | ICD-10-CM

## 2019-07-12 NOTE — Progress Notes (Signed)
Virtual Visit via Video   This visit type was conducted due to national recommendations for restrictions regarding the COVID-19 Pandemic (e.g. social distancing) in an effort to limit this patient's exposure and mitigate transmission in our community.  Due to her co-morbid illnesses, this patient is at least at moderate risk for complications without adequate follow up.  This format is felt to be most appropriate for this patient at this time.  All issues noted in this document were discussed and addressed.  A limited physical exam was performed with this format.    This visit type was conducted due to national recommendations for restrictions regarding the COVID-19 Pandemic (e.g. social distancing) in an effort to limit this patient's exposure and mitigate transmission in our community.  Patients identity confirmed using two different identifiers.  This format is felt to be most appropriate for this patient at this time.  All issues noted in this document were discussed and addressed.  No physical exam was performed (except for noted visual exam findings with Video Visits).    Date:  07/12/2019   ID:  Cassandra Herring, DOB [birth year], MRN [id number]  Patient Location:  Home  Provider location:   Office    Chief Complaint:  "I have BHRT f/u"  History of Present Illness:    Cassandra Herring is a 74 y.o. female who presents via video conferencing for a telehealth visit today.    The patient does not have symptoms concerning for COVID-19 infection (fever, chills, cough, or new shortness of breath).   She presents today for virtual visit. She prefers this method of contact due to COVID-19 pandemic.   She presents today for f/u BHRT. She feels fairly well on her current regimen. Unfortunately, she is still having hot flashes. She reports having them 4-5x/day. She has had more episodes of this in the past with previous regimens. She has no new concerns.     Past Medical History:    Diagnosis Date  . Anemia   . Basal cell carcinoma   . Bursitis    left shoulder   . Crohn disease ([place])   . HNP (herniated nucleus pulposus)   . Mass    gluteal  . Osteopenia   . Partial tear of left subscapularis tendon   . STD (sexually transmitted disease)    Hx of genital warts  . Tendon tear    shoulder  . Thyroid nodule   . Vertigo   . Vitamin D deficiency    Past Surgical History:  Procedure Laterality Date  . BREAST SURGERY     implants-1981, removal-2003     No outpatient medications have been marked as taking for the 07/12/19 encounter (Video Visit) with Glendale Chard, MD.     Allergies:   Humira [adalimumab], Methylprednisolone, and Remicade [infliximab]   Social History   Tobacco Use  . Smoking status: Never Smoker  . Smokeless tobacco: Never Used  Vaping Use  . Vaping Use: Never used  Substance Use Topics  . Alcohol use: No    Alcohol/week: 0.0 standard drinks  . Drug use: No     Family Hx: The patient's family history includes Breast cancer in her maternal aunt; Cancer in her father; Emphysema in her mother; Heart attack in her father and mother; Kidney disease in her mother; Lymphoma in her father.  ROS:   Please see the history of present illness.    Review of Systems  Constitutional: Negative.   Respiratory: Negative.  Cardiovascular: Negative.   Gastrointestinal: Negative.   Neurological: Negative.   Psychiatric/Behavioral: Negative.     All other systems reviewed and are negative.   Labs/Other Tests and Data Reviewed:    Recent Labs: No results found for requested labs within last 8760 hours.   Recent Lipid Panel No results found for: CHOL, TRIG, HDL, CHOLHDL, LDLCALC, LDLDIRECT  Wt Readings from Last 3 Encounters:  06/17/19 107 lb (48.5 kg)  04/19/19 106 lb 1.6 oz (48.1 kg)  03/11/19 104 lb (47.2 kg)     Exam:    Vital Signs:  LMP 01/29/1999     Physical Exam Vitals and nursing note reviewed.  HENT:     Head:  Normocephalic and atraumatic.  Pulmonary:     Effort: Pulmonary effort is normal.  Musculoskeletal:     Cervical back: Normal range of motion.  Neurological:     Mental Status: She is alert and oriented to person, place, and time.  Psychiatric:        Mood and Affect: Affect normal.     ASSESSMENT & PLAN:     1. Female climacteric state  She will start MWThFSat dosing of estriol cream. She will continue with progesterone capsules nightly except Sundays. I will call in 60 day refill of estriol cream and progesterone capsules. She is encouraged to contact me in 1-2 weeks to let me know how she feels on this regimen. If her sx worsen, then I will cut back on the amount of estriol she uses weekly. She will rto in 4 months for re-evaluation.  All questions were answered to her satisfaction.    COVID-19 Education: The signs and symptoms of COVID-19 were discussed with the patient and how to seek care for testing (follow up with PCP or arrange E-visit).  The importance of social distancing was discussed today.  Patient Risk:   After full review of this patients clinical status, I feel that they are at least moderate risk at this time.  Time:   Today, I have spent 30 minutes/ seconds with the patient with telehealth technology discussing above diagnoses.     Medication Adjustments/Labs and Tests Ordered: Current medicines are reviewed at length with the patient today.  Concerns regarding medicines are outlined above.   Tests Ordered: No orders of the defined types were placed in this encounter.   Medication Changes: No orders of the defined types were placed in this encounter.   Disposition:  Follow up in 4 month(s)  Signed, Maximino Greenland, MD

## 2019-07-12 NOTE — Telephone Encounter (Signed)
PT CONSENTED TO VIRTUAL VISIT AT [facility] VISIT ON 03/11/19

## 2019-07-12 NOTE — Patient Instructions (Signed)
Exercising to Stay Healthy To become healthy and stay healthy, it is recommended that you do moderate-intensity and vigorous-intensity exercise. You can tell that you are exercising at a moderate intensity if your heart starts beating faster and you start breathing faster but can still hold a conversation. You can tell that you are exercising at a vigorous intensity if you are breathing much harder and faster and cannot hold a conversation while exercising. Exercising regularly is important. It has many health benefits, such as:  Improving overall fitness, flexibility, and endurance.  Increasing bone density.  Helping with weight control.  Decreasing body fat.  Increasing muscle strength.  Reducing stress and tension.  Improving overall health. How often should I exercise? Choose an activity that you enjoy, and set realistic goals. Your health care provider can help you make an activity plan that works for you. Exercise regularly as told by your health care provider. This may include:  Doing strength training two times a week, such as: ? Lifting weights. ? Using resistance bands. ? Push-ups. ? Sit-ups. ? Yoga.  Doing a certain intensity of exercise for a given amount of time. Choose from these options: ? A total of 150 minutes of moderate-intensity exercise every week. ? A total of 75 minutes of vigorous-intensity exercise every week. ? A mix of moderate-intensity and vigorous-intensity exercise every week. Children, pregnant women, people who have not exercised regularly, people who are overweight, and older adults may need to talk with a health care provider about what activities are safe to do. If you have a medical condition, be sure to talk with your health care provider before you start a new exercise program. What are some exercise ideas? Moderate-intensity exercise ideas include:  Walking 1 mile (1.6 km) in about 15  minutes.  Biking.  Hiking.  Golfing.  Dancing.  Water aerobics. Vigorous-intensity exercise ideas include:  Walking 4.5 miles (7.2 km) or more in about 1 hour.  Jogging or running 5 miles (8 km) in about 1 hour.  Biking 10 miles (16.1 km) or more in about 1 hour.  Lap swimming.  Roller-skating or in-line skating.  Cross-country skiing.  Vigorous competitive sports, such as football, basketball, and soccer.  Jumping rope.  Aerobic dancing. What are some everyday activities that can help me to get exercise?  Yard work, such as: ? Pushing a lawn mower. ? Raking and bagging leaves.  Washing your car.  Pushing a stroller.  Shoveling snow.  Gardening.  Washing windows or floors. How can I be more active in my day-to-day activities?  Use stairs instead of an elevator.  Take a walk during your lunch break.  If you drive, park your car farther away from your work or school.  If you take public transportation, get off one stop early and walk the rest of the way.  Stand up or walk around during all of your indoor phone calls.  Get up, stretch, and walk around every 30 minutes throughout the day.  Enjoy exercise with a friend. Support to continue exercising will help you keep a regular routine of activity. What guidelines can I follow while exercising?  Before you start a new exercise program, talk with your health care provider.  Do not exercise so much that you hurt yourself, feel dizzy, or get very short of breath.  Wear comfortable clothes and wear shoes with good support.  Drink plenty of water while you exercise to prevent dehydration or heat stroke.  Work out until your breathing   and your heartbeat get faster. Where to find more information  [place] Department of Health and Human Services: www.hhs.gov  Centers for Disease Control and Prevention (CDC): www.cdc.gov Summary  Exercising regularly is important. It will improve your overall fitness,  flexibility, and endurance.  Regular exercise also will improve your overall health. It can help you control your weight, reduce stress, and improve your bone density.  Do not exercise so much that you hurt yourself, feel dizzy, or get very short of breath.  Before you start a new exercise program, talk with your health care provider. This information is not intended to replace advice given to you by your health care provider. Make sure you discuss any questions you have with your health care provider. Document Revised: 12/27/2016 Document Reviewed: 12/05/2016 Elsevier Patient Education  2020 [employer]  

## 2019-07-29 ENCOUNTER — Encounter: Payer: Self-pay | Admitting: Internal Medicine

## 2019-08-13 ENCOUNTER — Encounter: Payer: Self-pay | Admitting: Internal Medicine

## 2019-08-16 ENCOUNTER — Ambulatory Visit (HOSPITAL_COMMUNITY)
Admission: RE | Admit: 2019-08-16 | Discharge: 2019-08-16 | Disposition: A | Discharge status: Home / Self Care | Payer: Medicare Other | Source: Ambulatory Visit | Attending: Gastroenterology | Admitting: Gastroenterology

## 2019-08-16 ENCOUNTER — Other Ambulatory Visit: Payer: Self-pay

## 2019-08-16 DIAGNOSIS — K5090 Crohn's disease, unspecified, without complications: Secondary | ICD-10-CM | POA: Diagnosis not present

## 2019-08-16 MED ORDER — PREDNISONE 20 MG PO TABS: 20.0000 mg | ORAL_TABLET | Freq: Every day | ORAL | Status: DC

## 2019-08-16 MED ORDER — SODIUM CHLORIDE 0.9 % IV SOLN: INTRAVENOUS | Status: DC

## 2019-08-16 MED ORDER — ACETAMINOPHEN 325 MG PO TABS: 812.5000 mg | ORAL_TABLET | Freq: Once | ORAL | Status: DC

## 2019-08-16 MED ORDER — DIPHENHYDRAMINE HCL 25 MG PO CAPS: 50.0000 mg | ORAL_CAPSULE | Freq: Once | ORAL | Status: DC

## 2019-08-16 MED ORDER — SODIUM CHLORIDE 0.9 % IV SOLN
200.0000 mg | INTRAVENOUS | Status: DC
  Administered 2019-08-16: 200 mg via INTRAVENOUS
  Filled 2019-08-16: qty 20

## 2019-09-03 ENCOUNTER — Encounter: Payer: Self-pay | Admitting: Internal Medicine

## 2019-09-10 DIAGNOSIS — Z8639 Personal history of other endocrine, nutritional and metabolic disease: Secondary | ICD-10-CM | POA: Diagnosis not present

## 2019-09-10 DIAGNOSIS — R21 Rash and other nonspecific skin eruption: Secondary | ICD-10-CM | POA: Diagnosis not present

## 2019-09-10 DIAGNOSIS — E559 Vitamin D deficiency, unspecified: Secondary | ICD-10-CM | POA: Diagnosis not present

## 2019-09-10 DIAGNOSIS — E7800 Pure hypercholesterolemia, unspecified: Secondary | ICD-10-CM | POA: Diagnosis not present

## 2019-09-10 DIAGNOSIS — Z85828 Personal history of other malignant neoplasm of skin: Secondary | ICD-10-CM | POA: Diagnosis not present

## 2019-09-10 DIAGNOSIS — Z8719 Personal history of other diseases of the digestive system: Secondary | ICD-10-CM | POA: Diagnosis not present

## 2019-09-10 DIAGNOSIS — M5090 Cervical disc disorder, unspecified, unspecified cervical region: Secondary | ICD-10-CM | POA: Diagnosis not present

## 2019-09-10 DIAGNOSIS — K5090 Crohn's disease, unspecified, without complications: Secondary | ICD-10-CM | POA: Diagnosis not present

## 2019-09-16 DIAGNOSIS — L719 Rosacea, unspecified: Secondary | ICD-10-CM | POA: Diagnosis not present

## 2019-09-16 DIAGNOSIS — L309 Dermatitis, unspecified: Secondary | ICD-10-CM | POA: Diagnosis not present

## 2019-09-16 DIAGNOSIS — Z85828 Personal history of other malignant neoplasm of skin: Secondary | ICD-10-CM | POA: Diagnosis not present

## 2019-09-24 ENCOUNTER — Encounter: Payer: Medicare Other | Admitting: Obstetrics & Gynecology

## 2019-10-13 ENCOUNTER — Telehealth: Payer: Self-pay | Admitting: Family Medicine

## 2019-10-13 NOTE — Telephone Encounter (Signed)
°   Cassandra Herring DOB: 11/15/1945 MRN: [id number]   RIDER WAIVER AND RELEASE OF LIABILITY  For purposes of improving physical access to our facilities, [facility] is pleased to partner with third parties to provide [facility] patients or other authorized individuals the option of convenient, on-demand ground transportation services (the [employer]) through use of the technology service that enables users to request on-demand ground transportation from independent third-party providers.  By opting to use and accept these [employer], I, the undersigned, hereby agree on behalf of myself, and on behalf of any minor child using the [employer] for whom I am the parent or legal guardian, as follows:  1. Government social research officer provided to me are provided by independent third-party transportation providers who are not [employer] or employees and who are unaffiliated with [employer]. 2. [facility] is neither a transportation carrier nor a common or public carrier. 3. [facility] has no control over the quality or safety of the transportation that occurs as a result of the [employer]. 4. [facility] cannot guarantee that any third-party transportation provider will complete any arranged transportation service. 5. [facility] makes no representation, warranty, or guarantee regarding the reliability, timeliness, quality, safety, suitability, or availability of any of the Transport Services or that they will be error free. 6. I fully understand that traveling by vehicle involves risks and dangers of serious bodily injury, including permanent disability, paralysis, and death. I agree, on behalf of myself and on behalf of any minor child using the Transport Services for whom I am the parent or legal guardian, that the entire risk arising out of my use of the [employer] remains solely with me, to the maximum extent permitted under applicable law. 7. The [employer] are provided as is and as available. [facility] disclaims all representations and warranties, express, implied or statutory, not expressly set out in these terms, including the implied warranties of merchantability and fitness for a particular purpose. 8. I hereby waive and release [facility], its agents, employees, officers, directors, representatives, insurers, attorneys, assigns, successors, subsidiaries, and affiliates from any and all past, present, or future claims, demands, liabilities, actions, causes of action, or suits of any kind directly or indirectly arising from acceptance and use of the [employer]. 9. I further waive and release [facility] and its affiliates from all present and future liability and responsibility for any injury or death to persons or damages to property caused by or related to the use of the [employer]. 10. I have read this Waiver and Release of Liability, and I understand the terms used in it and their legal significance. This Waiver is freely and voluntarily given with the understanding that my right (as well as the right of any minor child for whom I am the parent or legal guardian using the [employer]) to legal recourse against  in connection with the [employer] is knowingly surrendered in return for use of these services.   I attest that I read the consent document to Cassandra Herring, gave [name] the opportunity to ask questions and answered the questions asked (if any). I affirm that Cassandra Herring then provided consent for she's participation in this program.     Cassandra Herring

## 2019-10-15 ENCOUNTER — Other Ambulatory Visit: Payer: Self-pay

## 2019-10-15 ENCOUNTER — Ambulatory Visit (HOSPITAL_COMMUNITY)
Admission: RE | Admit: 2019-10-15 | Discharge: 2019-10-15 | Disposition: A | Discharge status: Home / Self Care | Payer: Medicare Other | Source: Ambulatory Visit | Attending: Gastroenterology | Admitting: Gastroenterology

## 2019-10-15 DIAGNOSIS — K5090 Crohn's disease, unspecified, without complications: Secondary | ICD-10-CM | POA: Insufficient documentation

## 2019-10-15 MED ORDER — SODIUM CHLORIDE 0.9 % IV SOLN
200.0000 mg | INTRAVENOUS | Status: DC
  Administered 2019-10-15: 200 mg via INTRAVENOUS
  Filled 2019-10-15: qty 20

## 2019-10-15 MED ORDER — ACETAMINOPHEN 325 MG PO TABS: 812.5000 mg | ORAL_TABLET | Freq: Once | ORAL | Status: DC

## 2019-10-15 MED ORDER — PREDNISONE 20 MG PO TABS: 20.0000 mg | ORAL_TABLET | Freq: Every day | ORAL | Status: DC

## 2019-10-15 MED ORDER — DIPHENHYDRAMINE HCL 25 MG PO CAPS: 50.0000 mg | ORAL_CAPSULE | Freq: Once | ORAL | Status: DC

## 2019-10-15 MED ORDER — SODIUM CHLORIDE 0.9 % IV SOLN: INTRAVENOUS | Status: DC

## 2019-10-19 ENCOUNTER — Encounter: Payer: Self-pay | Admitting: Internal Medicine

## 2019-10-19 ENCOUNTER — Other Ambulatory Visit: Payer: Self-pay

## 2019-10-19 ENCOUNTER — Encounter: Payer: Self-pay | Admitting: Obstetrics & Gynecology

## 2019-10-19 ENCOUNTER — Ambulatory Visit (INDEPENDENT_AMBULATORY_CARE_PROVIDER_SITE_OTHER): Payer: Medicare Other | Admitting: Obstetrics & Gynecology

## 2019-10-19 VITALS — BP 128/80 | Ht 61.0 in | Wt 106.0 lb

## 2019-10-19 DIAGNOSIS — Z79890 Hormone replacement therapy (postmenopausal): Secondary | ICD-10-CM | POA: Diagnosis not present

## 2019-10-19 DIAGNOSIS — Z9289 Personal history of other medical treatment: Secondary | ICD-10-CM | POA: Diagnosis not present

## 2019-10-19 DIAGNOSIS — M8580 Other specified disorders of bone density and structure, unspecified site: Secondary | ICD-10-CM | POA: Diagnosis not present

## 2019-10-19 DIAGNOSIS — Z8619 Personal history of other infectious and parasitic diseases: Secondary | ICD-10-CM | POA: Diagnosis not present

## 2019-10-19 DIAGNOSIS — Z01419 Encounter for gynecological examination (general) (routine) without abnormal findings: Secondary | ICD-10-CM

## 2019-10-19 NOTE — Progress Notes (Signed)
Cassandra Herring 08/31/1945 [id number]   History:    74 y.o.  G0  Married  RP: Established patient presenting for annual gyn exam   HPI: Menopause on HRT x 20 yrs.  Still on systemic Estriol gel and Prometrium 100 mg HS through [facility].  [place] to stop when I recommended it in 10/2017, but had severe hot flushes and night sweats, so restarted it through Dr Tye Savoy.  No PMB. No pelvic pain.  Rarely sexually active.  H/O Silicone breast implants which ruptured and were removed.  Last Mammo 2019, declines repeating a mammo because has too little breast tissue/scarring missed that you have a good time to fit in.  BMI 20.03 .  Walking regularly. Taking Vit D and Ca++.  Osteopenia BD 09/2017 at [facility], will schedule a BD now.  Health labs with [facility].  Crohn's disease stable.  Past medical history,surgical history, family history and social history were all reviewed and documented in the EPIC chart.  Gynecologic History Patient's last menstrual period was 01/29/1999.  Obstetric History OB History  Gravida Para Term Preterm AB Living  0 0 0 0 0 0  SAB TAB Ectopic Multiple Live Births  0 0 0 0       ROS: A ROS was performed and pertinent positives and negatives are included in the history.  GENERAL: No fevers or chills. HEENT: No change in vision, no earache, sore throat or sinus congestion. NECK: No pain or stiffness. CARDIOVASCULAR: No chest pain or pressure. No palpitations. PULMONARY: No shortness of breath, cough or wheeze. GASTROINTESTINAL: No abdominal pain, nausea, vomiting or diarrhea, melena or bright red blood per rectum. GENITOURINARY: No urinary frequency, urgency, hesitancy or dysuria. MUSCULOSKELETAL: No joint or muscle pain, no back pain, no recent trauma. DERMATOLOGIC: No rash, no itching, no lesions. ENDOCRINE: No polyuria, polydipsia, no heat or cold intolerance. No recent change in weight. HEMATOLOGICAL: No anemia or easy bruising or bleeding. NEUROLOGIC: No  headache, seizures, numbness, tingling or weakness. PSYCHIATRIC: No depression, no loss of interest in normal activity or change in sleep pattern.     Exam:   BP 128/80 (BP Location: Right Arm, Patient Position: Sitting, Cuff Size: Normal)   Ht 5' 1"  (1.549 m)   Wt 106 lb (48.1 kg)   LMP 01/29/1999   BMI 20.03 kg/m   Body mass index is 20.03 kg/m.  General appearance : Well developed well nourished female. No acute distress HEENT: Eyes: no retinal hemorrhage or exudates,  Neck supple, trachea midline, no carotid bruits, no thyroidmegaly Lungs: Clear to auscultation, no rhonchi or wheezes, or rib retractions  Heart: Regular rate and rhythm, no murmurs or gallops Breast:Examined in sitting and supine position were symmetrical in appearance, no palpable masses or tenderness,  no skin retraction, no nipple inversion, no nipple discharge, no skin discoloration, no axillary or supraclavicular lymphadenopathy Abdomen: no palpable masses or tenderness, no rebound or guarding Extremities: no edema or skin discoloration or tenderness  Pelvic: Vulva: Normal             Vagina: No gross lesions or discharge  Cervix: No gross lesions or discharge  Uterus  AV, normal size, shape and consistency, non-tender and mobile  Adnexa  Without masses or tenderness  Anus: Normal   Assessment/Plan:  74 y.o. female for annual exam   1. Well female exam with routine gynecological exam Normal gynecologic exam in menopause.  No indication to repeat a Pap test this year.  Breast exam  normal.  Recommend to continue screening mammogram, but patient declines due to very little tissue and scarring which makes the mammography difficult per patient.  Health labs and colonoscopy to schedule through family physician.  Body mass index 20.03.  Continue with fitness and healthy nutrition.  2. Post-menopause on HRT (hormone replacement therapy) On hormone replacement therapy for more than 20 years.  Hormone replacement  therapy prescribed by [facility], Dr. Tye Savoy.  Strongly recommend to stop hormone replacement therapy given risks of strokes and breast cancer higher than benefits.  Patient agreed to wean hormone replacement therapy.  We will start decreasing the amount of estriol gel used and will continue on Prometrium 100 mg per mouth daily at bedtime.  When done using the estriol gel, will call to get a prescription of estradiol patch 0.0375 twice weekly and continue the Prometrium 100 mg per mouth at bedtime.  Will then progressively wean the estradiol patch until she can stop completely.  3. Osteopenia, unspecified location Will schedule a repeat BD at [facility] now.  Continue Vit D and Ca++ 1500 mg daily.  Weight bearing physical activities.  4. H/O herpes genitalis No recurrence x 01/2019.  Other orders - NON FORMULARY; [employer]  Princess Bruins MD, 12:13 PM 10/19/2019

## 2019-10-21 ENCOUNTER — Encounter: Payer: Self-pay | Admitting: Internal Medicine

## 2019-10-21 DIAGNOSIS — K50119 Crohn's disease of large intestine with unspecified complications: Secondary | ICD-10-CM | POA: Diagnosis not present

## 2019-10-21 DIAGNOSIS — M8589 Other specified disorders of bone density and structure, multiple sites: Secondary | ICD-10-CM | POA: Diagnosis not present

## 2019-10-21 LAB — HM DEXA SCAN

## 2019-10-28 ENCOUNTER — Other Ambulatory Visit: Payer: Self-pay | Admitting: Internal Medicine

## 2019-10-28 ENCOUNTER — Encounter: Payer: Self-pay | Admitting: Internal Medicine

## 2019-10-28 MED ORDER — BLACK COHOSH 20 MG PO TABS: ORAL_TABLET | ORAL | 0 refills | Status: DC

## 2019-10-29 ENCOUNTER — Encounter: Payer: Self-pay | Admitting: Internal Medicine

## 2019-11-05 ENCOUNTER — Telehealth: Payer: Self-pay | Admitting: *Deleted

## 2019-11-05 MED ORDER — PROGESTERONE MICRONIZED 100 MG PO CAPS: 100.0000 mg | ORAL_CAPSULE | Freq: Every day | ORAL | 11 refills | Status: DC

## 2019-11-05 NOTE — Telephone Encounter (Signed)
Patient called to follow up from my message on 10/20/19. Patient is weaning off estradiol gel, needs refill on progesterone 100 mg capsules. Rx sent.

## 2019-11-15 ENCOUNTER — Encounter: Payer: Self-pay | Admitting: Internal Medicine

## 2019-11-15 ENCOUNTER — Other Ambulatory Visit: Payer: Self-pay

## 2019-11-15 ENCOUNTER — Telehealth (INDEPENDENT_AMBULATORY_CARE_PROVIDER_SITE_OTHER): Payer: Medicare Other | Admitting: Internal Medicine

## 2019-11-15 VITALS — BP 117/45 | HR 69 | Ht 61.0 in | Wt 106.0 lb

## 2019-11-15 DIAGNOSIS — M8580 Other specified disorders of bone density and structure, unspecified site: Secondary | ICD-10-CM | POA: Diagnosis not present

## 2019-11-15 DIAGNOSIS — N951 Menopausal and female climacteric states: Secondary | ICD-10-CM

## 2019-11-15 DIAGNOSIS — Z2821 Immunization not carried out because of patient refusal: Secondary | ICD-10-CM | POA: Diagnosis not present

## 2019-11-15 DIAGNOSIS — Z780 Asymptomatic menopausal state: Secondary | ICD-10-CM

## 2019-11-15 MED ORDER — ESTRIOL 10 % CREA
TOPICAL_CREAM | 0 refills | Status: DC
Start: 2019-11-15 — End: 2020-03-01

## 2019-11-15 NOTE — Progress Notes (Signed)
Virtual Visit via Video   This visit type was conducted due to national recommendations for restrictions regarding the COVID-19 Pandemic (e.g. social distancing) in an effort to limit this patient's exposure and mitigate transmission in our community.  Due to her co-morbid illnesses, this patient is at least at moderate risk for complications without adequate follow up.  This format is felt to be most appropriate for this patient at this time.  All issues noted in this document were discussed and addressed.  A limited physical exam was performed with this format.    This visit type was conducted due to national recommendations for restrictions regarding the COVID-19 Pandemic (e.g. social distancing) in an effort to limit this patient's exposure and mitigate transmission in our community.  Patients identity confirmed using two different identifiers.  This format is felt to be most appropriate for this patient at this time.  All issues noted in this document were discussed and addressed.  No physical exam was performed (except for noted visual exam findings with Video Visits).    Date:  12/05/2019   ID:  Cassandra Herring, DOB 09/21/1945, MRN [id number]  Patient Location:  Home  Provider location:   Office    Chief Complaint:  "I have BHRT f/u"  History of Present Illness:    Cassandra Herring is a 74 y.o. female who presents via video conferencing for a telehealth visit today.  She prefers virtual appts due to COVID-19 pandemic. She is down to twice weekly use of estriol cream. She uses on Wed/Sat topical/vaginally. She reports her GYN is willing to transition her to estradiol patch if needed.  She is still having some hot flashes. Now has Remifemin, but has yet to start it.   The patient does not have symptoms concerning for COVID-19 infection (fever, chills, cough, or new shortness of breath).    Past Medical History:  Diagnosis Date  . Anemia   . Basal cell carcinoma   .  Bursitis    left shoulder   . Crohn disease ([place])   . HNP (herniated nucleus pulposus)   . Mass    gluteal  . Osteopenia   . Partial tear of left subscapularis tendon   . STD (sexually transmitted disease)    Hx of genital warts  . Tendon tear    shoulder  . Thyroid nodule   . Vertigo   . Vitamin D deficiency    Past Surgical History:  Procedure Laterality Date  . BREAST SURGERY     implants-1981, removal-2003     No outpatient medications have been marked as taking for the 11/15/19 encounter (Telemedicine) with Glendale Chard, MD.     Allergies:   Humira [adalimumab], Methylprednisolone, and Remicade [infliximab]   Social History   Tobacco Use  . Smoking status: Never Smoker  . Smokeless tobacco: Never Used  Vaping Use  . Vaping Use: Never used  Substance Use Topics  . Alcohol use: No    Alcohol/week: 0.0 standard drinks  . Drug use: No     Family Hx: The patient's family history includes Breast cancer in her maternal aunt; Cancer in her father; Emphysema in her mother; Heart attack in her father and mother; Kidney disease in her mother; Lymphoma in her father.  ROS:   Please see the history of present illness.    Review of Systems  Constitutional: Negative.   Respiratory: Negative.   Cardiovascular: Negative.   Gastrointestinal: Negative.   Neurological: Negative.  Psychiatric/Behavioral: Negative.     All other systems reviewed and are negative.   Labs/Other Tests and Data Reviewed:    Recent Labs: No results found for requested labs within last 8760 hours.   Recent Lipid Panel No results found for: CHOL, TRIG, HDL, CHOLHDL, LDLCALC, LDLDIRECT  Wt Readings from Last 3 Encounters:  11/15/19 106 lb (48.1 kg)  10/19/19 106 lb (48.1 kg)  10/15/19 106 lb (48.1 kg)     Exam:    Vital Signs:  BP (!) 117/45   Pulse 69   Ht 5' 1"  (1.549 m)   Wt 106 lb (48.1 kg)   LMP 01/29/1999   BMI 20.03 kg/m     Physical Exam Vitals and nursing note  reviewed.  Constitutional:      Appearance: Normal appearance.  HENT:     Head: Normocephalic and atraumatic.  Pulmonary:     Effort: Pulmonary effort is normal.     Comments: No labored breathing.  Neurological:     Mental Status: She is alert and oriented to person, place, and time.  Psychiatric:        Mood and Affect: Affect normal.     ASSESSMENT & PLAN:    1. Female climacteric state Comments: Chronic. She plans to switch to once weekly estrogen in two weeks. She plans to stay on progesterone therapy as per GYN. She reports her GYN will take over progesterone supplementation as well. She agrees to f/u in four months as needed.   2. Osteopenia after menopause Comments: Bone density results reviewed in full detail. She is upset that she has had some bone loss. She plans to increase her weight-bearing exercises. She will continue with calcium,vitamin D supplementation.   3. COVID-19 vaccination declined    COVID-19 Education: The signs and symptoms of COVID-19 were discussed with the patient and how to seek care for testing (follow up with PCP or arrange E-visit).  The importance of social distancing was discussed today.  Patient Risk:   After full review of this patients clinical status, I feel that they are at least moderate risk at this time.  Time:   Today, I have spent 28 minutes/ seconds with the patient with telehealth technology discussing above diagnoses.     Medication Adjustments/Labs and Tests Ordered: Current medicines are reviewed at length with the patient today.  Concerns regarding medicines are outlined above.   Tests Ordered: No orders of the defined types were placed in this encounter.   Medication Changes: Meds ordered this encounter  Medications  . Estriol 10 % CREA    Sig: 0.25 mg twice weekly x 2 weeks, then she will stop    Dispense:  25 g    Refill:  0    Disposition:  Follow up: 4 months prn.   Signed, Maximino Greenland, MD

## 2019-12-01 MED ORDER — PROGESTERONE MICRONIZED 100 MG PO CAPS: 100.0000 mg | ORAL_CAPSULE | Freq: Every day | ORAL | 4 refills | Status: DC

## 2019-12-08 ENCOUNTER — Ambulatory Visit (HOSPITAL_COMMUNITY): Payer: Medicare Other

## 2019-12-13 ENCOUNTER — Telehealth: Payer: Self-pay | Admitting: Family Medicine

## 2019-12-16 ENCOUNTER — Ambulatory Visit (HOSPITAL_COMMUNITY)
Admission: RE | Admit: 2019-12-16 | Discharge: 2019-12-16 | Disposition: A | Discharge status: Home / Self Care | Payer: Medicare Other | Source: Ambulatory Visit | Attending: Gastroenterology | Admitting: Gastroenterology

## 2019-12-16 ENCOUNTER — Other Ambulatory Visit: Payer: Self-pay

## 2019-12-16 DIAGNOSIS — K5090 Crohn's disease, unspecified, without complications: Secondary | ICD-10-CM | POA: Insufficient documentation

## 2019-12-16 MED ORDER — DIPHENHYDRAMINE HCL 25 MG PO CAPS: 50.0000 mg | ORAL_CAPSULE | Freq: Once | ORAL | Status: DC

## 2019-12-16 MED ORDER — ACETAMINOPHEN 325 MG PO TABS: 650.0000 mg | ORAL_TABLET | Freq: Once | ORAL | Status: DC

## 2019-12-16 MED ORDER — PREDNISONE 20 MG PO TABS: 20.0000 mg | ORAL_TABLET | Freq: Every day | ORAL | Status: DC

## 2019-12-16 MED ORDER — SODIUM CHLORIDE 0.9 % IV SOLN
200.0000 mg | INTRAVENOUS | Status: DC
  Administered 2019-12-16: 200 mg via INTRAVENOUS
  Filled 2019-12-16: qty 20

## 2019-12-16 MED ORDER — ACETAMINOPHEN 325 MG PO TABS: 812.5000 mg | ORAL_TABLET | Freq: Once | ORAL | Status: DC

## 2019-12-16 MED ORDER — SODIUM CHLORIDE 0.9 % IV SOLN: INTRAVENOUS | Status: DC

## 2019-12-25 ENCOUNTER — Encounter: Payer: Self-pay | Admitting: Internal Medicine

## 2019-12-25 DIAGNOSIS — F5109 Other insomnia not due to a substance or known physiological condition: Secondary | ICD-10-CM | POA: Diagnosis not present

## 2020-01-07 DIAGNOSIS — R2231 Localized swelling, mass and lump, right upper limb: Secondary | ICD-10-CM | POA: Diagnosis not present

## 2020-01-07 DIAGNOSIS — Z6820 Body mass index (BMI) 20.0-20.9, adult: Secondary | ICD-10-CM | POA: Diagnosis not present

## 2020-01-07 DIAGNOSIS — F5101 Primary insomnia: Secondary | ICD-10-CM | POA: Diagnosis not present

## 2020-02-15 ENCOUNTER — Ambulatory Visit (HOSPITAL_COMMUNITY)
Admission: RE | Admit: 2020-02-15 | Discharge: 2020-02-15 | Disposition: A | Discharge status: Home / Self Care | Payer: Medicare Other | Source: Ambulatory Visit | Attending: Gastroenterology | Admitting: Gastroenterology

## 2020-02-15 ENCOUNTER — Other Ambulatory Visit: Payer: Self-pay

## 2020-02-15 DIAGNOSIS — K5090 Crohn's disease, unspecified, without complications: Secondary | ICD-10-CM | POA: Diagnosis not present

## 2020-02-15 MED ORDER — SODIUM CHLORIDE 0.9 % IV SOLN
200.0000 mg | INTRAVENOUS | Status: DC
  Administered 2020-02-15: 200 mg via INTRAVENOUS
  Filled 2020-02-15: qty 20

## 2020-02-15 MED ORDER — DIPHENHYDRAMINE HCL 25 MG PO CAPS: 50.0000 mg | ORAL_CAPSULE | Freq: Once | ORAL | Status: DC

## 2020-02-15 MED ORDER — SODIUM CHLORIDE 0.9 % IV SOLN: INTRAVENOUS | Status: DC

## 2020-02-15 MED ORDER — PREDNISONE 20 MG PO TABS: 20.0000 mg | ORAL_TABLET | Freq: Every day | ORAL | Status: DC

## 2020-02-15 MED ORDER — ACETAMINOPHEN 325 MG PO TABS: 650.0000 mg | ORAL_TABLET | Freq: Once | ORAL | Status: DC

## 2020-02-18 ENCOUNTER — Encounter (HOSPITAL_COMMUNITY): Payer: Medicare Other

## 2020-02-18 DIAGNOSIS — G4700 Insomnia, unspecified: Secondary | ICD-10-CM | POA: Diagnosis not present

## 2020-02-18 DIAGNOSIS — F4323 Adjustment disorder with mixed anxiety and depressed mood: Secondary | ICD-10-CM | POA: Diagnosis not present

## 2020-02-18 DIAGNOSIS — F4321 Adjustment disorder with depressed mood: Secondary | ICD-10-CM | POA: Diagnosis not present

## 2020-02-22 DIAGNOSIS — F4323 Adjustment disorder with mixed anxiety and depressed mood: Secondary | ICD-10-CM | POA: Diagnosis not present

## 2020-02-22 DIAGNOSIS — F4321 Adjustment disorder with depressed mood: Secondary | ICD-10-CM | POA: Diagnosis not present

## 2020-02-22 DIAGNOSIS — G4700 Insomnia, unspecified: Secondary | ICD-10-CM | POA: Diagnosis not present

## 2020-03-01 ENCOUNTER — Ambulatory Visit (INDEPENDENT_AMBULATORY_CARE_PROVIDER_SITE_OTHER): Payer: Medicare Other | Admitting: Obstetrics & Gynecology

## 2020-03-01 ENCOUNTER — Other Ambulatory Visit: Payer: Self-pay

## 2020-03-01 ENCOUNTER — Encounter: Payer: Self-pay | Admitting: Obstetrics & Gynecology

## 2020-03-01 VITALS — BP 140/78 | HR 66 | Resp 12 | Ht 61.0 in | Wt 108.2 lb

## 2020-03-01 DIAGNOSIS — N952 Postmenopausal atrophic vaginitis: Secondary | ICD-10-CM | POA: Diagnosis not present

## 2020-03-01 DIAGNOSIS — R3915 Urgency of urination: Secondary | ICD-10-CM

## 2020-03-01 MED ORDER — ESTRADIOL 0.1 MG/GM VA CREA: 1.0000 | TOPICAL_CREAM | VAGINAL | 4 refills | Status: DC

## 2020-03-01 NOTE — Progress Notes (Signed)
    Cassandra Herring [birth year] [id number]        75 y.o.  G0  RP: Urinary urgency x a few months  HPI: Drinking a lot of fluids.  Very little caffeine products.  Most of the time, patient's bladder is full and needs to be emptied when having urgency symptoms.  Occasionally leaks some urine on her way to the bathroom with the feeling of urgency.  No blood seen in urine.  No fever.     OB History  Gravida Para Term Preterm AB Living  0 0 0 0 0 0  SAB IAB Ectopic Multiple Live Births  0 0 0 0      Past medical history,surgical history, problem list, medications, allergies, family history and social history were all reviewed and documented in the EPIC chart.   Directed ROS with pertinent positives and negatives documented in the history of present illness/assessment and plan.  Exam:  Vitals:   03/01/20 1501  BP: 140/78  Pulse: 66  Resp: 12  Weight: 108 lb 4 oz (49.1 kg)  Height: 5' 1"  (1.549 m)   General appearance:  Normal  CVAT Negative bilaterally  Abdomen: Normal  Gynecologic exam: Vulva with atrophy of menopause, no erythema, no lesion.    U/A: Yellow clear, nitrites negative, proteins negative, white blood cells 6-10, red blood cells 0-2, bacteria few.  Urine culture pending.   Assessment/Plan:  75 y.o. G0P0000   1. Urgency of urination Very mild U/A perturbation.  Will wait on urine culture to decide on treatment.  Recommend managing her water intake.  Kegel exercises.  Will start on Estradiol cream at the vulva/peri-urethra twice a week. - Urinalysis,Complete w/RFL Culture  2. Postmenopausal atrophic vaginitis Decision to start on Estradiol cream at the vulva twice a week to help with bladder symptoms of urgency.  Other orders - LORazepam (ATIVAN) 1 MG tablet; Take 1 mg by mouth daily as needed. - estradiol (ESTRACE) 0.1 MG/GM vaginal cream; Place 1 Applicatorful vaginally 2 (two) times a week.  Princess Bruins MD, 3:12 PM 03/01/2020

## 2020-03-03 ENCOUNTER — Other Ambulatory Visit: Payer: Self-pay | Admitting: *Deleted

## 2020-03-03 ENCOUNTER — Telehealth: Payer: Self-pay | Admitting: *Deleted

## 2020-03-03 LAB — CULTURE INDICATED

## 2020-03-03 LAB — URINALYSIS, COMPLETE W/RFL CULTURE
Bilirubin Urine: NEGATIVE
Glucose, UA: NEGATIVE
Hyaline Cast: NONE SEEN /LPF
Ketones, ur: NEGATIVE
Nitrites, Initial: NEGATIVE
Protein, ur: NEGATIVE
Specific Gravity, Urine: 1.01 (ref 1.001–1.03)
pH: 6.5 (ref 5.0–8.0)

## 2020-03-03 LAB — URINE CULTURE
MICRO NUMBER:: 11486724
SPECIMEN QUALITY:: ADEQUATE

## 2020-03-03 MED ORDER — AMOXICILLIN 500 MG PO CAPS: 500.0000 mg | ORAL_CAPSULE | Freq: Three times a day (TID) | ORAL | 0 refills | Status: DC

## 2020-03-03 NOTE — Telephone Encounter (Signed)
Update from result not conversation 03/01/20. Patient called asking if amoxicillin 500 mg can be sent to different [facility] the other one is not in stock. Rx sent.

## 2020-03-13 DIAGNOSIS — E7800 Pure hypercholesterolemia, unspecified: Secondary | ICD-10-CM | POA: Diagnosis not present

## 2020-03-13 DIAGNOSIS — K5090 Crohn's disease, unspecified, without complications: Secondary | ICD-10-CM | POA: Diagnosis not present

## 2020-03-13 DIAGNOSIS — Z8639 Personal history of other endocrine, nutritional and metabolic disease: Secondary | ICD-10-CM | POA: Diagnosis not present

## 2020-03-13 DIAGNOSIS — E559 Vitamin D deficiency, unspecified: Secondary | ICD-10-CM | POA: Diagnosis not present

## 2020-03-16 DIAGNOSIS — F4321 Adjustment disorder with depressed mood: Secondary | ICD-10-CM | POA: Diagnosis not present

## 2020-03-16 DIAGNOSIS — E559 Vitamin D deficiency, unspecified: Secondary | ICD-10-CM | POA: Diagnosis not present

## 2020-03-16 DIAGNOSIS — F4323 Adjustment disorder with mixed anxiety and depressed mood: Secondary | ICD-10-CM | POA: Diagnosis not present

## 2020-03-16 DIAGNOSIS — Z1389 Encounter for screening for other disorder: Secondary | ICD-10-CM | POA: Diagnosis not present

## 2020-03-16 DIAGNOSIS — E7800 Pure hypercholesterolemia, unspecified: Secondary | ICD-10-CM | POA: Diagnosis not present

## 2020-03-16 DIAGNOSIS — Z8639 Personal history of other endocrine, nutritional and metabolic disease: Secondary | ICD-10-CM | POA: Diagnosis not present

## 2020-03-16 DIAGNOSIS — K5090 Crohn's disease, unspecified, without complications: Secondary | ICD-10-CM | POA: Diagnosis not present

## 2020-03-16 DIAGNOSIS — Z0000 Encounter for general adult medical examination without abnormal findings: Secondary | ICD-10-CM | POA: Diagnosis not present

## 2020-03-16 DIAGNOSIS — F5101 Primary insomnia: Secondary | ICD-10-CM | POA: Diagnosis not present

## 2020-03-24 ENCOUNTER — Ambulatory Visit: Payer: Self-pay

## 2020-03-24 NOTE — Telephone Encounter (Signed)
Pt has a question about if the vaccine sites are able to handle her health issue and if she can get vaccine in another place other than her arm/ please advise   Pt. Asking if emergency medical services are available at vaccination sites for COVID 19. Instructed her that there is.Also may want the vaccine given in her leg. Instructed to talk with them on site.

## 2020-04-14 ENCOUNTER — Ambulatory Visit (HOSPITAL_COMMUNITY)
Admission: RE | Admit: 2020-04-14 | Discharge: 2020-04-14 | Disposition: A | Discharge status: Home / Self Care | Payer: Medicare Other | Source: Ambulatory Visit | Attending: Gastroenterology | Admitting: Gastroenterology

## 2020-04-14 ENCOUNTER — Other Ambulatory Visit: Payer: Self-pay

## 2020-04-14 DIAGNOSIS — K5090 Crohn's disease, unspecified, without complications: Secondary | ICD-10-CM | POA: Diagnosis not present

## 2020-04-14 MED ORDER — SODIUM CHLORIDE 0.9 % IV SOLN
200.0000 mg | INTRAVENOUS | Status: DC
  Administered 2020-04-14: 200 mg via INTRAVENOUS
  Filled 2020-04-14: qty 20

## 2020-04-14 MED ORDER — PREDNISONE 20 MG PO TABS: 20.0000 mg | ORAL_TABLET | Freq: Every day | ORAL | Status: DC

## 2020-04-14 MED ORDER — DIPHENHYDRAMINE HCL 25 MG PO CAPS: 50.0000 mg | ORAL_CAPSULE | Freq: Once | ORAL | Status: DC

## 2020-04-14 MED ORDER — SODIUM CHLORIDE 0.9 % IV SOLN: Freq: Once | INTRAVENOUS | Status: DC

## 2020-04-14 MED ORDER — ACETAMINOPHEN 325 MG PO TABS: 650.0000 mg | ORAL_TABLET | Freq: Once | ORAL | Status: DC

## 2020-04-26 ENCOUNTER — Telehealth: Payer: Self-pay | Admitting: *Deleted

## 2020-04-26 NOTE — Telephone Encounter (Signed)
Pt called regarding her  COVID vaccine that was scheduled at [facility]; she was previously informed this site was closed and she rescheduled for [facility] on 05/01/20; she says this was cancelled per the pharmacist because she has a history of anaphylaxis with shots and the pharmacist was not comfortable administering it to her; pt informed she was rescheduled for her vaccine a [facility] 05/03/20 at 0930; emphasized to pt due to her history, she should discuss receiving this vaccine wWith her PCP prior to 05/03/20; she verbalized understanding and will call his office.

## 2020-05-01 ENCOUNTER — Ambulatory Visit: Payer: Medicare Other | Admitting: Allergy

## 2020-05-01 ENCOUNTER — Ambulatory Visit: Payer: Medicare Other

## 2020-05-01 ENCOUNTER — Other Ambulatory Visit: Payer: Self-pay

## 2020-05-01 ENCOUNTER — Ambulatory Visit (INDEPENDENT_AMBULATORY_CARE_PROVIDER_SITE_OTHER): Payer: Medicare Other

## 2020-05-01 ENCOUNTER — Encounter: Payer: Self-pay | Admitting: Allergy

## 2020-05-01 VITALS — BP 116/60 | HR 72 | Temp 98.6°F | Resp 16 | Ht 60.0 in | Wt 109.2 lb

## 2020-05-01 DIAGNOSIS — Z23 Encounter for immunization: Secondary | ICD-10-CM | POA: Diagnosis not present

## 2020-05-01 DIAGNOSIS — T50905D Adverse effect of unspecified drugs, medicaments and biological substances, subsequent encounter: Secondary | ICD-10-CM | POA: Diagnosis not present

## 2020-05-01 DIAGNOSIS — L500 Allergic urticaria: Secondary | ICD-10-CM | POA: Diagnosis not present

## 2020-05-01 DIAGNOSIS — T50905A Adverse effect of unspecified drugs, medicaments and biological substances, initial encounter: Secondary | ICD-10-CM | POA: Insufficient documentation

## 2020-05-01 NOTE — Progress Notes (Signed)
New Patient Note  RE: Cassandra Herring MRN: [id number] DOB: [birth year] Date of Office Visit: 05/01/2020  Consult requested by: pharmacy Primary care provider: Vernie Shanks, MD  Chief Complaint: drug allergies  History of Present Illness: I had the pleasure of seeing Cassandra Herring for initial evaluation at the [facility] on 05/01/2020. She is a 75 y.o. female, who is referred here by the pharmacy for the evaluation of adverse drug reactions and COVID-19 vaccine administration.  Patient has not received any prior COVID-19 vaccines. No prior COVID-19 diagnosis.   Drugs: Patient broke out on hives on her torso and chest tightness 1 hour after receiving Remicade for Crohn's. This did not occur on during her first infusion. She is not sure how long she was on it before she had her first reaction. Patient was treated at the clinic and the symptoms resolved while she was in the clinic. She is not sure of exact medications given to treat the symptoms.   Patient was switched to Humira injections and had localized swelling/erythema with that.   Currently patient is back on Remicade. She is getting half the usual dose and premedicates with prednisone and Benadryl. Managed by GI.   No infusions the last 2 week and she usually gets an infusion every 8 weeks now.  Patient had dizziness and rapid heart rate with IV benadryl but tolerates oral benadryl with no issues.   Methylprednisolone and solumedrol caused facial rash in the past.   Vaccines:  Any known reactions to polyethylene glycol or polysorbate or paclitaxel?  No.  Any history of anaphylaxis to vaccinations? Patient does not get flu vaccine. No reactions to other vaccines in the past.   Any history of reactions to injectable medications? Yes to benadryl - as above and IV steroids as above.   Any history of anaphylaxis to colonoscopy preps (i.e.Miralax)? No.  Any history of dermal filler treatments in  the last year? No.  Assessment and Plan: [name] is a 75 y.o. female with: Drug reaction History of reactions to Remicade, Humira, IV benadryl, solumedrol and methylprednisolone. Concerned about reactions to the COVID-19 vaccine. No prior COVID-19 infections. Patient currently on Remicade for Crohn's.   Discussed with patient at length that none of the above medication reactions are contraindications for getting the MRNA based COVID-19 vaccines and there is no indication for any skin testing for the vaccine either.  Patient is in agreement in getting a 2 dose - graded vaccine of the [place] COVID-19 vaccine today.  Consent was signed after risks and benefits were discussed.   Patient received 0.50m of the Pfizer COVID-19 vaccine with 30 minute wait then received the rest (0.231m of the Pfizer COVID-19 vaccine with 30 minute wait.  Patient requested injection in her left thigh as she has issues with tendons in her arms and does not want to be in pain.   Patient tolerates the vaccine with no reactions.   Patient's physical exam and vitals were stable upon discharge.  Handout given regarding what to expect after the vaccine.   Follow up for second PfTurkeynjection in 3 weeks under nurse visit. Will give second dose in one injection with 30 minute wait.   Return in about 3 weeks (around 05/22/2020).  No orders of the defined types were placed in this encounter.  Lab Orders  No laboratory test(s) ordered today    Other allergy screening: Asthma: no Rhino conjunctivitis: no Food allergy: no Hymenoptera allergy:  no Urticaria: no Eczema:yes History of recurrent infections suggestive of immunodeficency: no  Diagnostics: None.   Past Medical History: Patient Active Problem List   Diagnosis Date Noted  . Drug reaction 05/01/2020  . Crohn's disease with complication ([place]) 03/54/6568  . Female climacteric state 11/25/2017  . Osteopenia of left hip 11/25/2017  . Rosacea 11/24/2017   . Thyroid nodule 06/27/2015  . Mass 09/27/2011   Past Medical History:  Diagnosis Date  . Anemia   . Basal cell carcinoma   . Bursitis    left shoulder   . Crohn disease ([place])   . HNP (herniated nucleus pulposus)   . Mass    gluteal  . Osteopenia   . Partial tear of left subscapularis tendon   . STD (sexually transmitted disease)    Hx of genital warts  . Tendon tear    shoulder  . Thyroid nodule   . Vertigo   . Vitamin D deficiency    Past Surgical History: Past Surgical History:  Procedure Laterality Date  . BREAST SURGERY     implants-1981, removal-2003   Medication List:  Current Outpatient Medications  Medication Sig Dispense Refill  . acetaminophen (TYLENOL) 500 MG tablet Take 500 mg by mouth every 6 (six) hours as needed. 1 tab before remicaide treatment    . alclomethasone (ACLOVATE) 0.05 % cream Apply 1 application topically 2 (two) times daily.    Marland Kitchen amoxicillin (AMOXIL) 500 MG capsule Take 1 capsule (500 mg total) by mouth 3 (three) times daily. 15 capsule 0  . Ascorbic Acid (VITAMIN C) 1000 MG tablet Take 1,000 mg by mouth daily.    . Black Cohosh (REMIFEMIN) 20 MG TABS One tab po qd 60 tablet 0  . BORON PO Take by mouth.    Marland Kitchen CALCIUM PO Take by mouth daily.    . Cholecalciferol (VITAMIN D) 2000 UNITS tablet Take 2,000 Units by mouth 2 (two) times daily.     . COPPER PO Take by mouth.    . diphenhydrAMINE (BENADRYL) 50 MG capsule Take 50 mg by mouth every 6 (six) hours as needed. 1 tab before Remicade treatment    . estradiol (ESTRACE) 0.1 MG/GM vaginal cream Place 1 Applicatorful vaginally 2 (two) times a week. 42.5 g 4  . fexofenadine (ALLEGRA) 180 MG tablet Take 180 mg by mouth daily.    Marland Kitchen inFLIXimab (REMICADE) 100 MG injection Inject 200 mg into the vein. Every 8-10 weeks    . LORazepam (ATIVAN) 1 MG tablet Take 1 mg by mouth daily as needed.    Marland Kitchen Lysine 1000 MG TABS Take by mouth.    Marland Kitchen MAGNESIUM PO Take by mouth.    . Manganese 10 MG TABS Take by  mouth.    Marland Kitchen NON FORMULARY Marine collagen    . Omega-3 Fatty Acids (FISH OIL PO) Take 2 capsules by mouth daily.    . predniSONE (DELTASONE) 20 MG tablet Take 20 mg by mouth daily with breakfast. 1 tablet before and after treatment    . progesterone (PROMETRIUM) 100 MG capsule Take 1 capsule (100 mg total) by mouth at bedtime. 90 capsule 4  . tacrolimus (PROTOPIC) 0.1 % ointment Apply topically 2 (two) times daily.    Marland Kitchen triamcinolone cream (KENALOG) 0.1 % Apply 1 application topically 2 (two) times daily as needed.    Marland Kitchen VITAMIN K PO Take by mouth.    . Zinc Sulfate (ZINC 15 PO) Take by mouth.    . zolpidem (AMBIEN) 10 MG  tablet Take 5 mg by mouth at bedtime as needed for sleep.    . citalopram (CELEXA) 10 MG tablet Take 10 mg by mouth daily.     No current facility-administered medications for this visit.   Allergies: Allergies  Allergen Reactions  . Humira [Adalimumab] Rash    Raised persistent rash around the injection site.  . Methylprednisolone Rash  . Remicade [Infliximab] Hives, Rash and Other (See Comments)    Tightening of chest. Per patient pre treatment requires steroids, benadryl and a low dose of remicade.   Social History: Social History   Socioeconomic History  . Marital status: Widowed    Spouse name: Not on file  . Number of children: Not on file  . Years of education: Not on file  . Highest education level: Not on file  Occupational History  . Not on file  Tobacco Use  . Smoking status: Never Smoker  . Smokeless tobacco: Never Used  Vaping Use  . Vaping Use: Never used  Substance and Sexual Activity  . Alcohol use: No    Alcohol/week: 0.0 standard drinks  . Drug use: No  . Sexual activity: Not Currently    Partners: Male    Birth control/protection: Post-menopausal    Comment: 1st intercourse- 23, partners- 83, married- 5 yrs   Other Topics Concern  . Not on file  Social History Narrative  . Not on file   Social Determinants of Health   Financial  Resource Strain: Not on file  Food Insecurity: Not on file  Transportation Needs: Not on file  Physical Activity: Not on file  Stress: Not on file  Social Connections: Not on file   Lives in a 73 year old home. Smoking: denies Occupation: not employed  Environmental HistoryFreight forwarder in the house: no Charity fundraiser in the family room: yes Carpet in the bedroom: yes Heating: electric Cooling: central Pet: no  Family History: Family History  Problem Relation Age of Onset  . Lymphoma Father        non-hodkins  . Heart attack Father   . Cancer Father        skin  . Kidney disease Mother   . Heart attack Mother   . Emphysema Mother   . Breast cancer Maternal Aunt    Problem                               Relation Asthma                                   No  Eczema                                No  Food allergy                          No  Allergic rhino conjunctivitis     No   Review of Systems  Constitutional: Negative for appetite change, chills, fever and unexpected weight change.  HENT: Negative for congestion and rhinorrhea.   Eyes: Negative for itching.  Respiratory: Negative for cough, chest tightness, shortness of breath and wheezing.   Cardiovascular: Negative for chest pain.  Gastrointestinal: Negative for abdominal pain.  Genitourinary: Negative for difficulty urinating.  Skin: Negative for rash.  Neurological: Negative for headaches.   Objective: BP 116/60 (BP Location: Left Arm, Patient Position: Sitting, Cuff Size: Normal) Comment: Post Vaccine  Pulse 72   Temp 98.6 F (37 C) (Temporal)   Resp 16   Ht 5' (1.524 m)   Wt 109 lb 3.2 oz (49.5 kg)   LMP 01/29/1999   SpO2 97%   BMI 21.33 kg/m  Body mass index is 21.33 kg/m. Physical Exam Vitals and nursing note reviewed.  Constitutional:      Appearance: Normal appearance. She is well-developed.  HENT:     Head: Normocephalic and atraumatic.     Right Ear: External ear normal.     Left Ear:  External ear normal.     Nose: Nose normal.     Mouth/Throat:     Mouth: Mucous membranes are moist.     Pharynx: Oropharynx is clear.  Eyes:     Conjunctiva/sclera: Conjunctivae normal.  Cardiovascular:     Rate and Rhythm: Normal rate and regular rhythm.     Heart sounds: Normal heart sounds. No murmur heard. No friction rub. No gallop.   Pulmonary:     Effort: Pulmonary effort is normal.     Breath sounds: Normal breath sounds. No wheezing, rhonchi or rales.  Abdominal:     Palpations: Abdomen is soft.  Musculoskeletal:     Cervical back: Neck supple.  Skin:    General: Skin is warm.     Findings: No rash.  Neurological:     Mental Status: She is alert and oriented to person, place, and time.  Psychiatric:        Behavior: Behavior normal.    The plan was reviewed with the patient/family, and all questions/concerned were addressed.  It was my pleasure to see Gwendloyn today and participate in her care. Please feel free to contact me with any questions or concerns.  Sincerely,  Rexene Alberts, DO Allergy & Immunology  [facility] office: [place] office: [phone]

## 2020-05-01 NOTE — Patient Instructions (Addendum)
You received your first [place] COVID-19 vaccine today in 2 graded doses.  See handout for expected side effects. You can take Tylenol or Advil as needed for the pain.   For mild symptoms you can take over the counter antihistamines such as Benadryl and monitor symptoms closely. If symptoms worsen or if you have severe symptoms including breathing issues, throat closure, significant swelling, whole body hives, severe diarrhea and vomiting, lightheadedness then seek immediate medical care.  Follow up for second [place] injection in 3 weeks - only needs nurse visit.

## 2020-05-01 NOTE — Progress Notes (Signed)
   Covid-19 Vaccination Clinic  Name:  Cassandra Herring    MRN: [id number] DOB: [birth year]  05/01/2020  [name] was observed post Covid-19 immunization for 30 minutes based on pre-vaccination screening without incident. She was provided with Vaccine Information Sheet and instruction to access the V-Safe system.   [name] was instructed to call 911 with any severe reactions post vaccine: Marland Kitchen Difficulty breathing  . Swelling of face and throat  . A fast heartbeat  . A bad rash all over body  . Dizziness and weakness

## 2020-05-01 NOTE — Assessment & Plan Note (Addendum)
History of reactions to Remicade, Humira, IV benadryl, solumedrol and methylprednisolone. Concerned about reactions to the COVID-19 vaccine. No prior COVID-19 infections. Patient currently on Remicade for Crohn's.   Discussed with patient at length that none of the above medication reactions are contraindications for getting the MRNA based COVID-19 vaccines and there is no indication for any skin testing for the vaccine either.  Patient is in agreement in getting a 2 dose - graded vaccine of the [place] COVID-19 vaccine today.  Consent was signed after risks and benefits were discussed.   Patient received 0.55m of the Pfizer COVID-19 vaccine with 30 minute wait then received the rest (0.282m of the Pfizer COVID-19 vaccine with 30 minute wait.  Patient requested injection in her left thigh as she has issues with tendons in her arms and does not want to be in pain.   Patient tolerates the vaccine with no reactions.   Patient's physical exam and vitals were stable upon discharge.  Handout given regarding what to expect after the vaccine.   Follow up for second PfQuartzsitenjection in 3 weeks under nurse visit. Will give second dose in one injection with 30 minute wait.

## 2020-05-03 ENCOUNTER — Ambulatory Visit: Payer: Medicare Other

## 2020-05-11 DIAGNOSIS — H524 Presbyopia: Secondary | ICD-10-CM | POA: Diagnosis not present

## 2020-05-11 DIAGNOSIS — H5213 Myopia, bilateral: Secondary | ICD-10-CM | POA: Diagnosis not present

## 2020-05-11 DIAGNOSIS — H25013 Cortical age-related cataract, bilateral: Secondary | ICD-10-CM | POA: Diagnosis not present

## 2020-05-11 DIAGNOSIS — H52203 Unspecified astigmatism, bilateral: Secondary | ICD-10-CM | POA: Diagnosis not present

## 2020-05-11 DIAGNOSIS — H2513 Age-related nuclear cataract, bilateral: Secondary | ICD-10-CM | POA: Diagnosis not present

## 2020-05-18 DIAGNOSIS — Z8639 Personal history of other endocrine, nutritional and metabolic disease: Secondary | ICD-10-CM | POA: Diagnosis not present

## 2020-05-18 DIAGNOSIS — E559 Vitamin D deficiency, unspecified: Secondary | ICD-10-CM | POA: Diagnosis not present

## 2020-05-18 DIAGNOSIS — F4321 Adjustment disorder with depressed mood: Secondary | ICD-10-CM | POA: Diagnosis not present

## 2020-05-18 DIAGNOSIS — E7800 Pure hypercholesterolemia, unspecified: Secondary | ICD-10-CM | POA: Diagnosis not present

## 2020-05-18 DIAGNOSIS — F4323 Adjustment disorder with mixed anxiety and depressed mood: Secondary | ICD-10-CM | POA: Diagnosis not present

## 2020-05-18 DIAGNOSIS — K5090 Crohn's disease, unspecified, without complications: Secondary | ICD-10-CM | POA: Diagnosis not present

## 2020-05-18 DIAGNOSIS — F5101 Primary insomnia: Secondary | ICD-10-CM | POA: Diagnosis not present

## 2020-05-22 ENCOUNTER — Ambulatory Visit (INDEPENDENT_AMBULATORY_CARE_PROVIDER_SITE_OTHER): Payer: Medicare Other

## 2020-05-22 ENCOUNTER — Other Ambulatory Visit: Payer: Self-pay

## 2020-05-22 DIAGNOSIS — Z23 Encounter for immunization: Secondary | ICD-10-CM

## 2020-05-23 NOTE — Progress Notes (Signed)
   Covid-19 Vaccination Clinic  Name:  Cassandra Herring    MRN: [id number] DOB: 09-26-1945  05/23/2020  Ms. [name] was observed post Covid-19 immunization for 30 minutes based on pre-vaccination screening without incident. She was provided with Vaccine Information Sheet and instruction to access the V-Safe system.   Ms. [name] was instructed to call 911 with any severe reactions post vaccine: Marland Kitchen Difficulty breathing  . Swelling of face and throat  . A fast heartbeat  . A bad rash all over body  . Dizziness and weakness   Immunizations Administered    Name Date Dose VIS Date Route   PFIZER Comrnaty(Gray TOP) Covid-19 Vaccine 05/22/2020  1:43 PM 0.3 mL 01/06/2020 Intramuscular   Manufacturer: [place]   Lot: [id number]   [place]: [phone]

## 2020-06-12 DIAGNOSIS — R059 Cough, unspecified: Secondary | ICD-10-CM | POA: Diagnosis not present

## 2020-06-12 DIAGNOSIS — K5090 Crohn's disease, unspecified, without complications: Secondary | ICD-10-CM | POA: Diagnosis not present

## 2020-06-15 ENCOUNTER — Ambulatory Visit (HOSPITAL_COMMUNITY)
Admission: RE | Admit: 2020-06-15 | Discharge: 2020-06-15 | Disposition: A | Discharge status: Home / Self Care | Payer: Medicare Other | Source: Ambulatory Visit | Attending: Gastroenterology | Admitting: Gastroenterology

## 2020-06-15 ENCOUNTER — Other Ambulatory Visit: Payer: Self-pay

## 2020-06-15 DIAGNOSIS — K5090 Crohn's disease, unspecified, without complications: Secondary | ICD-10-CM | POA: Insufficient documentation

## 2020-06-15 MED ORDER — PREDNISONE 20 MG PO TABS: 20.0000 mg | ORAL_TABLET | Freq: Every day | ORAL | Status: DC

## 2020-06-15 MED ORDER — ACETAMINOPHEN 325 MG PO TABS: 650.0000 mg | ORAL_TABLET | Freq: Once | ORAL | Status: DC

## 2020-06-15 MED ORDER — SODIUM CHLORIDE 0.9 % IV SOLN
200.0000 mg | INTRAVENOUS | Status: DC
  Administered 2020-06-15: 200 mg via INTRAVENOUS
  Filled 2020-06-15: qty 20

## 2020-06-15 MED ORDER — SODIUM CHLORIDE 0.9 % IV SOLN: Freq: Once | INTRAVENOUS | Status: DC

## 2020-06-15 MED ORDER — DIPHENHYDRAMINE HCL 25 MG PO CAPS: 50.0000 mg | ORAL_CAPSULE | Freq: Once | ORAL | Status: DC

## 2020-06-22 MED ORDER — ESTRADIOL 0.1 MG/GM VA CREA: TOPICAL_CREAM | VAGINAL | 3 refills | Status: DC

## 2020-08-09 ENCOUNTER — Telehealth: Payer: Self-pay | Admitting: Family Medicine

## 2020-08-09 NOTE — Telephone Encounter (Signed)
   Jerianne Anselmo DOB: 03/31/1945 MRN: [id number]   RIDER WAIVER AND RELEASE OF LIABILITY  For purposes of improving physical access to our facilities, [facility] is pleased to partner with third parties to provide [place] patients or other authorized individuals the option of convenient, on-demand ground transportation services (the [place]") through use of the technology service that enables users to request on-demand ground transportation from independent third-party providers.  By opting to use and accept these [employer], I, the undersigned, hereby agree on behalf of myself, and on behalf of any minor child using the Government social research officer for whom I am the parent or legal guardian, as follows:  Government social research officer provided to me are provided by independent third-party transportation providers who are not [employer] or employees and who are unaffiliated with [employer]. [employer] is neither a transportation carrier nor a common or public carrier. [facility] has no control over the quality or safety of the transportation that occurs as a result of the [employer]. [facility] cannot guarantee that any third-party transportation provider will complete any arranged transportation service. [facility] makes no representation, warranty, or guarantee regarding the reliability, timeliness, quality, safety, suitability, or availability of any of the Transport Services or that they will be error free. I fully understand that traveling by vehicle involves risks and dangers of serious bodily injury, including permanent disability, paralysis, and death. I agree, on behalf of myself and on behalf of any minor child using the Transport Services for whom I am the parent or legal guardian, that the entire risk arising out of my use of the [employer] remains solely with me, to the maximum extent permitted under applicable law. The [employer] are provided  "as is" and "as available." [facility] disclaims all representations and warranties, express, implied or statutory, not expressly set out in these terms, including the implied warranties of merchantability and fitness for a particular purpose. I hereby waive and release [facility], its agents, employees, officers, directors, representatives, insurers, attorneys, assigns, successors, subsidiaries, and affiliates from any and all past, present, or future claims, demands, liabilities, actions, causes of action, or suits of any kind directly or indirectly arising from acceptance and use of the [employer]. I further waive and release [facility] and its affiliates from all present and future liability and responsibility for any injury or death to persons or damages to property caused by or related to the use of the [employer]. I have read this Waiver and Release of Liability, and I understand the terms used in it and their legal significance. This Waiver is freely and voluntarily given with the understanding that my right (as well as the right of any minor child for whom I am the parent or legal guardian using the [employer]) to legal recourse against [facility] in connection with the [employer] is knowingly surrendered in return for use of these services.   I attest that I read the consent document to [name], gave Ms. Curlene Dolphin the opportunity to ask questions and answered the questions asked (if any). I affirm that [name] then provided consent for she's participation in this program.     Drucie Ip

## 2020-08-10 DIAGNOSIS — L720 Epidermal cyst: Secondary | ICD-10-CM | POA: Diagnosis not present

## 2020-08-10 DIAGNOSIS — L810 Postinflammatory hyperpigmentation: Secondary | ICD-10-CM | POA: Diagnosis not present

## 2020-08-15 ENCOUNTER — Other Ambulatory Visit: Payer: Self-pay

## 2020-08-15 ENCOUNTER — Ambulatory Visit (HOSPITAL_COMMUNITY)
Admission: RE | Admit: 2020-08-15 | Discharge: 2020-08-15 | Disposition: A | Discharge status: Home / Self Care | Payer: Medicare Other | Source: Ambulatory Visit | Attending: Gastroenterology | Admitting: Gastroenterology

## 2020-08-15 DIAGNOSIS — K5090 Crohn's disease, unspecified, without complications: Secondary | ICD-10-CM | POA: Diagnosis not present

## 2020-08-15 MED ORDER — PREDNISONE 20 MG PO TABS: 20.0000 mg | ORAL_TABLET | Freq: Every day | ORAL | Status: DC

## 2020-08-15 MED ORDER — SODIUM CHLORIDE 0.9 % IV SOLN: Freq: Once | INTRAVENOUS | Status: AC

## 2020-08-15 MED ORDER — SODIUM CHLORIDE 0.9 % IV SOLN
200.0000 mg | INTRAVENOUS | Status: DC
  Administered 2020-08-15: 200 mg via INTRAVENOUS
  Filled 2020-08-15: qty 20

## 2020-08-15 MED ORDER — ACETAMINOPHEN 325 MG PO TABS: 800.0000 mg | ORAL_TABLET | Freq: Once | ORAL | Status: DC

## 2020-08-15 MED ORDER — DIPHENHYDRAMINE HCL 25 MG PO CAPS: 50.0000 mg | ORAL_CAPSULE | Freq: Once | ORAL | Status: DC

## 2020-08-25 DIAGNOSIS — S9031XA Contusion of right foot, initial encounter: Secondary | ICD-10-CM | POA: Diagnosis not present

## 2020-09-19 DIAGNOSIS — E871 Hypo-osmolality and hyponatremia: Secondary | ICD-10-CM | POA: Diagnosis not present

## 2020-09-19 DIAGNOSIS — F4323 Adjustment disorder with mixed anxiety and depressed mood: Secondary | ICD-10-CM | POA: Diagnosis not present

## 2020-09-19 DIAGNOSIS — K5090 Crohn's disease, unspecified, without complications: Secondary | ICD-10-CM | POA: Diagnosis not present

## 2020-09-19 DIAGNOSIS — F4321 Adjustment disorder with depressed mood: Secondary | ICD-10-CM | POA: Diagnosis not present

## 2020-09-19 DIAGNOSIS — E559 Vitamin D deficiency, unspecified: Secondary | ICD-10-CM | POA: Diagnosis not present

## 2020-09-19 DIAGNOSIS — E7800 Pure hypercholesterolemia, unspecified: Secondary | ICD-10-CM | POA: Diagnosis not present

## 2020-09-19 DIAGNOSIS — Z8639 Personal history of other endocrine, nutritional and metabolic disease: Secondary | ICD-10-CM | POA: Diagnosis not present

## 2020-09-19 DIAGNOSIS — F5101 Primary insomnia: Secondary | ICD-10-CM | POA: Diagnosis not present

## 2020-09-20 DIAGNOSIS — Z808 Family history of malignant neoplasm of other organs or systems: Secondary | ICD-10-CM | POA: Diagnosis not present

## 2020-09-20 DIAGNOSIS — Z85828 Personal history of other malignant neoplasm of skin: Secondary | ICD-10-CM | POA: Diagnosis not present

## 2020-09-20 DIAGNOSIS — D225 Melanocytic nevi of trunk: Secondary | ICD-10-CM | POA: Diagnosis not present

## 2020-09-20 DIAGNOSIS — C44311 Basal cell carcinoma of skin of nose: Secondary | ICD-10-CM | POA: Diagnosis not present

## 2020-09-20 DIAGNOSIS — L2084 Intrinsic (allergic) eczema: Secondary | ICD-10-CM | POA: Diagnosis not present

## 2020-09-20 DIAGNOSIS — L578 Other skin changes due to chronic exposure to nonionizing radiation: Secondary | ICD-10-CM | POA: Diagnosis not present

## 2020-09-20 DIAGNOSIS — D485 Neoplasm of uncertain behavior of skin: Secondary | ICD-10-CM | POA: Diagnosis not present

## 2020-09-20 DIAGNOSIS — L821 Other seborrheic keratosis: Secondary | ICD-10-CM | POA: Diagnosis not present

## 2020-10-10 ENCOUNTER — Other Ambulatory Visit (HOSPITAL_COMMUNITY): Payer: Self-pay | Admitting: *Deleted

## 2020-10-11 ENCOUNTER — Ambulatory Visit (HOSPITAL_COMMUNITY)
Admission: RE | Admit: 2020-10-11 | Discharge: 2020-10-11 | Disposition: A | Discharge status: Home / Self Care | Payer: Medicare Other | Source: Ambulatory Visit | Attending: Gastroenterology | Admitting: Gastroenterology

## 2020-10-11 ENCOUNTER — Other Ambulatory Visit: Payer: Self-pay

## 2020-10-11 DIAGNOSIS — K5090 Crohn's disease, unspecified, without complications: Secondary | ICD-10-CM | POA: Diagnosis not present

## 2020-10-11 MED ORDER — ACETAMINOPHEN 500 MG PO TABS: 500.0000 mg | ORAL_TABLET | ORAL | Status: DC

## 2020-10-11 MED ORDER — PREDNISONE 20 MG PO TABS: 20.0000 mg | ORAL_TABLET | ORAL | Status: DC

## 2020-10-11 MED ORDER — SODIUM CHLORIDE 0.9 % IV SOLN: INTRAVENOUS | Status: DC

## 2020-10-11 MED ORDER — SODIUM CHLORIDE 0.9 % IV SOLN
200.0000 mg | INTRAVENOUS | Status: DC
  Administered 2020-10-11: 200 mg via INTRAVENOUS
  Filled 2020-10-11: qty 20

## 2020-10-11 MED ORDER — DIPHENHYDRAMINE HCL 25 MG PO CAPS: 50.0000 mg | ORAL_CAPSULE | ORAL | Status: DC

## 2020-10-26 DIAGNOSIS — C44311 Basal cell carcinoma of skin of nose: Secondary | ICD-10-CM | POA: Diagnosis not present

## 2020-10-27 HISTORY — PX: MOHS SURGERY: SUR867

## 2020-11-28 DIAGNOSIS — S0120XA Unspecified open wound of nose, initial encounter: Secondary | ICD-10-CM | POA: Diagnosis not present

## 2020-12-08 ENCOUNTER — Ambulatory Visit (HOSPITAL_COMMUNITY)
Admission: RE | Admit: 2020-12-08 | Discharge: 2020-12-08 | Disposition: A | Discharge status: Home / Self Care | Payer: Medicare Other | Source: Ambulatory Visit | Attending: Gastroenterology | Admitting: Gastroenterology

## 2020-12-08 DIAGNOSIS — K5090 Crohn's disease, unspecified, without complications: Secondary | ICD-10-CM | POA: Insufficient documentation

## 2020-12-08 MED ORDER — DIPHENHYDRAMINE HCL 25 MG PO CAPS: 50.0000 mg | ORAL_CAPSULE | ORAL | Status: DC

## 2020-12-08 MED ORDER — PREDNISONE 20 MG PO TABS: 20.0000 mg | ORAL_TABLET | ORAL | Status: DC

## 2020-12-08 MED ORDER — SODIUM CHLORIDE 0.9 % IV SOLN: INTRAVENOUS | Status: DC

## 2020-12-08 MED ORDER — ACETAMINOPHEN 500 MG PO TABS: 500.0000 mg | ORAL_TABLET | ORAL | Status: DC

## 2020-12-08 MED ORDER — SODIUM CHLORIDE 0.9 % IV SOLN
200.0000 mg | INTRAVENOUS | Status: DC
  Administered 2020-12-08: 200 mg via INTRAVENOUS
  Filled 2020-12-08: qty 20

## 2020-12-08 NOTE — Progress Notes (Signed)
VAST consult received to obtain IV access. Pt appears to have good veins, but she reports issues with IV's not threading or infiltrating after 12 years of Remicade infusions. VAST RN utilized ultrasound to place PIV for infusion. No difficulty noted. Pt very appreciative.

## 2020-12-12 ENCOUNTER — Other Ambulatory Visit: Payer: Self-pay | Admitting: Obstetrics & Gynecology

## 2020-12-13 MED ORDER — PROGESTERONE MICRONIZED 100 MG PO CAPS: 100.0000 mg | ORAL_CAPSULE | Freq: Every day | ORAL | 0 refills | Status: DC

## 2020-12-13 NOTE — Telephone Encounter (Signed)
Annual exam scheduled on 03/13/20. Rx sent.

## 2021-01-08 ENCOUNTER — Other Ambulatory Visit: Payer: Self-pay

## 2021-01-08 ENCOUNTER — Ambulatory Visit (INDEPENDENT_AMBULATORY_CARE_PROVIDER_SITE_OTHER): Payer: Medicare Other

## 2021-01-08 DIAGNOSIS — Z23 Encounter for immunization: Secondary | ICD-10-CM | POA: Diagnosis not present

## 2021-01-14 ENCOUNTER — Encounter: Payer: Self-pay | Admitting: Obstetrics & Gynecology

## 2021-01-15 ENCOUNTER — Ambulatory Visit (INDEPENDENT_AMBULATORY_CARE_PROVIDER_SITE_OTHER): Payer: Medicare Other | Admitting: Obstetrics & Gynecology

## 2021-01-15 ENCOUNTER — Other Ambulatory Visit: Payer: Self-pay

## 2021-01-15 ENCOUNTER — Encounter: Payer: Self-pay | Admitting: Obstetrics & Gynecology

## 2021-01-15 VITALS — BP 118/70 | HR 81

## 2021-01-15 DIAGNOSIS — R102 Pelvic and perineal pain: Secondary | ICD-10-CM

## 2021-01-15 DIAGNOSIS — R3915 Urgency of urination: Secondary | ICD-10-CM

## 2021-01-15 DIAGNOSIS — Z113 Encounter for screening for infections with a predominantly sexual mode of transmission: Secondary | ICD-10-CM

## 2021-01-15 LAB — URINALYSIS, COMPLETE W/RFL CULTURE
Bacteria, UA: NONE SEEN /HPF
Bilirubin Urine: NEGATIVE
Glucose, UA: NEGATIVE
Hyaline Cast: NONE SEEN /LPF
Ketones, ur: NEGATIVE
Leukocyte Esterase: NEGATIVE
Nitrites, Initial: NEGATIVE
Protein, ur: NEGATIVE
RBC / HPF: NONE SEEN /HPF (ref 0–2)
Specific Gravity, Urine: 1.01 (ref 1.001–1.035)
WBC, UA: NONE SEEN /HPF (ref 0–5)
pH: 5.5 (ref 5.0–8.0)

## 2021-01-15 LAB — NO CULTURE INDICATED

## 2021-01-15 LAB — WET PREP FOR TRICH, YEAST, CLUE

## 2021-01-15 MED ORDER — FLUCONAZOLE 150 MG PO TABS: 150.0000 mg | ORAL_TABLET | Freq: Every day | ORAL | 1 refills | Status: AC

## 2021-01-15 NOTE — Progress Notes (Signed)
° ° °  Cassandra Herring 08-07-1945 [id number]        75 y.o.  G0P0000 Widowed  RP: Pelvic discomfort  HPI: Sexually active again with a new partner x 2 months.  Used Olive oil for lubrication.  Feels a vague pelvic discomfort.  No PMB.  No vaginal discharge. Sensation of incomplete emptying of bladder, with need to finish urination shortly after.  No UTI Sx otherwise.  No fever.   OB History  Gravida Para Term Preterm AB Living  0 0 0 0 0 0  SAB IAB Ectopic Multiple Live Births  0 0 0 0      Past medical history,surgical history, problem list, medications, allergies, family history and social history were all reviewed and documented in the EPIC chart.   Directed ROS with pertinent positives and negatives documented in the history of present illness/assessment and plan.  Exam:  Vitals:   01/15/21 1154  BP: 118/70  Pulse: 81  SpO2: 97%   General appearance:  Normal  Abdomen: Normal  Gynecologic exam: Vulva normal.  Bimanual exam: Uterus  AV, normal size, shape and consistency, non-tender and mobile.  Adnexa  Without masses or tenderness.  No uterine prolapse or Cysto-rectocele.             U/A Negative Wet prep Yeasts present   Assessment/Plan:  75 y.o. G0P0000   1. Urgency of urination Sexually active again with a new partner x 2 months.  Used Olive oil for lubrication.  Feels a vague pelvic discomfort.  No PMB.  No vaginal discharge. Sensation of incomplete emptying of bladder, with need to finish urination shortly after.  No UTI Sx otherwise.  No fever. U/A Negative.  Patient reassured.  Counseling on how to empty bladder more completely when urinating.  Recommend regular, more frequent urinations to avoid overfilling of the bladder.  Kegel exercises. - Urinalysis,Complete w/RFL Culture  2. Pelvic pain in female Wet prep Neg.  Normal Gyn exam. - C. trachomatis/N. gonorrhoeae RNA - WET PREP FOR [place], YEAST, CLUE  3. Screen for STD (sexually transmitted  disease) - C. trachomatis/N. gonorrhoeae RNA - WET PREP FOR [place], YEAST, CLUE  Other orders - VITAMIN D PO; Take 2,200 Int'l Units by mouth. - Evening Primrose Oil 500 MG CAPS; Take by mouth. - Cetirizine HCl (ZYRTEC ALLERGY PO); Take by mouth.  - fluconazole (DIFLUCAN) 150 MG tablet; Take 1 tablet (150 mg total) by mouth daily for 3 days.   Princess Bruins MD, 12:15 PM 01/15/2021

## 2021-01-16 LAB — C. TRACHOMATIS/N. GONORRHOEAE RNA
C. trachomatis RNA, TMA: NOT DETECTED
N. gonorrhoeae RNA, TMA: NOT DETECTED

## 2021-01-18 ENCOUNTER — Encounter: Payer: Self-pay | Admitting: Obstetrics & Gynecology

## 2021-02-06 ENCOUNTER — Ambulatory Visit (HOSPITAL_COMMUNITY)
Admission: RE | Admit: 2021-02-06 | Discharge: 2021-02-06 | Disposition: A | Discharge status: Home / Self Care | Payer: Medicare Other | Source: Ambulatory Visit | Attending: Gastroenterology | Admitting: Gastroenterology

## 2021-02-06 ENCOUNTER — Other Ambulatory Visit: Payer: Self-pay

## 2021-02-06 DIAGNOSIS — K5090 Crohn's disease, unspecified, without complications: Secondary | ICD-10-CM | POA: Diagnosis not present

## 2021-02-06 MED ORDER — SODIUM CHLORIDE 0.9 % IV SOLN
200.0000 mg | INTRAVENOUS | Status: DC
  Administered 2021-02-06: 200 mg via INTRAVENOUS
  Filled 2021-02-06: qty 20

## 2021-02-06 MED ORDER — SODIUM CHLORIDE 0.9 % IV SOLN: INTRAVENOUS | Status: DC

## 2021-02-06 MED ORDER — DIPHENHYDRAMINE HCL 25 MG PO CAPS: 50.0000 mg | ORAL_CAPSULE | ORAL | Status: DC

## 2021-02-06 MED ORDER — ACETAMINOPHEN 500 MG PO TABS: 500.0000 mg | ORAL_TABLET | ORAL | Status: DC

## 2021-02-06 MED ORDER — PREDNISONE 20 MG PO TABS: 20.0000 mg | ORAL_TABLET | ORAL | Status: DC

## 2021-02-07 ENCOUNTER — Encounter: Payer: Self-pay | Admitting: Obstetrics & Gynecology

## 2021-02-08 ENCOUNTER — Other Ambulatory Visit: Payer: Self-pay

## 2021-02-08 ENCOUNTER — Ambulatory Visit (INDEPENDENT_AMBULATORY_CARE_PROVIDER_SITE_OTHER): Payer: Medicare Other | Admitting: Nurse Practitioner

## 2021-02-08 VITALS — BP 118/60

## 2021-02-08 DIAGNOSIS — A609 Anogenital herpesviral infection, unspecified: Secondary | ICD-10-CM | POA: Diagnosis not present

## 2021-02-08 MED ORDER — VALACYCLOVIR HCL 1 G PO TABS: 1000.0000 mg | ORAL_TABLET | Freq: Two times a day (BID) | ORAL | 3 refills | Status: DC

## 2021-02-08 NOTE — Progress Notes (Signed)
° °  Acute Office Visit  Subjective:    Patient ID: Shameca Landen, female    DOB: Dec 03, 1945, 76 y.o.   MRN: [id number]   HPI 76 y.o. presents today for lesion on perineum that appeared yesterday. Symptoms have improved some today as she feels it is smaller and less painful. History of HSV, last outbreak 01/2019, positive culture obtained at that time. Did not have Valtrex at home, but it was prescribed by Dr. Dellis Filbert earlier today after patient reached out to our office. She wanted to be seen to make sure it was in fact HSV before starting. Sexually active, partner has HSV.    Review of Systems  Constitutional: Negative.   Genitourinary:  Positive for genital sores.      Objective:    Physical Exam Constitutional:      Appearance: Normal appearance.  Genitourinary:     BP 118/60    LMP 01/29/1999  Wt Readings from Last 3 Encounters:  02/06/21 108 lb (49 kg)  12/08/20 107 lb (48.5 kg)  10/11/20 106 lb (48.1 kg)        Assessment & Plan:   Problem List Items Addressed This Visit   None Visit Diagnoses     HSV (herpes simplex virus) anogenital infection    -  Primary      Plan: Reassurance provided that lesion appears to be HSV and recommended to take Valacyclovir twice daily for 3-5 days. She was provided with refills for future outbreaks with instructions to take as soon as outbreak occurs. We discussed transmission and abstaining from intercourse when active lesions or prodromal symptoms are present. All questions answered.     Tamela Gammon DNP, 2:41 PM 02/08/2021

## 2021-03-12 ENCOUNTER — Other Ambulatory Visit: Payer: Self-pay | Admitting: Obstetrics & Gynecology

## 2021-03-13 ENCOUNTER — Ambulatory Visit (INDEPENDENT_AMBULATORY_CARE_PROVIDER_SITE_OTHER): Payer: Medicare Other | Admitting: Obstetrics & Gynecology

## 2021-03-13 ENCOUNTER — Encounter: Payer: Self-pay | Admitting: Obstetrics & Gynecology

## 2021-03-13 ENCOUNTER — Other Ambulatory Visit: Payer: Self-pay

## 2021-03-13 VITALS — BP 118/60 | HR 78 | Ht 60.25 in | Wt 110.0 lb

## 2021-03-13 DIAGNOSIS — Z9189 Other specified personal risk factors, not elsewhere classified: Secondary | ICD-10-CM

## 2021-03-13 DIAGNOSIS — N952 Postmenopausal atrophic vaginitis: Secondary | ICD-10-CM

## 2021-03-13 DIAGNOSIS — N3946 Mixed incontinence: Secondary | ICD-10-CM

## 2021-03-13 DIAGNOSIS — Z9289 Personal history of other medical treatment: Secondary | ICD-10-CM | POA: Diagnosis not present

## 2021-03-13 DIAGNOSIS — Z01419 Encounter for gynecological examination (general) (routine) without abnormal findings: Secondary | ICD-10-CM

## 2021-03-13 DIAGNOSIS — Z780 Asymptomatic menopausal state: Secondary | ICD-10-CM | POA: Diagnosis not present

## 2021-03-13 MED ORDER — PROGESTERONE MICRONIZED 100 MG PO CAPS: 100.0000 mg | ORAL_CAPSULE | Freq: Every day | ORAL | 4 refills | Status: DC

## 2021-03-13 MED ORDER — ESTRADIOL 0.1 MG/GM VA CREA: 1.0000 | TOPICAL_CREAM | VAGINAL | 3 refills | Status: DC

## 2021-03-13 NOTE — Progress Notes (Signed)
Cassandra Herring 04-18-1945 [id number]   History:    76 y.o. G0  Married   RP: Established patient presenting for annual gyn exam    HPI: Menopause on HRT x 20 yrs, now only taking Prometrium 100 mg HS and Estradiol cream vaginally twice a week.  No PMB. No pelvic pain.  Sexually active.  H/O Silicone breast implants which ruptured and were removed.  Last Mammo 2019, declines repeating a mammo because has too little breast tissue/scarring.  BMI 21.3 .  Walking regularly. Taking Vit D and Ca++. Osteopenia BD 09/2019 at [facility], will schedule a BD at 2 yrs.  C/O urinary urgency which mainly occurs when her bladder is full.  Mild stable SUI. Health labs with Fam MD.  Crohn's disease stable.  Past medical history,surgical history, family history and social history were all reviewed and documented in the EPIC chart.  Gynecologic History Patient's last menstrual period was 01/29/1999.  Obstetric History OB History  Gravida Para Term Preterm AB Living  0 0 0 0 0 0  SAB IAB Ectopic Multiple Live Births  0 0 0 0       ROS: A ROS was performed and pertinent positives and negatives are included in the history.  GENERAL: No fevers or chills. HEENT: No change in vision, no earache, sore throat or sinus congestion. NECK: No pain or stiffness. CARDIOVASCULAR: No chest pain or pressure. No palpitations. PULMONARY: No shortness of breath, cough or wheeze. GASTROINTESTINAL: No abdominal pain, nausea, vomiting or diarrhea, melena or bright red blood per rectum. GENITOURINARY: No urinary frequency, urgency, hesitancy or dysuria. MUSCULOSKELETAL: No joint or muscle pain, no back pain, no recent trauma. DERMATOLOGIC: No rash, no itching, no lesions. ENDOCRINE: No polyuria, polydipsia, no heat or cold intolerance. No recent change in weight. HEMATOLOGICAL: No anemia or easy bruising or bleeding. NEUROLOGIC: No headache, seizures, numbness, tingling or weakness. PSYCHIATRIC: No depression, no loss of interest  in normal activity or change in sleep pattern.     Exam:   BP 118/60    Pulse 78    Ht 5' 0.25" (1.53 m)    Wt 110 lb (49.9 kg)    LMP 01/29/1999    BMI 21.30 kg/m   Body mass index is 21.3 kg/m.  General appearance : Well developed well nourished female. No acute distress HEENT: Eyes: no retinal hemorrhage or exudates,  Neck supple, trachea midline, no carotid bruits, no thyroidmegaly Lungs: Clear to auscultation, no rhonchi or wheezes, or rib retractions  Heart: Regular rate and rhythm, no murmurs or gallops Breast:Examined in sitting and supine position were symmetrical in appearance, no palpable masses or tenderness,  no skin retraction, no nipple inversion, no nipple discharge, no skin discoloration, no axillary or supraclavicular lymphadenopathy Abdomen: no palpable masses or tenderness, no rebound or guarding Extremities: no edema or skin discoloration or tenderness  Pelvic: Vulva: Normal             Vagina: No gross lesions or discharge  Cervix: No gross lesions or discharge  Uterus  AV, normal size, shape and consistency, non-tender and mobile  Adnexa  Without masses or tenderness  Anus: Normal   Assessment/Plan:  76 y.o. female for annual exam   1. Well female exam with routine gynecological exam Menopause on HRT x 20 yrs, now only taking Prometrium 100 mg HS and Estradiol cream vaginally twice a week.  No PMB. No pelvic pain.  Sexually active.  H/O Silicone breast implants which ruptured and  were removed.  Last Mammo 2019, declines repeating a mammo because has too little breast tissue/scarring.  BMI 21.3 .  Walking regularly. Taking Vit D and Ca++. Osteopenia BD 09/2019 at [facility], will schedule a BD at 2 yrs.  C/O urinary urgency which mainly occurs when her bladder is full.  Mild stable SUI. Health labs with Fam MD.  Crohn's disease stable.  2. Postmenopause Menopause on HRT x 20 yrs, now only taking Prometrium 100 mg HS and Estradiol cream vaginally twice a week.  No  PMB. No pelvic pain.  Sexually active. Patient prefers to continue on Prometrium 100 mg HS, no CI, prescription sent to pharmacy.  3. Postmenopausal atrophic vaginitis Well on Estradiol cream twice a week.  Prescription sent to pharmacy.  4. Mixed stress and urge urinary incontinence Counseling on both stress and urge incontinence.  Will empty her bladder before it overfills as much as possible.  Avoid caffeinated beverages and foods.  The benefits of Kegel exercises reinforced.  Refer to Urology/PT in the future if symptoms worsen.  5. Other specified personal risk factors, not elsewhere classified  6. Personal history of other medical treatment  Other orders - LORazepam (ATIVAN) 0.5 MG tablet; Take 0.5 mg by mouth at bedtime. - MANGANESE PO; Take 3 mg by mouth. - Black Cohosh 21 MG TABS; Take by mouth. - estradiol (ESTRACE) 0.1 MG/GM vaginal cream; Place 1 Applicatorful vaginally 2 (two) times a week. - progesterone (PROMETRIUM) 100 MG capsule; Take 1 capsule (100 mg total) by mouth at bedtime.   Princess Bruins MD, 11:54 AM 03/13/2021

## 2021-03-13 NOTE — Telephone Encounter (Signed)
Last AEX 10/19/19. Scheduled today 03/13/21.  Last mammo-2019 Last DEXA 10/21/19

## 2021-03-14 ENCOUNTER — Other Ambulatory Visit: Payer: Self-pay | Admitting: Obstetrics & Gynecology

## 2021-03-14 ENCOUNTER — Encounter: Payer: Self-pay | Admitting: Obstetrics & Gynecology

## 2021-03-14 NOTE — Telephone Encounter (Signed)
I called patient because Dr. Marguerita Merles sent Rx yesterday to [facility] and today [facility] pharmacy requested.  Patient said she could get is much less expensive at the [facility]. [facility] will [facility] the [facility] at [facility]. Rx sent to [facility].

## 2021-03-20 DIAGNOSIS — Z1389 Encounter for screening for other disorder: Secondary | ICD-10-CM | POA: Diagnosis not present

## 2021-03-20 DIAGNOSIS — R739 Hyperglycemia, unspecified: Secondary | ICD-10-CM | POA: Diagnosis not present

## 2021-03-20 DIAGNOSIS — K5090 Crohn's disease, unspecified, without complications: Secondary | ICD-10-CM | POA: Diagnosis not present

## 2021-03-20 DIAGNOSIS — Z8639 Personal history of other endocrine, nutritional and metabolic disease: Secondary | ICD-10-CM | POA: Diagnosis not present

## 2021-03-20 DIAGNOSIS — E871 Hypo-osmolality and hyponatremia: Secondary | ICD-10-CM | POA: Diagnosis not present

## 2021-03-20 DIAGNOSIS — E559 Vitamin D deficiency, unspecified: Secondary | ICD-10-CM | POA: Diagnosis not present

## 2021-03-20 DIAGNOSIS — F5101 Primary insomnia: Secondary | ICD-10-CM | POA: Diagnosis not present

## 2021-03-20 DIAGNOSIS — F4323 Adjustment disorder with mixed anxiety and depressed mood: Secondary | ICD-10-CM | POA: Diagnosis not present

## 2021-03-20 DIAGNOSIS — E7800 Pure hypercholesterolemia, unspecified: Secondary | ICD-10-CM | POA: Diagnosis not present

## 2021-03-20 DIAGNOSIS — Z0000 Encounter for general adult medical examination without abnormal findings: Secondary | ICD-10-CM | POA: Diagnosis not present

## 2021-03-22 DIAGNOSIS — Z4801 Encounter for change or removal of surgical wound dressing: Secondary | ICD-10-CM | POA: Diagnosis not present

## 2021-03-22 DIAGNOSIS — D485 Neoplasm of uncertain behavior of skin: Secondary | ICD-10-CM | POA: Diagnosis not present

## 2021-03-22 DIAGNOSIS — Z48817 Encounter for surgical aftercare following surgery on the skin and subcutaneous tissue: Secondary | ICD-10-CM | POA: Diagnosis not present

## 2021-03-22 DIAGNOSIS — L723 Sebaceous cyst: Secondary | ICD-10-CM | POA: Diagnosis not present

## 2021-03-23 DIAGNOSIS — E7800 Pure hypercholesterolemia, unspecified: Secondary | ICD-10-CM | POA: Diagnosis not present

## 2021-03-23 DIAGNOSIS — R739 Hyperglycemia, unspecified: Secondary | ICD-10-CM | POA: Diagnosis not present

## 2021-03-23 DIAGNOSIS — E559 Vitamin D deficiency, unspecified: Secondary | ICD-10-CM | POA: Diagnosis not present

## 2021-03-29 DIAGNOSIS — L905 Scar conditions and fibrosis of skin: Secondary | ICD-10-CM | POA: Diagnosis not present

## 2021-04-04 ENCOUNTER — Encounter (HOSPITAL_COMMUNITY)
Admission: RE | Admit: 2021-04-04 | Discharge: 2021-04-04 | Disposition: A | Discharge status: Home / Self Care | Payer: Medicare Other | Source: Ambulatory Visit | Attending: Gastroenterology | Admitting: Gastroenterology

## 2021-04-04 DIAGNOSIS — K5090 Crohn's disease, unspecified, without complications: Secondary | ICD-10-CM | POA: Insufficient documentation

## 2021-04-04 MED ORDER — ACETAMINOPHEN 500 MG PO TABS: 500.0000 mg | ORAL_TABLET | ORAL | Status: DC

## 2021-04-04 MED ORDER — SODIUM CHLORIDE 0.9 % IV SOLN
200.0000 mg | INTRAVENOUS | Status: DC
  Administered 2021-04-04: 200 mg via INTRAVENOUS
  Filled 2021-04-04: qty 20

## 2021-04-04 MED ORDER — DIPHENHYDRAMINE HCL 25 MG PO CAPS: 50.0000 mg | ORAL_CAPSULE | ORAL | Status: DC

## 2021-04-04 MED ORDER — PREDNISONE 20 MG PO TABS
20.0000 mg | ORAL_TABLET | ORAL | Status: DC
Start: 2021-04-04 — End: 2021-04-05

## 2021-04-04 MED ORDER — SODIUM CHLORIDE 0.9 % IV SOLN: INTRAVENOUS | Status: DC

## 2021-04-26 DIAGNOSIS — K5010 Crohn's disease of large intestine without complications: Secondary | ICD-10-CM | POA: Diagnosis not present

## 2021-04-26 DIAGNOSIS — D122 Benign neoplasm of ascending colon: Secondary | ICD-10-CM | POA: Diagnosis not present

## 2021-04-26 DIAGNOSIS — K5730 Diverticulosis of large intestine without perforation or abscess without bleeding: Secondary | ICD-10-CM | POA: Diagnosis not present

## 2021-04-30 DIAGNOSIS — D122 Benign neoplasm of ascending colon: Secondary | ICD-10-CM | POA: Diagnosis not present

## 2021-05-14 DIAGNOSIS — H25013 Cortical age-related cataract, bilateral: Secondary | ICD-10-CM | POA: Diagnosis not present

## 2021-05-14 DIAGNOSIS — H2513 Age-related nuclear cataract, bilateral: Secondary | ICD-10-CM | POA: Diagnosis not present

## 2021-06-05 ENCOUNTER — Ambulatory Visit (HOSPITAL_COMMUNITY)
Admission: RE | Admit: 2021-06-05 | Discharge: 2021-06-05 | Disposition: A | Discharge status: Home / Self Care | Payer: Medicare Other | Source: Ambulatory Visit | Attending: Gastroenterology | Admitting: Gastroenterology

## 2021-06-05 DIAGNOSIS — K5090 Crohn's disease, unspecified, without complications: Secondary | ICD-10-CM | POA: Insufficient documentation

## 2021-06-05 MED ORDER — DIPHENHYDRAMINE HCL 25 MG PO CAPS: 50.0000 mg | ORAL_CAPSULE | ORAL | Status: DC

## 2021-06-05 MED ORDER — ACETAMINOPHEN 500 MG PO TABS: 500.0000 mg | ORAL_TABLET | ORAL | Status: DC

## 2021-06-05 MED ORDER — PREDNISONE 20 MG PO TABS
20.0000 mg | ORAL_TABLET | ORAL | Status: DC
Start: 2021-06-05 — End: 2021-06-06

## 2021-06-05 MED ORDER — SODIUM CHLORIDE 0.9 % IV SOLN: INTRAVENOUS | Status: DC

## 2021-06-05 MED ORDER — SODIUM CHLORIDE 0.9 % IV SOLN
200.0000 mg | INTRAVENOUS | Status: DC
  Administered 2021-06-05: 200 mg via INTRAVENOUS
  Filled 2021-06-05: qty 20

## 2021-07-12 DIAGNOSIS — D1801 Hemangioma of skin and subcutaneous tissue: Secondary | ICD-10-CM | POA: Diagnosis not present

## 2021-07-12 DIAGNOSIS — T8189XA Other complications of procedures, not elsewhere classified, initial encounter: Secondary | ICD-10-CM | POA: Diagnosis not present

## 2021-07-12 DIAGNOSIS — L738 Other specified follicular disorders: Secondary | ICD-10-CM | POA: Diagnosis not present

## 2021-07-12 DIAGNOSIS — L820 Inflamed seborrheic keratosis: Secondary | ICD-10-CM | POA: Diagnosis not present

## 2021-08-03 ENCOUNTER — Ambulatory Visit (HOSPITAL_COMMUNITY)
Admission: RE | Admit: 2021-08-03 | Discharge: 2021-08-03 | Disposition: A | Discharge status: Home / Self Care | Payer: Medicare Other | Source: Ambulatory Visit | Attending: Gastroenterology | Admitting: Gastroenterology

## 2021-08-03 DIAGNOSIS — K5090 Crohn's disease, unspecified, without complications: Secondary | ICD-10-CM | POA: Diagnosis not present

## 2021-08-03 MED ORDER — SODIUM CHLORIDE 0.9 % IV SOLN
INTRAVENOUS | Status: DC
  Administered 2021-08-03: 1000 mL via INTRAVENOUS

## 2021-08-03 MED ORDER — ACETAMINOPHEN 500 MG PO TABS: 500.0000 mg | ORAL_TABLET | ORAL | Status: DC

## 2021-08-03 MED ORDER — DIPHENHYDRAMINE HCL 25 MG PO CAPS: 50.0000 mg | ORAL_CAPSULE | ORAL | Status: DC

## 2021-08-03 MED ORDER — PREDNISONE 20 MG PO TABS: 20.0000 mg | ORAL_TABLET | ORAL | Status: DC

## 2021-08-03 MED ORDER — SODIUM CHLORIDE 0.9 % IV SOLN
200.0000 mg | INTRAVENOUS | Status: DC
  Administered 2021-08-03: 200 mg via INTRAVENOUS
  Filled 2021-08-03: qty 20

## 2021-08-07 ENCOUNTER — Telehealth: Payer: Self-pay | Admitting: Allergy

## 2021-08-07 NOTE — Telephone Encounter (Signed)
I contacted Cassandra Herring or the patient's caregiver to inform them that she received an expired dose of Pfizer COVID-19 vaccine from our office, during their visit(s), on 01/08/2021. The dose was expired by 13 days. This was the patient's 3rd dose. I shared the following information with the patient or caregiver: vaccines given after they have expired may be less effective but we are not aware of any other adverse effects. The patient can be re-vaccinated at no cost if the patient decides to do so. Answered patient questions/concerns. Encouraged patient to reach out if they have any additional questions or concerns.  The patient decided not to get any additional vaccines at this time.

## 2021-09-04 DIAGNOSIS — F5101 Primary insomnia: Secondary | ICD-10-CM | POA: Diagnosis not present

## 2021-09-19 DIAGNOSIS — U071 COVID-19: Secondary | ICD-10-CM | POA: Diagnosis not present

## 2021-09-28 ENCOUNTER — Encounter (HOSPITAL_COMMUNITY): Payer: Medicare Other

## 2021-10-04 ENCOUNTER — Other Ambulatory Visit (HOSPITAL_COMMUNITY): Payer: Self-pay | Admitting: *Deleted

## 2021-10-05 ENCOUNTER — Ambulatory Visit (HOSPITAL_COMMUNITY)
Admission: RE | Admit: 2021-10-05 | Discharge: 2021-10-05 | Disposition: A | Discharge status: Home / Self Care | Payer: Medicare Other | Source: Ambulatory Visit | Attending: Gastroenterology | Admitting: Gastroenterology

## 2021-10-05 DIAGNOSIS — K5090 Crohn's disease, unspecified, without complications: Secondary | ICD-10-CM | POA: Diagnosis not present

## 2021-10-05 MED ORDER — PREDNISONE 20 MG PO TABS: 20.0000 mg | ORAL_TABLET | Freq: Once | ORAL | Status: DC

## 2021-10-05 MED ORDER — SODIUM CHLORIDE 0.9 % IV SOLN
200.0000 mg | INTRAVENOUS | Status: DC
  Administered 2021-10-05: 200 mg via INTRAVENOUS
  Filled 2021-10-05: qty 20

## 2021-10-05 MED ORDER — ACETAMINOPHEN 500 MG PO TABS: 500.0000 mg | ORAL_TABLET | Freq: Once | ORAL | Status: DC

## 2021-10-05 MED ORDER — DIPHENHYDRAMINE HCL 25 MG PO CAPS
50.0000 mg | ORAL_CAPSULE | Freq: Once | ORAL | Status: DC
Start: 2021-10-05 — End: 2021-10-06

## 2021-10-05 MED ORDER — SODIUM CHLORIDE 0.9 % IV SOLN: INTRAVENOUS | Status: DC

## 2021-10-26 DIAGNOSIS — L821 Other seborrheic keratosis: Secondary | ICD-10-CM | POA: Diagnosis not present

## 2021-11-23 DIAGNOSIS — Z09 Encounter for follow-up examination after completed treatment for conditions other than malignant neoplasm: Secondary | ICD-10-CM | POA: Diagnosis not present

## 2021-11-23 DIAGNOSIS — Z8719 Personal history of other diseases of the digestive system: Secondary | ICD-10-CM | POA: Diagnosis not present

## 2021-11-30 ENCOUNTER — Encounter (HOSPITAL_COMMUNITY): Payer: Medicare Other

## 2021-12-05 ENCOUNTER — Ambulatory Visit (HOSPITAL_COMMUNITY)
Admission: RE | Admit: 2021-12-05 | Discharge: 2021-12-05 | Disposition: A | Discharge status: Home / Self Care | Payer: Medicare Other | Source: Ambulatory Visit | Attending: Gastroenterology | Admitting: Gastroenterology

## 2021-12-05 DIAGNOSIS — K5090 Crohn's disease, unspecified, without complications: Secondary | ICD-10-CM | POA: Diagnosis not present

## 2021-12-05 MED ORDER — DIPHENHYDRAMINE HCL 25 MG PO CAPS: 50.0000 mg | ORAL_CAPSULE | Freq: Once | ORAL | Status: DC

## 2021-12-05 MED ORDER — ACETAMINOPHEN 500 MG PO TABS: 500.0000 mg | ORAL_TABLET | Freq: Once | ORAL | Status: DC

## 2021-12-05 MED ORDER — SODIUM CHLORIDE 0.9 % IV SOLN
200.0000 mg | INTRAVENOUS | Status: DC
  Administered 2021-12-05: 200 mg via INTRAVENOUS
  Filled 2021-12-05: qty 20

## 2021-12-05 MED ORDER — SODIUM CHLORIDE 0.9 % IV SOLN: INTRAVENOUS | Status: DC

## 2021-12-05 MED ORDER — PREDNISONE 20 MG PO TABS: 20.0000 mg | ORAL_TABLET | Freq: Once | ORAL | Status: DC

## 2021-12-10 ENCOUNTER — Other Ambulatory Visit: Payer: Self-pay | Admitting: *Deleted

## 2021-12-10 MED ORDER — ESTRADIOL 0.1 MG/GM VA CREA: TOPICAL_CREAM | VAGINAL | 2 refills | Status: DC

## 2021-12-10 NOTE — Telephone Encounter (Signed)
Patient called requesting refill on estrace vaginal cream 0.1 mg. Last annual exam 03/13/21.

## 2021-12-12 ENCOUNTER — Other Ambulatory Visit: Payer: Self-pay | Admitting: Obstetrics & Gynecology

## 2021-12-12 NOTE — Telephone Encounter (Signed)
Med refill request: estradiol vaginal cream Last AEX: 03/13/2021 ML Next AEX: Not scheduled  Last MMG (if hormonal med) 10/22/2017 , Dx Right breast, Bi- Rads 2   Rx refused. Rx sent on 12/10/21 42.5 g/ 2RF  Routing to provider for final review. Will close encounter.

## 2021-12-13 MED ORDER — ESTRADIOL 0.1 MG/GM VA CREA: TOPICAL_CREAM | VAGINAL | 2 refills | Status: DC

## 2021-12-13 NOTE — Telephone Encounter (Signed)
Patient called back asking estradiol cream to be sent to [facility]. Rx sent.

## 2021-12-13 NOTE — Addendum Note (Signed)
Addended by: Thamas Jaegers on: 12/13/2021 03:28 PM   Modules accepted: Orders

## 2021-12-14 DIAGNOSIS — M8589 Other specified disorders of bone density and structure, multiple sites: Secondary | ICD-10-CM | POA: Diagnosis not present

## 2021-12-14 DIAGNOSIS — Z780 Asymptomatic menopausal state: Secondary | ICD-10-CM | POA: Diagnosis not present

## 2021-12-24 NOTE — Addendum Note (Signed)
Encounter addended by: Leonarda Salon, RN on: 12/24/2021 12:49 PM  Actions taken: MAR administration accepted, Charge Capture section accepted

## 2021-12-25 DIAGNOSIS — M8580 Other specified disorders of bone density and structure, unspecified site: Secondary | ICD-10-CM | POA: Diagnosis not present

## 2022-01-30 ENCOUNTER — Encounter (HOSPITAL_COMMUNITY): Payer: Medicare Other

## 2022-02-06 ENCOUNTER — Ambulatory Visit (HOSPITAL_COMMUNITY)
Admission: RE | Admit: 2022-02-06 | Discharge: 2022-02-06 | Disposition: A | Discharge status: Home / Self Care | Payer: Medicare Other | Source: Ambulatory Visit | Attending: Gastroenterology | Admitting: Gastroenterology

## 2022-02-06 DIAGNOSIS — K5090 Crohn's disease, unspecified, without complications: Secondary | ICD-10-CM | POA: Diagnosis not present

## 2022-02-06 MED ORDER — ACETAMINOPHEN 500 MG PO TABS: 500.0000 mg | ORAL_TABLET | Freq: Once | ORAL | Status: DC

## 2022-02-06 MED ORDER — SODIUM CHLORIDE 0.9 % IV SOLN: INTRAVENOUS | Status: DC

## 2022-02-06 MED ORDER — SODIUM CHLORIDE 0.9 % IV SOLN
200.0000 mg | INTRAVENOUS | Status: DC
  Administered 2022-02-06: 200 mg via INTRAVENOUS
  Filled 2022-02-06: qty 20

## 2022-02-06 MED ORDER — DIPHENHYDRAMINE HCL 25 MG PO CAPS: 50.0000 mg | ORAL_CAPSULE | Freq: Once | ORAL | Status: DC

## 2022-02-06 MED ORDER — PREDNISONE 20 MG PO TABS: 20.0000 mg | ORAL_TABLET | Freq: Once | ORAL | Status: DC

## 2022-03-29 DIAGNOSIS — E7800 Pure hypercholesterolemia, unspecified: Secondary | ICD-10-CM | POA: Diagnosis not present

## 2022-03-29 DIAGNOSIS — M8580 Other specified disorders of bone density and structure, unspecified site: Secondary | ICD-10-CM | POA: Diagnosis not present

## 2022-03-29 DIAGNOSIS — E559 Vitamin D deficiency, unspecified: Secondary | ICD-10-CM | POA: Diagnosis not present

## 2022-03-29 DIAGNOSIS — K5090 Crohn's disease, unspecified, without complications: Secondary | ICD-10-CM | POA: Diagnosis not present

## 2022-03-29 DIAGNOSIS — Z0000 Encounter for general adult medical examination without abnormal findings: Secondary | ICD-10-CM | POA: Diagnosis not present

## 2022-03-29 DIAGNOSIS — F5101 Primary insomnia: Secondary | ICD-10-CM | POA: Diagnosis not present

## 2022-03-29 DIAGNOSIS — R739 Hyperglycemia, unspecified: Secondary | ICD-10-CM | POA: Diagnosis not present

## 2022-04-01 DIAGNOSIS — E7800 Pure hypercholesterolemia, unspecified: Secondary | ICD-10-CM | POA: Diagnosis not present

## 2022-04-01 DIAGNOSIS — E559 Vitamin D deficiency, unspecified: Secondary | ICD-10-CM | POA: Diagnosis not present

## 2022-04-01 DIAGNOSIS — K5090 Crohn's disease, unspecified, without complications: Secondary | ICD-10-CM | POA: Diagnosis not present

## 2022-04-01 DIAGNOSIS — Z0000 Encounter for general adult medical examination without abnormal findings: Secondary | ICD-10-CM | POA: Diagnosis not present

## 2022-04-01 DIAGNOSIS — Z13220 Encounter for screening for lipoid disorders: Secondary | ICD-10-CM | POA: Diagnosis not present

## 2022-04-01 DIAGNOSIS — M8580 Other specified disorders of bone density and structure, unspecified site: Secondary | ICD-10-CM | POA: Diagnosis not present

## 2022-04-01 DIAGNOSIS — R739 Hyperglycemia, unspecified: Secondary | ICD-10-CM | POA: Diagnosis not present

## 2022-04-03 ENCOUNTER — Ambulatory Visit (INDEPENDENT_AMBULATORY_CARE_PROVIDER_SITE_OTHER): Payer: Medicare Other | Admitting: Obstetrics & Gynecology

## 2022-04-03 ENCOUNTER — Encounter: Payer: Self-pay | Admitting: Obstetrics & Gynecology

## 2022-04-03 VITALS — BP 118/70 | HR 75 | Ht 60.0 in | Wt 111.0 lb

## 2022-04-03 DIAGNOSIS — Z8619 Personal history of other infectious and parasitic diseases: Secondary | ICD-10-CM

## 2022-04-03 DIAGNOSIS — Z780 Asymptomatic menopausal state: Secondary | ICD-10-CM

## 2022-04-03 DIAGNOSIS — B009 Herpesviral infection, unspecified: Secondary | ICD-10-CM | POA: Diagnosis not present

## 2022-04-03 DIAGNOSIS — Z9289 Personal history of other medical treatment: Secondary | ICD-10-CM

## 2022-04-03 DIAGNOSIS — Z01419 Encounter for gynecological examination (general) (routine) without abnormal findings: Secondary | ICD-10-CM

## 2022-04-03 DIAGNOSIS — Z9189 Other specified personal risk factors, not elsewhere classified: Secondary | ICD-10-CM

## 2022-04-03 DIAGNOSIS — N952 Postmenopausal atrophic vaginitis: Secondary | ICD-10-CM

## 2022-04-03 DIAGNOSIS — M85852 Other specified disorders of bone density and structure, left thigh: Secondary | ICD-10-CM | POA: Diagnosis not present

## 2022-04-03 MED ORDER — ESTRADIOL 0.1 MG/GM VA CREA: TOPICAL_CREAM | VAGINAL | 2 refills | Status: DC

## 2022-04-03 NOTE — Progress Notes (Signed)
Cassandra Herring April 29, 1945 [id number]   History:    77 y.o. . G0  Widowed.  Has a boyfriend.   RP: Established patient presenting for annual gyn exam    HPI: Postmenopause on HRT x 20 yrs, now only taking Prometrium 100 mg HS and Estradiol cream vaginally twice a week.  No PMB. No pelvic pain.  Sexually active. No h/o abnormal Pap, last Pap Neg 05/2009. H/O Silicone breast implants which ruptured and were removed.  Last Mammo 09/2017, declines repeating a mammo because has too little breast tissue/scarring.  BMI 21.68.  Walking regularly. Taking Vit D and Ca++. Osteopenia BD 2023 per patient at [facility]. Mild stable SUI. Health labs with Fam MD. Crohn's disease stable. [place] 2023.   Past medical history,surgical history, family history and social history were all reviewed and documented in the EPIC chart.  Gynecologic History Patient's last menstrual period was 01/29/1999.  Obstetric History OB History  Gravida Para Term Preterm AB Living  0 0 0 0 0 0  SAB IAB Ectopic Multiple Live Births  0 0 0 0       ROS: A ROS was performed and pertinent positives and negatives are included in the history. GENERAL: No fevers or chills. HEENT: No change in vision, no earache, sore throat or sinus congestion. NECK: No pain or stiffness. CARDIOVASCULAR: No chest pain or pressure. No palpitations. PULMONARY: No shortness of breath, cough or wheeze. GASTROINTESTINAL: No abdominal pain, nausea, vomiting or diarrhea, melena or bright red blood per rectum. GENITOURINARY: No urinary frequency, urgency, hesitancy or dysuria. MUSCULOSKELETAL: No joint or muscle pain, no back pain, no recent trauma. DERMATOLOGIC: No rash, no itching, no lesions. ENDOCRINE: No polyuria, polydipsia, no heat or cold intolerance. No recent change in weight. HEMATOLOGICAL: No anemia or easy bruising or bleeding. NEUROLOGIC: No headache, seizures, numbness, tingling or weakness. PSYCHIATRIC: No depression, no loss of interest in normal  activity or change in sleep pattern.     Exam:   BP 118/70   Pulse 75   Ht 5' (1.524 m)   Wt 111 lb (50.3 kg)   LMP 01/29/1999 Comment: sexually active  SpO2 99%   BMI 21.68 kg/m   Body mass index is 21.68 kg/m.  General appearance : Well developed well nourished female. No acute distress HEENT: Eyes: no retinal hemorrhage or exudates,  Neck supple, trachea midline, no carotid bruits, no thyroidmegaly Lungs: Clear to auscultation, no rhonchi or wheezes, or rib retractions  Heart: Regular rate and rhythm, no murmurs or gallops Breast:Examined in sitting and supine position were symmetrical in appearance, no palpable masses or tenderness,  no skin retraction, no nipple inversion, no nipple discharge, no skin discoloration, no axillary or supraclavicular lymphadenopathy Abdomen: no palpable masses or tenderness, no rebound or guarding Extremities: no edema or skin discoloration or tenderness  Pelvic: Vulva: Normal             Vagina: No gross lesions or discharge  Cervix: No gross lesions or discharge  Uterus  AV, normal size, shape and consistency, non-tender and mobile  Adnexa  Without masses or tenderness  Anus: Normal   Assessment/Plan:  77 y.o. female for annual exam   1. Well female exam with routine gynecological exam Postmenopause on HRT x 20 yrs, now only taking Prometrium 100 mg HS and Estradiol cream vaginally twice a week.  No PMB. No pelvic pain.  Sexually active. No h/o abnormal Pap, last Pap Neg 05/2009. H/O Silicone breast implants which ruptured and  were removed.  Last Mammo 09/2017, declines repeating a mammo because has too little breast tissue/scarring.  BMI 21.68.  Walking regularly. Taking Vit D and Ca++. Osteopenia BD 2023 per patient at [facility]. Mild stable SUI. Health labs with Fam MD. Crohn's disease stable. [place] 2023.  2. Postmenopause Postmenopause on HRT x 20 yrs, now only taking Prometrium 100 mg HS.  Recommend stopping.  No PMB. No pelvic pain.  Sexually active.  3. Postmenopausal atrophic vaginitis Estradiol cream vaginally twice a week.  Will decrease to 1/2 an applicator twice a week.  4. Osteopenia of left hip Per patient BD Osteopenia at [place].  5. Other specified personal risk factors, not elsewhere classified  6. Personal history of other medical treatment  Other orders - UNABLE TO FIND; Med Name: azaleic acid 15%, metronidazole 1%, Ivermectin 1% cream at night   Princess Bruins MD, 2:26 PM

## 2022-04-04 ENCOUNTER — Telehealth: Payer: Self-pay | Admitting: *Deleted

## 2022-04-04 NOTE — Telephone Encounter (Signed)
Telephone encounter was:  Successful.  04/04/2022 Name: Cassandra Herring MRN: [id number] DOB: 1945/03/31  Cassandra Herring is a 77 y.o. year old female who is a primary care patient of Vernie Shanks, MD (Inactive) . The community resource team was consulted for assistance with Transportation Needs   Care guide performed the following interventions: Patient provided with information about care guide support team and interviewed to confirm resource needs.  Follow Up Plan:  No further follow up planned at this time. The patient has been provided with needed resources.  [facility] [phone] [address] , [place] [address] Email : [email or web address]        Cassandra Herring DOB: 25-Aug-1945 MRN: [id number]   RIDER WAIVER AND RELEASE OF LIABILITY  For purposes of improving physical access to our facilities, [facility] is pleased to partner with third parties to provide [facility] patients or other authorized individuals the option of convenient, on-demand ground transportation services (the [place]") through use of the technology service that enables users to request on-demand ground transportation from independent third-party providers.  By opting to use and accept these [employer], I, the undersigned, hereby agree on behalf of myself, and on behalf of any minor child using the Government social research officer for whom I am the parent or legal guardian, as follows:  Government social research officer provided to me are provided by independent third-party transportation providers who are not [employer] or employees and who are unaffiliated with [employer]. [facility] is neither a transportation carrier nor a common or public carrier. [facility] has no control over the quality or safety of the transportation that occurs as a result of the [employer]. [facility] cannot guarantee that any  third-party transportation provider will complete any arranged transportation service. [facility] makes no representation, warranty, or guarantee regarding the reliability, timeliness, quality, safety, suitability, or availability of any of the Transport Services or that they will be error free. I fully understand that traveling by vehicle involves risks and dangers of serious bodily injury, including permanent disability, paralysis, and death. I agree, on behalf of myself and on behalf of any minor child using the Transport Services for whom I am the parent or legal guardian, that the entire risk arising out of my use of the [employer] remains solely with me, to the maximum extent permitted under applicable law. The [employer] are provided "as is" and "as available." [facility] disclaims all representations and warranties, express, implied or statutory, not expressly set out in these terms, including the implied warranties of merchantability and fitness for a particular purpose. I hereby waive and release [facility], its agents, employees, officers, directors, representatives, insurers, attorneys, assigns, successors, subsidiaries, and affiliates from any and all past, present, or future claims, demands, liabilities, actions, causes of action, or suits of any kind directly or indirectly arising from acceptance and use of the [employer]. I further waive and release [facility] and its affiliates from all present and future liability and responsibility for any injury or death to persons or damages to property caused by or related to the use of the [employer]. I have read this Waiver and Release of Liability, and I understand the terms used in it and their legal significance. This Waiver is freely and voluntarily given with the understanding that my right (as well as the right of any minor child for whom I  am the parent or legal guardian using the [employer]) to legal  recourse against [facility] [place] [facility] in connection with the [employer] is knowingly surrendered in return for use of these services.   I attest that I read the consent document to Cassandra Herring, gave Ms. Curlene Dolphin the opportunity to ask questions and answered the questions asked (if any). I affirm that Cassandra Herring then provided consent for she's participation in this program.     Bonnielee Haff

## 2022-04-10 ENCOUNTER — Ambulatory Visit (HOSPITAL_COMMUNITY)
Admission: RE | Admit: 2022-04-10 | Discharge: 2022-04-10 | Disposition: A | Discharge status: Home / Self Care | Payer: Medicare Other | Source: Ambulatory Visit | Attending: Gastroenterology | Admitting: Gastroenterology

## 2022-04-10 DIAGNOSIS — K5090 Crohn's disease, unspecified, without complications: Secondary | ICD-10-CM | POA: Diagnosis not present

## 2022-04-10 MED ORDER — DIPHENHYDRAMINE HCL 25 MG PO CAPS: 50.0000 mg | ORAL_CAPSULE | Freq: Once | ORAL | Status: DC

## 2022-04-10 MED ORDER — SODIUM CHLORIDE 0.9 % IV SOLN
200.0000 mg | INTRAVENOUS | Status: DC
  Administered 2022-04-10: 200 mg via INTRAVENOUS
  Filled 2022-04-10: qty 20

## 2022-04-10 MED ORDER — PREDNISONE 20 MG PO TABS: 20.0000 mg | ORAL_TABLET | Freq: Once | ORAL | Status: DC

## 2022-04-10 MED ORDER — ACETAMINOPHEN 500 MG PO TABS: 500.0000 mg | ORAL_TABLET | Freq: Once | ORAL | Status: DC

## 2022-04-10 MED ORDER — SODIUM CHLORIDE 0.9 % IV SOLN: INTRAVENOUS | Status: DC

## 2022-04-18 DIAGNOSIS — L821 Other seborrheic keratosis: Secondary | ICD-10-CM | POA: Diagnosis not present

## 2022-04-18 DIAGNOSIS — Z808 Family history of malignant neoplasm of other organs or systems: Secondary | ICD-10-CM | POA: Diagnosis not present

## 2022-04-18 DIAGNOSIS — L578 Other skin changes due to chronic exposure to nonionizing radiation: Secondary | ICD-10-CM | POA: Diagnosis not present

## 2022-04-18 DIAGNOSIS — D225 Melanocytic nevi of trunk: Secondary | ICD-10-CM | POA: Diagnosis not present

## 2022-04-18 DIAGNOSIS — L2084 Intrinsic (allergic) eczema: Secondary | ICD-10-CM | POA: Diagnosis not present

## 2022-04-18 DIAGNOSIS — Z85828 Personal history of other malignant neoplasm of skin: Secondary | ICD-10-CM | POA: Diagnosis not present

## 2022-04-22 DIAGNOSIS — M5412 Radiculopathy, cervical region: Secondary | ICD-10-CM | POA: Diagnosis not present

## 2022-04-22 DIAGNOSIS — M5090 Cervical disc disorder, unspecified, unspecified cervical region: Secondary | ICD-10-CM | POA: Diagnosis not present

## 2022-04-22 DIAGNOSIS — H6121 Impacted cerumen, right ear: Secondary | ICD-10-CM | POA: Diagnosis not present

## 2022-04-22 DIAGNOSIS — M542 Cervicalgia: Secondary | ICD-10-CM | POA: Diagnosis not present

## 2022-04-22 DIAGNOSIS — E7800 Pure hypercholesterolemia, unspecified: Secondary | ICD-10-CM | POA: Diagnosis not present

## 2022-04-24 ENCOUNTER — Other Ambulatory Visit: Payer: Self-pay | Admitting: Physician Assistant

## 2022-04-24 DIAGNOSIS — E7800 Pure hypercholesterolemia, unspecified: Secondary | ICD-10-CM

## 2022-04-24 DIAGNOSIS — M542 Cervicalgia: Secondary | ICD-10-CM

## 2022-04-24 DIAGNOSIS — M5090 Cervical disc disorder, unspecified, unspecified cervical region: Secondary | ICD-10-CM

## 2022-05-01 ENCOUNTER — Encounter: Payer: Self-pay | Admitting: Obstetrics & Gynecology

## 2022-05-03 NOTE — Telephone Encounter (Signed)
Will route to provider for final review and close encounter.  

## 2022-05-14 DIAGNOSIS — H25813 Combined forms of age-related cataract, bilateral: Secondary | ICD-10-CM | POA: Diagnosis not present

## 2022-05-16 ENCOUNTER — Ambulatory Visit
Admission: RE | Admit: 2022-05-16 | Discharge: 2022-05-16 | Disposition: A | Discharge status: Home / Self Care | Payer: Medicare Other | Source: Ambulatory Visit | Attending: Physician Assistant | Admitting: Physician Assistant

## 2022-05-16 DIAGNOSIS — E785 Hyperlipidemia, unspecified: Secondary | ICD-10-CM | POA: Diagnosis not present

## 2022-05-16 DIAGNOSIS — M542 Cervicalgia: Secondary | ICD-10-CM

## 2022-05-16 DIAGNOSIS — R221 Localized swelling, mass and lump, neck: Secondary | ICD-10-CM | POA: Diagnosis not present

## 2022-05-16 DIAGNOSIS — E7800 Pure hypercholesterolemia, unspecified: Secondary | ICD-10-CM

## 2022-05-16 DIAGNOSIS — M5090 Cervical disc disorder, unspecified, unspecified cervical region: Secondary | ICD-10-CM

## 2022-05-17 ENCOUNTER — Encounter: Payer: Self-pay | Admitting: Obstetrics & Gynecology

## 2022-05-20 NOTE — Telephone Encounter (Signed)
Cassandra Del, MD  Selinda Eon, [place] minutes ago (3:00 PM)    Her weaning plan for Progesterone is fine. Dr Seymour Bars

## 2022-05-26 ENCOUNTER — Encounter: Payer: Self-pay | Admitting: Obstetrics & Gynecology

## 2022-05-26 DIAGNOSIS — A609 Anogenital herpesviral infection, unspecified: Secondary | ICD-10-CM

## 2022-05-27 MED ORDER — VALACYCLOVIR HCL 1 G PO TABS: 1000.0000 mg | ORAL_TABLET | Freq: Two times a day (BID) | ORAL | 3 refills | Status: DC

## 2022-05-27 NOTE — Telephone Encounter (Signed)
Last AEX 04/03/2022--recall placed for 2025. Rx pend.

## 2022-06-05 ENCOUNTER — Encounter (HOSPITAL_COMMUNITY): Payer: Medicare Other

## 2022-06-12 ENCOUNTER — Inpatient Hospital Stay (HOSPITAL_COMMUNITY): Admission: RE | Admit: 2022-06-12 | Payer: Medicare Other | Source: Ambulatory Visit

## 2022-06-14 ENCOUNTER — Ambulatory Visit (HOSPITAL_COMMUNITY)
Admission: RE | Admit: 2022-06-14 | Discharge: 2022-06-14 | Disposition: A | Discharge status: Home / Self Care | Payer: Medicare Other | Source: Ambulatory Visit | Attending: Gastroenterology | Admitting: Gastroenterology

## 2022-06-14 DIAGNOSIS — K5090 Crohn's disease, unspecified, without complications: Secondary | ICD-10-CM | POA: Insufficient documentation

## 2022-06-14 MED ORDER — SODIUM CHLORIDE 0.9 % IV SOLN: INTRAVENOUS | Status: DC

## 2022-06-14 MED ORDER — PREDNISONE 20 MG PO TABS: 20.0000 mg | ORAL_TABLET | Freq: Once | ORAL | Status: DC

## 2022-06-14 MED ORDER — ACETAMINOPHEN 500 MG PO TABS: 500.0000 mg | ORAL_TABLET | Freq: Once | ORAL | Status: DC

## 2022-06-14 MED ORDER — SODIUM CHLORIDE 0.9 % IV SOLN
200.0000 mg | INTRAVENOUS | Status: DC
  Administered 2022-06-14: 200 mg via INTRAVENOUS
  Filled 2022-06-14: qty 20

## 2022-06-14 MED ORDER — DIPHENHYDRAMINE HCL 25 MG PO CAPS: 50.0000 mg | ORAL_CAPSULE | Freq: Once | ORAL | Status: DC

## 2022-06-14 NOTE — Progress Notes (Signed)
Patient stated she spoke to Dr. Laurey Morale nurse last night and discussed still feeling fatigue and congestion since covid 4/28 and patient okay to proceed with remicade infusion. Patient stated nurse suggested we begin infusion at 20- which we will do.

## 2022-06-20 DIAGNOSIS — R059 Cough, unspecified: Secondary | ICD-10-CM | POA: Diagnosis not present

## 2022-06-27 DIAGNOSIS — H8112 Benign paroxysmal vertigo, left ear: Secondary | ICD-10-CM | POA: Insufficient documentation

## 2022-06-27 DIAGNOSIS — H6121 Impacted cerumen, right ear: Secondary | ICD-10-CM | POA: Diagnosis not present

## 2022-08-07 ENCOUNTER — Encounter (HOSPITAL_COMMUNITY): Payer: Medicare Other

## 2022-08-09 ENCOUNTER — Encounter (HOSPITAL_COMMUNITY): Payer: Medicare Other

## 2022-08-15 ENCOUNTER — Ambulatory Visit (HOSPITAL_COMMUNITY)
Admission: RE | Admit: 2022-08-15 | Discharge: 2022-08-15 | Disposition: A | Discharge status: Home / Self Care | Payer: Medicare Other | Source: Ambulatory Visit | Attending: Gastroenterology | Admitting: Gastroenterology

## 2022-08-15 DIAGNOSIS — K5090 Crohn's disease, unspecified, without complications: Secondary | ICD-10-CM | POA: Diagnosis not present

## 2022-08-15 MED ORDER — SODIUM CHLORIDE 0.9 % IV SOLN: INTRAVENOUS | Status: DC

## 2022-08-15 MED ORDER — PREDNISONE 20 MG PO TABS: 20.0000 mg | ORAL_TABLET | Freq: Once | ORAL | Status: DC

## 2022-08-15 MED ORDER — SODIUM CHLORIDE 0.9 % IV SOLN
200.0000 mg | INTRAVENOUS | Status: DC
  Administered 2022-08-15: 200 mg via INTRAVENOUS
  Filled 2022-08-15: qty 20

## 2022-08-15 MED ORDER — DIPHENHYDRAMINE HCL 25 MG PO CAPS: 50.0000 mg | ORAL_CAPSULE | Freq: Once | ORAL | Status: DC

## 2022-08-15 MED ORDER — ACETAMINOPHEN 500 MG PO TABS: 500.0000 mg | ORAL_TABLET | Freq: Once | ORAL | Status: DC

## 2022-09-12 ENCOUNTER — Ambulatory Visit: Payer: Medicare Other | Attending: Otolaryngology | Admitting: Physical Therapy

## 2022-09-12 ENCOUNTER — Other Ambulatory Visit: Payer: Self-pay

## 2022-09-12 ENCOUNTER — Encounter: Payer: Self-pay | Admitting: Physical Therapy

## 2022-09-12 VITALS — BP 147/67 | HR 75

## 2022-09-12 DIAGNOSIS — R2681 Unsteadiness on feet: Secondary | ICD-10-CM | POA: Insufficient documentation

## 2022-09-12 DIAGNOSIS — R42 Dizziness and giddiness: Secondary | ICD-10-CM | POA: Diagnosis not present

## 2022-09-12 NOTE — Therapy (Signed)
OUTPATIENT PHYSICAL THERAPY VESTIBULAR EVALUATION     Patient Name: Angelyn Nakasone MRN: [id number] DOB:Aug 27, 1945, 77 y.o., female Today's Date: 09/12/2022  END OF SESSION:  PT End of Session - 09/12/22 1221     Visit Number 1    Number of Visits 9   including eval   Date for PT Re-Evaluation 10/24/22    Authorization Type Medicare    PT Start Time 1230    PT Stop Time 1319    PT Time Calculation (min) 49 min    Equipment Utilized During Treatment Other (comment)   performed at seated level; gait belt recommended for higher level balance work   Activity Tolerance Patient tolerated treatment well    Behavior During Therapy WFL for tasks assessed/performed             Past Medical History:  Diagnosis Date   Anemia    Basal cell carcinoma    Bursitis    left shoulder    Crohn disease (HCC)    HNP (herniated nucleus pulposus)    Mass    gluteal   Osteopenia    Partial tear of left subscapularis tendon    STD (sexually transmitted disease)    Hx of genital warts   Tendon tear    shoulder   Thyroid nodule    Vertigo    Vitamin D deficiency    Past Surgical History:  Procedure Laterality Date   BREAST SURGERY     implants-1981, removal-2003   MOHS SURGERY Left 10/27/2020   left side of nose   Patient Active Problem List   Diagnosis Date Noted   Drug reaction 05/01/2020   Crohn's disease with complication (HCC) 09/29/2018   Female climacteric state 11/25/2017   Osteopenia of left hip 11/25/2017   Rosacea 11/24/2017   Thyroid nodule 06/27/2015   Mass 09/27/2011    PCP: Ileana Ladd., MD REFERRING PROVIDER: Christia Reading, MD  REFERRING DIAG: R42 (ICD-10-CM) - Dizziness  THERAPY DIAG:  Dizziness and giddiness - Plan: PT plan of care cert/re-cert  Unsteadiness on feet - Plan: PT plan of care cert/re-cert  ONSET DATE: 09/03/2022 (referral date)  Rationale for Evaluation and Treatment: Rehabilitation  SUBJECTIVE:   SUBJECTIVE  STATEMENT: Patient reports that she had an episode of vertigo in March 1997. Patient was washing her hair and when she sat up the room was spinning. She got very nauseous; patient was diagnosed with BPPV at this time. She was never treated with Epley. Patient reports 3 more episodes. She had another one several years ago. She does tend to vomit with symptoms. Patient reports that it always consistently happens when going to the left. Patient avoids looking up now due to fear of it occurring again. Patient reports that her most recent episode was in the early 2000s. She now avoids all positions that could trigger vertigo. Patient arrives to session without AD. Patient is a retired Pharmacist, community, Cytogeneticist.  Pt accompanied by: self  PERTINENT HISTORY: suspected L BPPV, basal cell carcinoma in remission, 2002 herniated disc C5-6, nerve compression C5-6 with small central disc proturision at C6-7 found in 2015, osteopenia July 2001, Chron's Disease, R bicep tendon tear   PAIN:  Are you having pain? No  PRECAUTIONS: Fall, limited ability to lie on R side due to severe bicep tear  RED FLAGS: None   WEIGHT BEARING RESTRICTIONS: No  FALLS: Has patient fallen in last 6 months? 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  COGNITION: Overall cognitive status: Within functional limits for tasks assessed   SENSATION: WFL  EDEMA:  Abscent   POSTURE:  rounded shoulders and forward head  Cervical ROM:    Active A/PROM (deg) eval  Flexion 60%  Extension 40%  Right lateral flexion WFL  Left lateral flexion WFL  Right rotation WFL  Left  rotation WFL  (Blank rows = not tested)  GAIT: Gait pattern: step through pattern, slow and reduced cervical movements Distance walked: 2 x 60 feet Assistive device utilized: None Level of assistance: SBA  PATIENT SURVEYS:  FOTO 39  VESTIBULAR ASSESSMENT:  GENERAL OBSERVATION: guarded cervical, ambulates without AD, wears glasses sometimes but not wearing currently   SYMPTOM BEHAVIOR: Subjective history: symptoms started in 1997, avoided movements since then Non-Vestibular symptoms: changes in vision and nausea/vomiting Type of dizziness: Blurred Vision, Imbalance (Disequilibrium), Spinning/Vertigo, Unsteady with head/body turns, "Funny feeling in the head", and "World moves" Frequency: 3x as noted in subjective since 1997, now only gets brief episodes of feeling like about to spin even laying back, avoids trigger position since early 2000s Duration: once started it was constant, minor spinning for week afterwards  Aggravating factors: Induced by position change: lying supine  Relieving factors: head stationary and lying supine  Progression of symptoms: better  OCULOMOTOR EXAM:  Ocular Alignment: normal  Ocular ROM: No Limitations  Spontaneous Nystagmus: absent  Gaze-Induced Nystagmus: absent  Smooth Pursuits: intact  Saccades: intact  Convergence/Divergence: < 5 cm   Test of Skew: eyes WNL   VBI: negative bilaterally  VESTIBULAR - OCULAR REFLEX:   Slow VOR: Normal  VOR Cancellation: Normal Head-Impulse Test: Negative - Right, mild correction L but grossly WNL and not noted when retested, no dizziness reported   Dynamic Visual Acuity: To be assessed   POSITIONAL TESTING:  To be assessed  MOTION SENSITIVITY:  Motion Sensitivity Quotient Intensity: 0 = none, 1 = Lightheaded, 2 = Mild, 3 = Moderate, 4 = Severe, 5 = Vomiting  Intensity  1. Sitting to supine   2. Supine to L side   3. Supine to R side   4. Supine to sitting   5. L Hallpike-Dix   6. Up from L    7. R  Hallpike-Dix   8. Up from R    9. Sitting, head tipped to L knee   10. Head up from L knee   11. Sitting, head tipped to R knee   12. Head up from R knee   13. Sitting head turns x5 Denies dizziness reports feeling "off"  14.Sitting head nods x5 Denies dizziness reports feeling "off"  15. In stance, 180 turn to L    16. In stance, 180 turn to R     VESTIBULAR TREATMENT:  Initial eval only  PATIENT EDUCATION: Education details: POC, goal collaboration,  Person educated: Patient Education method: Explanation Education comprehension: verbalized understanding and needs further education  HOME EXERCISE PROGRAM:  To be provided  GOALS: Goals reviewed with patient? Yes  LONG TERM GOALS: Target date: 10/24/2022 (LTG = STG due to POC length)  Patient will report demonstrate independence with final HEP in order to maintain current gains and continue to progress after physical therapy discharge.   Baseline: To be provided Goal status: INITIAL  2.  Patient will improve FOTO score to 59 to achieve predicted improvements in functional mobility due to skilled physical therapy interventions to increase safety with and participation in daily activities.  Baseline: 39 Goal status: INITIAL  3.  MSQ to be assessed / LTG written Baseline: To be assessed Goal status: INITIAL  4.  FGA to be assessed / LTG written Baseline: To be assessed Goal status: INITIAL  5.  SOT to be assessed / LTG written  Baseline: To be assessed Goal status: INITIAL  ASSESSMENT:  CLINICAL IMPRESSION: Patient is a 77 y.o. female who was seen today for physical therapy evaluation and treatment for long history of vertigo. Patient was diagnosed with suspected L posterior canal BPPV in 1997 and has had 3 episodes of vertigo since then, the last which was in early 2000s. Since this time patient goes to extensive  lengths to avoid head movements to avoid triggering symptoms.Based on subjective description consider BPPV versus other peripheral cause as duration abnormal for typical BPPV. Patient had very mild correction with HIT to left but was not noted repeated testing. Will plan for further vestibular assessment in future session to determine ongoing deficits and recommend modified positional testing given cervical history. Consider residual cervicogenic component of dizziness as well. Patient also does present with significant fear avoidance patterns; so if findings indicate resolution of vertigo, therapist recommends habituation techniques to help patient return to baseline function. Patient will benefit from skilled physical therapy services to maximize function and help return to baseline function.   OBJECTIVE IMPAIRMENTS: Abnormal gait, decreased balance, decreased ROM, and dizziness.   ACTIVITY LIMITATIONS: bending, transfers, bed mobility, and reach over head  PARTICIPATION LIMITATIONS: cleaning, laundry, and community activity  PERSONAL FACTORS: Age, Time since onset of injury/illness/exacerbation, and 3+ comorbidities: see above  are also affecting patient's functional outcome.   REHAB POTENTIAL: Good  CLINICAL DECISION MAKING: Stable/uncomplicated  EVALUATION COMPLEXITY: Low   PLAN:  PT FREQUENCY: 1-2x/week  PT DURATION: 4 weeks  PLANNED INTERVENTIONS: Therapeutic exercises, Therapeutic activity, Neuromuscular re-education, Balance training, Gait training, Patient/Family education, Self Care, Joint mobilization, Vestibular training, Canalith repositioning, Manual therapy, and Re-evaluation  PLAN FOR NEXT SESSION: finish MSQ, assess positional testing (modifed) treat as indicated, assess SOT/FGA and write LTG as needed   Carmelia Bake, PT 09/12/2022, 5:09 PM

## 2022-09-20 ENCOUNTER — Encounter: Payer: Self-pay | Admitting: Physical Therapy

## 2022-09-20 ENCOUNTER — Ambulatory Visit: Payer: Medicare Other | Admitting: Physical Therapy

## 2022-09-20 DIAGNOSIS — R42 Dizziness and giddiness: Secondary | ICD-10-CM

## 2022-09-20 DIAGNOSIS — R2681 Unsteadiness on feet: Secondary | ICD-10-CM | POA: Diagnosis not present

## 2022-09-20 NOTE — Patient Instructions (Signed)
Gaze Stabilization: Sitting    Keeping eyes on target on wall 3-5 feet away, tilt head down 15-30 and move head side to side for 30 seconds. Repeat while moving head up and down for 30 seconds. Do 6 sessions per day of each. Your symptoms should not increase above 1-2 points from baseline. Perform on a plain background. Repeat using target on pattern background.

## 2022-09-20 NOTE — Therapy (Signed)
OUTPATIENT PHYSICAL THERAPY VESTIBULAR TREATMENT   Patient Name: Cassandra Herring MRN: [id number] DOB:1945/05/04, 77 y.o., female Today's Date: 09/20/2022  END OF SESSION:  PT End of Session - 09/20/22 1359     Visit Number 2    Number of Visits 9    Date for PT Re-Evaluation 10/24/22    Authorization Type Medicare    PT Start Time 1400    PT Stop Time 1505    PT Time Calculation (min) 65 min    Equipment Utilized During Treatment Other (comment)    Activity Tolerance Patient tolerated treatment well    Behavior During Therapy WFL for tasks assessed/performed             Past Medical History:  Diagnosis Date   Anemia    Basal cell carcinoma    Bursitis    left shoulder    Crohn disease (HCC)    HNP (herniated nucleus pulposus)    Mass    gluteal   Osteopenia    Partial tear of left subscapularis tendon    STD (sexually transmitted disease)    Hx of genital warts   Tendon tear    shoulder   Thyroid nodule    Vertigo    Vitamin D deficiency    Past Surgical History:  Procedure Laterality Date   BREAST SURGERY     implants-1981, removal-2003   MOHS SURGERY Left 10/27/2020   left side of nose   Patient Active Problem List   Diagnosis Date Noted   Drug reaction 05/01/2020   Crohn's disease with complication (HCC) 09/29/2018   Female climacteric state 11/25/2017   Osteopenia of left hip 11/25/2017   Rosacea 11/24/2017   Thyroid nodule 06/27/2015   Mass 09/27/2011    PCP: Ileana Ladd., MD REFERRING PROVIDER: Christia Reading, MD  REFERRING DIAG: R42 (ICD-10-CM) - Dizziness  THERAPY DIAG:  Dizziness and giddiness  Unsteadiness on feet  ONSET DATE: 09/03/2022 (referral date)  Rationale for Evaluation and Treatment: Rehabilitation  SUBJECTIVE:   SUBJECTIVE STATEMENT: Patient arrives to session and states she didn't have any bad effects since her last PT visit. She had some mild numbness in R hand after visit but has since gone away and is  doing well. Denies falls/near falls. Reports mild dizziness when turning to the L or bending forward too far when cleaning teeth or throwing head to fast.   Pt accompanied by: self  PERTINENT HISTORY: suspected L BPPV, basal cell carcinoma in remission, 2002 herniated disc C5-6, nerve compression C5-6 with small central disc proturision at C6-7 found in 2015, osteopenia July 2001, Chron's Disease, R bicep tendon tear   PAIN:  Are you having pain? No  PRECAUTIONS: Fall, limited ability to lie on R side due to severe bicep tear  RED FLAGS: None   WEIGHT BEARING RESTRICTIONS: No  FALLS: Has patient fallen in last 6 months? 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  COGNITION: Overall cognitive status: Within functional limits for tasks assessed  VESTIBULAR TREATMENT:  NMR:  POSITIONAL TESTING:  Roll test: negative bilaterally R Sidelying Test for modified Posterior canal testing: negative L Sidelying Test for modified posterior canal testing: negative   Briefly reviewed positional treatment with modifications given patient request; educated thoroughly on safety measures given cervical history; patient verbalized understanding  MOTION SENSITIVITY:  Motion Sensitivity Quotient Intensity: 0 = none, 1 = Lightheaded, 2 = Mild, 3 = Moderate, 4 = Severe, 5 = Vomiting  Intensity  1. Sitting to supine 0  2. Supine to L side 0  3. Supine to R side 0  4. Supine to sitting 0  5. (Modified sidelying positional test) 0  6. Up from L  0  7. (Modified  sidelying positional test) 0  8. Up from R  0  9. Sitting, head tipped to L knee 0  10. Head up from L knee 0  11. Sitting, head tipped to R knee 0  12. Head up from R knee 0  13. Sitting head turns x5 Denies dizziness reports feeling "off"  14.Sitting head nods x5 Denies dizziness reports feeling "off"  15. In stance, 180 turn to L  0  16. In stance, 180 turn to R 0     VESTIBULAR TREATMENT:  Gaze Adaptation: x1 Viewing Horizontal: Position: seated, Time: 30 seconds, Reps: 1, and Comment: requires cueing on pacing, mild increase in symptoms and x1 Viewing Vertical:  Position: seated, Time: 30 seconds, Reps: 1, and Comment: increased difficulty compared to horizontal   TherAct:  [facility] PT Assessment - 09/20/22 0001       Functional Gait  Assessment   Gait assessed  Yes    Gait Level Surface Walks 20 ft in less than 5.5 sec, no assistive devices, good speed, no evidence for imbalance, normal gait pattern, deviates no more than 6 in outside of the 12 in walkway width.    Change in Gait Speed Able to smoothly change walking speed without loss of balance or gait deviation. Deviate no more than 6 in outside of the 12 in walkway width.    Gait with Horizontal Head Turns Performs head turns smoothly with no change in gait. Deviates no more than 6 in outside 12 in walkway width    Gait with Vertical Head Turns Performs task with slight change in gait velocity (eg, minor disruption to smooth gait path), deviates 6 - 10 in outside 12 in walkway width or uses assistive device    Gait and Pivot Turn Pivot turns safely within 3 sec and stops quickly with no loss of balance.    Step Over Obstacle Is able to step over 2 stacked shoe boxes taped together (9 in total height) without changing gait speed. No evidence of imbalance.    Gait with Narrow Base of Support Ambulates 7-9 steps.    Gait with Eyes Closed Walks 20 ft, slow speed, abnormal gait pattern, evidence for imbalance, deviates 10-15 in  outside 12 in walkway width. Requires more than 9 sec to ambulate 20 ft.    Ambulating Backwards Walks 20 ft, no assistive devices, good speed, no evidence for imbalance, normal gait    Steps Alternating feet, no rail.    Total Score 26    FGA comment: 26/30 = not falls            PATIENT EDUCATION: Education details: Examination findings +initiated HEP Person educated: Patient Education method: Explanation Education comprehension: verbalized understanding and needs further education  HOME EXERCISE PROGRAM:     Keeping eyes  on target on wall 3-5 feet away, tilt head down 15-30 and move head side to side for 30 seconds. Repeat while moving head up and down for 30 seconds. Do 6 sessions per day of each. Your symptoms should not increase above 1-2 points from baseline. Perform on a plain background. Repeat using target on pattern background.  Provided printouts of modified positional testing per patient request for home; strongly advised patient to return to PT if return of vertigo given neck history and educated on safety measures with maneuvers. Patient verbalized understanding.   GOALS: Goals reviewed with patient? Yes  LONG TERM GOALS: Target date: 10/24/2022 (LTG = STG due to POC length)  Patient will report demonstrate independence with final HEP in order to maintain current gains and continue to progress after physical therapy discharge.   Baseline: Provided on 8/23 Goal status: INITIAL  2.  Patient will improve FOTO score to 59 to achieve predicted improvements in functional mobility due to skilled physical therapy interventions to increase safety with and participation in daily activities.  Baseline: 39 Goal status: INITIAL  3.  MSQ to be assessed / LTG written Baseline: WFL Goal status: Discontinued  4.  Patient will score 3/3 with forward walking with vertical head turns on FGA to demonstrate reduced vestibular impairment.  Baseline: 2/3 Goal status:  INITIAL  5.  SOT to be assessed / LTG written  Baseline: To be assessed Goal status: INITIAL  ASSESSMENT:  CLINICAL IMPRESSION: Skilled session emphasized modified positional testing, completion of MSQ, introduction of VOR x 1 viewing, and assessment of FGA. Patient demonstrates negative positional testing indicating no signs of BPPV. MSQ also largely unremarkable. Patient demonstrates fear avoidance behavior with certain movements such as looking up toward ceiling or moving head down quickly. Very minor sway noted with vertical head turns, eyes closed gait, and tandem balance on FGA; however, patient well outside falls risk and operating at high level. Patient will benefit from SOT assessment next session to further assess vestibular impairments; pending progress, consider early D/C as patient continues to progress.   OBJECTIVE IMPAIRMENTS: Abnormal gait, decreased balance, decreased ROM, and dizziness.   ACTIVITY LIMITATIONS: bending, transfers, bed mobility, and reach over head  PARTICIPATION LIMITATIONS: cleaning, laundry, and community activity  PERSONAL FACTORS: Age, Time since onset of injury/illness/exacerbation, and 3+ comorbidities: see above  are also affecting patient's functional outcome.   REHAB POTENTIAL: Good  CLINICAL DECISION MAKING: Stable/uncomplicated  EVALUATION COMPLEXITY: Low   PLAN:  PT FREQUENCY: 1-2x/week  PT DURATION: 4 weeks  PLANNED INTERVENTIONS: Therapeutic exercises, Therapeutic activity, Neuromuscular re-education, Balance training, Gait training, Patient/Family education, Self Care, Joint mobilization, Vestibular training, Canalith repositioning, Manual therapy, and Re-evaluation  PLAN FOR NEXT SESSION: assess SOT and write LTG as needed, progress VOR x 1 viewing, HEP with tandem walking/basic balance exercises, pending progress consider early D/C  Carmelia Bake, PT, DPT 09/20/2022, 3:47 PM

## 2022-09-24 ENCOUNTER — Ambulatory Visit: Payer: Medicare Other

## 2022-09-24 DIAGNOSIS — R2681 Unsteadiness on feet: Secondary | ICD-10-CM

## 2022-09-24 DIAGNOSIS — R42 Dizziness and giddiness: Secondary | ICD-10-CM | POA: Diagnosis not present

## 2022-09-24 NOTE — Therapy (Signed)
OUTPATIENT PHYSICAL THERAPY VESTIBULAR TREATMENT   Patient Name: Cassandra Herring MRN: [id number] DOB:21-Jan-1946, 77 y.o., female Today's Date: 09/24/2022  END OF SESSION:  PT End of Session - 09/24/22 1306     Visit Number 3    Number of Visits 9    Date for PT Re-Evaluation 10/24/22    Authorization Type Medicare    PT Start Time 1315    PT Stop Time 1400    PT Time Calculation (min) 45 min    Activity Tolerance Patient tolerated treatment well    Behavior During Therapy WFL for tasks assessed/performed             Past Medical History:  Diagnosis Date   Anemia    Basal cell carcinoma    Bursitis    left shoulder    Crohn disease (HCC)    HNP (herniated nucleus pulposus)    Mass    gluteal   Osteopenia    Partial tear of left subscapularis tendon    STD (sexually transmitted disease)    Hx of genital warts   Tendon tear    shoulder   Thyroid nodule    Vertigo    Vitamin D deficiency    Past Surgical History:  Procedure Laterality Date   BREAST SURGERY     implants-1981, removal-2003   MOHS SURGERY Left 10/27/2020   left side of nose   Patient Active Problem List   Diagnosis Date Noted   Drug reaction 05/01/2020   Crohn's disease with complication (HCC) 09/29/2018   Female climacteric state 11/25/2017   Osteopenia of left hip 11/25/2017   Rosacea 11/24/2017   Thyroid nodule 06/27/2015   Mass 09/27/2011    PCP: Ileana Ladd., MD REFERRING PROVIDER: Christia Reading, MD  PHYSICAL THERAPY DISCHARGE SUMMARY  Visits from Start of Care: 3  Current functional level related to goals / functional outcomes: See below   Remaining deficits: See below   Education / Equipment: PT POC, exam findings, HEP   Patient agrees to discharge. Patient goals were met. Patient is being discharged due to meeting the stated rehab goals.  REFERRING DIAG: R42 (ICD-10-CM) - Dizziness  THERAPY DIAG:  Dizziness and giddiness  Unsteadiness on feet  ONSET  DATE: 09/03/2022 (referral date)  Rationale for Evaluation and Treatment: Rehabilitation  SUBJECTIVE:   SUBJECTIVE STATEMENT: Patient reports increased N/T in R index/thumb and thinks it was related to the "x exercises." Denies any other changes. Denies falls. Agreeable to dc.   Pt accompanied by: self  PERTINENT HISTORY: suspected L BPPV, basal cell carcinoma in remission, 2002 herniated disc C5-6, nerve compression C5-6 with small central disc proturision at C6-7 found in 2015, osteopenia July 2001, Chron's Disease, R bicep tendon tear   PAIN:  Are you having pain? No  PRECAUTIONS: Fall, limited ability to lie on R side due to severe bicep tear   PATIENT GOALS: "To learn how to manage the symptoms as they occur."   OBJECTIVE:   DIAGNOSTIC FINDINGS:   05/16/2022 Carotid Bilteral Utlrasound: IMPRESSION: Unremarkable carotid Doppler ultrasound.  COGNITION: Overall cognitive status: Within functional limits for tasks assessed  VESTIBULAR TREATMENT:  SOT: Condition 1: pass 3/3  Condition 2: pass 3/3  Condition 3: pass 3/3  Condition 4: pass 3/3  Condition 5: pass 3/3  Condition 6: pass 3/3  Composite: 77 Sensory analysis: pass all   - provided patient with Austin Miles exercises  PATIENT EDUCATION: Education details: Examination findings, brandt daroff Person educated: Patient Education method: Explanation Education comprehension: verbalized understanding and needs further education  HOME EXERCISE PROGRAM:     Keeping eyes on target on wall 3-5 feet away, tilt head down 15-30 and move head side to side for 30 seconds. Repeat while moving head up and down for 30 seconds. Do 6 sessions per day of each. Your symptoms should not increase above 1-2 points from baseline. Perform on a plain background. Repeat using target on pattern background.  Provided printouts of  modified positional testing per patient request for home; strongly advised patient to return to PT if return of vertigo given neck history and educated on safety measures with maneuvers. Patient verbalized understanding.   Sit to Side-Lying    Sit on edge of bed. 1. Turn head 45 to right. 2. Maintain head position and lie down slowly on left side. Hold until symptoms subside. 3. Sit up slowly. Hold until symptoms subside. 4. Turn head 45 to left. 5. Maintain head position and lie down slowly on right side. Hold until symptoms subside. 6. Sit up slowly. Repeat sequence ____ times per session. Do ____ sessions per day.   GOALS: Goals reviewed with patient? Yes  LONG TERM GOALS: Target date: 10/24/2022 (LTG = STG due to POC length)  Patient will report demonstrate independence with final HEP in order to maintain current gains and continue to progress after physical therapy discharge.   Baseline: Provided on 8/23 Goal status: INITIAL  2.  Patient will improve FOTO score to 59 to achieve predicted improvements in functional mobility due to skilled physical therapy interventions to increase safety with and participation in daily activities.  Baseline: 39 Goal status: INITIAL  3.  MSQ to be assessed / LTG written Baseline: WFL Goal status: Discontinued  4.  Patient will score 3/3 with forward walking with vertical head turns on FGA to demonstrate reduced vestibular impairment.  Baseline: 2/3 Goal status: INITIAL  5.  SOT to be assessed / LTG written  Baseline: Not indicated Goal status: MET  ASSESSMENT:  CLINICAL IMPRESSION: Patient seen for skilled PT session with emphasis on vestibular assessment and exercises. SOT demonstrating no vestibular dysfunction. She potentially did have an episode of BPPV, but has since resolved. Her guarded c-spine limits treatment options for BPPV or any vestibular dysfunction. Discussed using Austin Miles exercises vs portions of [place] to prevent  converting to horizontal canal BPPV. Patient verbalized understanding. Patient agreeable to dc at this time.   OBJECTIVE IMPAIRMENTS: Abnormal gait, decreased balance, decreased ROM, and dizziness.   ACTIVITY LIMITATIONS: bending, transfers, bed mobility, and reach over head  PARTICIPATION LIMITATIONS: cleaning, laundry, and community activity  PERSONAL FACTORS: Age, Time since onset of injury/illness/exacerbation, and 3+ comorbidities: see above  are also affecting patient's functional outcome.   REHAB POTENTIAL: Good  CLINICAL DECISION MAKING: Stable/uncomplicated  EVALUATION COMPLEXITY: Low   PLAN:  PT FREQUENCY: 1-2x/week  PT DURATION: 4 weeks  PLANNED INTERVENTIONS: Therapeutic exercises, Therapeutic activity, Neuromuscular re-education, Balance training, Gait training, Patient/Family education, Self Care, Joint mobilization, Vestibular training, Canalith repositioning, Manual therapy, and Re-evaluation  PLAN FOR NEXT SESSION: dc from PT  Westley Foots, PT Westley Foots, PT,  DPT, CBIS  09/24/2022, 2:54 PM

## 2022-09-26 ENCOUNTER — Ambulatory Visit: Payer: Medicare Other

## 2022-10-01 DIAGNOSIS — F5101 Primary insomnia: Secondary | ICD-10-CM | POA: Diagnosis not present

## 2022-10-04 ENCOUNTER — Encounter: Payer: Medicare Other | Admitting: Physical Therapy

## 2022-10-07 ENCOUNTER — Encounter: Payer: Medicare Other | Admitting: Physical Therapy

## 2022-10-11 ENCOUNTER — Encounter: Payer: Medicare Other | Admitting: Physical Therapy

## 2022-10-14 ENCOUNTER — Other Ambulatory Visit (HOSPITAL_COMMUNITY): Payer: Self-pay | Admitting: *Deleted

## 2022-10-16 ENCOUNTER — Ambulatory Visit (HOSPITAL_COMMUNITY)
Admission: RE | Admit: 2022-10-16 | Discharge: 2022-10-16 | Disposition: A | Discharge status: Home / Self Care | Payer: Medicare Other | Source: Ambulatory Visit | Attending: Gastroenterology | Admitting: Gastroenterology

## 2022-10-16 DIAGNOSIS — K5090 Crohn's disease, unspecified, without complications: Secondary | ICD-10-CM | POA: Insufficient documentation

## 2022-10-16 MED ORDER — SODIUM CHLORIDE 0.9 % IV SOLN
200.0000 mg | INTRAVENOUS | Status: DC
  Administered 2022-10-16: 200 mg via INTRAVENOUS
  Filled 2022-10-16: qty 20

## 2022-10-16 MED ORDER — SODIUM CHLORIDE 0.9 % IV SOLN: INTRAVENOUS | Status: DC

## 2022-11-06 ENCOUNTER — Ambulatory Visit (INDEPENDENT_AMBULATORY_CARE_PROVIDER_SITE_OTHER): Payer: Medicare Other | Admitting: Obstetrics and Gynecology

## 2022-11-06 ENCOUNTER — Encounter: Payer: Self-pay | Admitting: Obstetrics and Gynecology

## 2022-11-06 VITALS — BP 122/68 | HR 78 | Ht 60.75 in | Wt 111.2 lb

## 2022-11-06 DIAGNOSIS — R351 Nocturia: Secondary | ICD-10-CM

## 2022-11-06 DIAGNOSIS — N3281 Overactive bladder: Secondary | ICD-10-CM | POA: Diagnosis not present

## 2022-11-06 DIAGNOSIS — R32 Unspecified urinary incontinence: Secondary | ICD-10-CM | POA: Diagnosis not present

## 2022-11-06 LAB — URINALYSIS, COMPLETE W/RFL CULTURE
Bacteria, UA: NONE SEEN /[HPF]
Bilirubin Urine: NEGATIVE
Glucose, UA: NEGATIVE
Hyaline Cast: NONE SEEN /[LPF]
Ketones, ur: NEGATIVE
Leukocyte Esterase: NEGATIVE
Nitrites, Initial: NEGATIVE
Protein, ur: NEGATIVE
RBC / HPF: NONE SEEN /[HPF] (ref 0–2)
Specific Gravity, Urine: 1.006 (ref 1.001–1.035)
WBC, UA: NONE SEEN /[HPF] (ref 0–5)
pH: 6 (ref 5.0–8.0)

## 2022-11-06 LAB — NO CULTURE INDICATED

## 2022-11-06 NOTE — Assessment & Plan Note (Signed)
Patient with OAB symptoms. Education on cause provided. Discussed lifestyle modifications for management, including avoidance of fluid prior to bed and bladder irritants. Also discussion PFPT and medical managements. Patient desires PFPT. Referral placed. [facility] pamphlets for bladder training and OAB provided. F/u in 3 months.

## 2022-11-06 NOTE — Progress Notes (Signed)
77 y.o. G0P0000 postmenopausal female with osteopenia, GSM here for urinary incontinence.  Incontinence She is getting up many times in the night. She says that a couple of days ago she had a "spirt" of urine while laying in bed. She is wondering about medication for incontinence . She is wanting an opinion on leak proof underwear.   Drinks "a lot during during the day", drinks 32oz of water, 8oz smoothie, 8oz green tea, 1-3 12 oz nutritional drink. Has 8oz a bedtime with meds. Normal bedtime at 8pm. ROS +urinary frequency, urgency, nocturia, stool incontinence  GYN HISTORY: Osteopenia GSM  OB History  Gravida Para Term Preterm AB Living  0 0 0 0 0 0  SAB IAB Ectopic Multiple Live Births  0 0 0 0      Past Medical History:  Diagnosis Date   Anemia    Basal cell carcinoma    Bursitis    left shoulder    Crohn disease (HCC)    HNP (herniated nucleus pulposus)    Mass    gluteal   Osteopenia    Partial tear of left subscapularis tendon    STD (sexually transmitted disease)    Hx of genital warts   Tendon tear    shoulder   Thyroid nodule    Vertigo    Vitamin D deficiency     Past Surgical History:  Procedure Laterality Date   BREAST SURGERY     implants-1981, removal-2003   MOHS SURGERY Left 10/27/2020   left side of nose    Current Outpatient Medications on File Prior to Visit  Medication Sig Dispense Refill   acetaminophen (TYLENOL) 500 MG tablet Take 500 mg by mouth as needed. 1 tab before remicaide treatment     alclomethasone (ACLOVATE) 0.05 % cream Apply 1 application topically 2 (two) times daily.     Ascorbic Acid (VITAMIN C) 1000 MG tablet Take 1,000 mg by mouth daily.     Black Cohosh 21 MG TABS Take by mouth.     BORON PO Take by mouth.     CALCIUM PO Take by mouth daily.     Cetirizine HCl (ZYRTEC ALLERGY PO) Take by mouth as needed.     COPPER PO Take by mouth.     diphenhydrAMINE (BENADRYL) 50 MG capsule Take 50 mg by mouth every 6 (six)  hours as needed. 1 tab before Remicade treatment     estradiol (ESTRACE) 0.1 MG/GM vaginal cream Insert 0.5 gram vaginally twice weekly. 42.5 g 2   fexofenadine (ALLEGRA) 180 MG tablet Take 180 mg by mouth daily.     inFLIXimab (REMICADE) 100 MG injection Inject 200 mg into the vein as needed. Every 8-10 weeks     LORazepam (ATIVAN) 0.5 MG tablet Take 0.5 mg by mouth at bedtime.     Lysine 1000 MG TABS Take by mouth.     MAGNESIUM PO Take by mouth.     MANGANESE PO Take 3 mg by mouth.     NON FORMULARY Marine collagen     Omega-3 Fatty Acids (FISH OIL PO) Take 2 capsules by mouth daily.     predniSONE (DELTASONE) 20 MG tablet Take 20 mg by mouth daily with breakfast. 1 tablet before and after treatment     tacrolimus (PROTOPIC) 0.1 % ointment Apply topically as needed.     triamcinolone cream (KENALOG) 0.1 % Apply 1 application topically 2 (two) times daily as needed.     UNABLE TO FIND as  needed. Med Name: azaleic acid 15%, metronidazole 1%, Ivermectin 1% cream at night     valACYclovir (VALTREX) 1000 MG tablet Take 1 tablet (1,000 mg total) by mouth 2 (two) times daily. (Patient taking differently: Take 1,000 mg by mouth as needed.) 10 tablet 3   VITAMIN D PO Take 2,200 Int'l Units by mouth.     VITAMIN K PO Take by mouth.     Zinc Sulfate (ZINC 15 PO) Take by mouth.     No current facility-administered medications on file prior to visit.    Allergies  Allergen Reactions   Humira [Adalimumab] Rash    Raised persistent rash around the injection site.   Diphenhydramine     Other Reaction(s): dizziness   Doxepin Hcl     Other Reaction(s): dizziness/nightmares/stomach pain   Other     Benadryl 50mg IV Dizzy, rapid heart rate   Band-aid-redness, rash   Solu-Medrol [Methylprednisolone Sodium Succ]     Flush of face, chest, stomach   Methylprednisolone Rash   Remicade [Infliximab] Hives, Rash and Other (See Comments)    Tightening of chest. Per patient pre treatment requires  steroids, benadryl and a low dose of remicade.   Wound Dressing Adhesive Rash    Band Aid & some tapes      PE Today's Vitals   11/06/22 1155  BP: 122/68  Pulse: 78  SpO2: 100%  Weight: 111 lb 3.2 oz (50.4 kg)  Height: 5' 0.75" (1.543 m)   Body mass index is 21.18 kg/m.  Physical Exam Vitals reviewed.  Constitutional:      General: She is not in acute distress.    Appearance: Normal appearance.  HENT:     Head: Normocephalic and atraumatic.     Nose: Nose normal.  Eyes:     Extraocular Movements: Extraocular movements intact.     Conjunctiva/sclera: Conjunctivae normal.  Pulmonary:     Effort: Pulmonary effort is normal.  Musculoskeletal:        General: Normal range of motion.     Cervical back: Normal range of motion.  Neurological:     General: No focal deficit present.     Mental Status: She is alert.  Psychiatric:        Mood and Affect: Mood normal.        Behavior: Behavior normal.       Assessment and Plan:        Incontinence in female Assessment & Plan: Patient with OAB symptoms. Education on cause provided. Discussed lifestyle modifications for management, including avoidance of fluid prior to bed and bladder irritants. Also discussion PFPT and medical managements. Patient desires PFPT. Referral placed. [facility]GS pamphlets for bladder training and OAB provided. F/u in 3 months.  Orders: -     Urinalysis,Complete w/RFL Culture -     Ambulatory referral to Physical Therapy     Rosalyn Gess, MD

## 2022-11-21 ENCOUNTER — Other Ambulatory Visit: Payer: Self-pay

## 2022-11-21 ENCOUNTER — Ambulatory Visit: Payer: Medicare Other | Attending: Obstetrics and Gynecology | Admitting: Physical Therapy

## 2022-11-21 DIAGNOSIS — R279 Unspecified lack of coordination: Secondary | ICD-10-CM | POA: Diagnosis not present

## 2022-11-21 DIAGNOSIS — M6281 Muscle weakness (generalized): Secondary | ICD-10-CM | POA: Insufficient documentation

## 2022-11-21 DIAGNOSIS — R293 Abnormal posture: Secondary | ICD-10-CM | POA: Insufficient documentation

## 2022-11-21 NOTE — Therapy (Signed)
OUTPATIENT PHYSICAL THERAPY FEMALE PELVIC EVALUATION   Patient Name: Cassandra Herring MRN: [id number] DOB:12/03/1945, 77 y.o., female Today's Date: 11/21/2022  END OF SESSION:  PT End of Session - 11/21/22 1534     Visit Number 1    Date for PT Re-Evaluation 03/24/23    Authorization Type Medicare    PT Start Time 1530    PT Stop Time 1620    PT Time Calculation (min) 50 min    Activity Tolerance Patient tolerated treatment well    Behavior During Therapy WFL for tasks assessed/performed             Past Medical History:  Diagnosis Date   Anemia    Basal cell carcinoma    Bursitis    left shoulder    Crohn disease (HCC)    HNP (herniated nucleus pulposus)    Mass    gluteal   Osteopenia    Partial tear of left subscapularis tendon    STD (sexually transmitted disease)    Hx of genital warts   Tendon tear    shoulder   Thyroid nodule    Vertigo    Vitamin D deficiency    Past Surgical History:  Procedure Laterality Date   BREAST SURGERY     implants-1981, removal-2003   MOHS SURGERY Left 10/27/2020   left side of nose   Patient Active Problem List   Diagnosis Date Noted   Incontinence in female 11/06/2022   Drug reaction 05/01/2020   Crohn's disease with complication (HCC) 09/29/2018   Female climacteric state 11/25/2017   Osteopenia of left hip 11/25/2017   Rosacea 11/24/2017   Thyroid nodule 06/27/2015   Mass 09/27/2011    PCP: Leodis Sias, MD  REFERRING PROVIDER: Rosalyn Gess, MD   REFERRING DIAG: R32 (ICD-10-CM) - Incontinence in female  THERAPY DIAG:  Muscle weakness (generalized)  Abnormal posture  Unspecified lack of coordination  Rationale for Evaluation and Treatment: Rehabilitation  ONSET DATE: ~ 1 month  SUBJECTIVE:                                                                                                                                                                                           SUBJECTIVE  STATEMENT: Has urinary incontinence with urgency and wont make it often and has sudden urgency and then unable to make it. Feels like this is getting worse.  Reports prolapse urethra per pt.  Does report fecal leakage with Chrohn's  Fluid intake: Yes: 60 oz per day of fluid (water and green tea in morning, makes a juice with vegetable/fruit    PAIN:  Are  you having pain? Yes NPRS scale: 6/10 Pain location:  low back   Pain type: aching Pain description: intermittent   Aggravating factors: with crohns medication, unsure if weakness of stomach or a side effect of meds, (only in the morning and could be bed) Relieving factors: stretching, walking  PRECAUTIONS: None  RED FLAGS: None   WEIGHT BEARING RESTRICTIONS: No  FALLS:  Has patient fallen in last 6 months? No  LIVING ENVIRONMENT: Lives with: lives with their family Lives in: House/apartment   OCCUPATION: retired  PLOF: Independent  PATIENT GOALS: to improve prolapse, decreased urinary leakage, and fecal leakage  PERTINENT HISTORY:  Crohn's disease, was on HRT for over 20 years, was on estradiol cream, Osteopenia,   Sexual abuse: No  BOWEL MOVEMENT: Pain with bowel movement: No Type of bowel movement:Type (Bristol Stool Scale) 5-6, Frequency 4-5x daily, and Strain No Fully empty rectum: Yes: but sometimes feels like she has to go again after up and walking around Leakage: Yes: with looser stool Pads: Yes: cotton ball placed there sometimes Fiber supplement: No  URINATION: Pain with urination: No Fully empty bladder: Yes: feels like she needs to rock to fully empty Stream: Strong Urgency: No Frequency: sometimes in the morning around every 45 min to hour; then 2 hours Leakage: Urge to void and Walking to the bathroom Pads: No  INTERCOURSE: Pain with intercourse:  not painful, does have dryness uses a coconut oil Ability to have vaginal penetration:  Yes:   Climax: not painful Marinoff Scale:  0/3  PREGNANCY: Vaginal deliveries 0 Tearing No C-section deliveries 0 Currently pregnant No  PROLAPSE: None   OBJECTIVE:  Note: Objective measures were completed at Evaluation unless otherwise noted.  DIAGNOSTIC FINDINGS:    COGNITION: Overall cognitive status: Within functional limits for tasks assessed     SENSATION: Light touch: Appears intact Proprioception: Appears intact  MUSCLE LENGTH: Bil hamstrings and adductors limited by 25%  LUMBAR SPECIAL TESTS:  WFL  FUNCTIONAL TESTS:  WFL  GAIT: WFL  POSTURE: rounded shoulders  PELVIC ALIGNMENT:WFL  LUMBARAROM/PROM:  A/PROM A/PROM  eval  Flexion WFL  Extension WFL  Right lateral flexion Limited by 25%  Left lateral flexion Limited by 25%  Right rotation Limited by 25%  Left rotation Limited by 25%   (Blank rows = not tested)  LOWER EXTREMITY ROM:  WFL  LOWER EXTREMITY MMT:  Bil hips grossly 4/5; knees 5/5  PALPATION:   General  mild fascial restrictions in lower abdomen no TTP                External Perineal Exam [facility]                             Internal Pelvic Floor [facility]  Patient confirms identification and approves PT to assess internal pelvic floor and treatment Yes No emotional/communication barriers or cognitive limitation. Patient is motivated to learn. Patient understands and agrees with treatment goals and plan. PT explains patient will be examined in standing, sitting, and lying down to see how their muscles and joints work. When they are ready, they will be asked to remove their underwear so PT can examine their perineum. The patient is also given the option of providing their own chaperone as one is not provided in our facility. The patient also has the right and is explained the right to defer or refuse any part of the evaluation or treatment including the internal exam. With the  patient's consent, PT will use one gloved finger to gently assess the muscles of the pelvic floor, seeing how  well it contracts and relaxes and if there is muscle symmetry. After, the patient will get dressed and PT and patient will discuss exam findings and plan of care. PT and patient discuss plan of care, schedule, attendance policy and HEP activities.  PELVIC MMT:   MMT eval  Vaginal 4/5; 7 reps, 8s  Internal Anal Sphincter   External Anal Sphincter   Puborectalis   Diastasis Recti   (Blank rows = not tested)        TONE: WFL  PROLAPSE: Possible grade 2 anterior vaginal wall laxity with cough in hooklying   TODAY'S TREATMENT:                                                                                                                              DATE:   EVAL Examination completed, findings reviewed, pt educated on POC, HEP. Pt motivated to participate in PT and agreeable to attempt recommendations.     PATIENT EDUCATION:  Education details: [id number] Person educated: Patient Education method: Explanation, Demonstration, Tactile cues, Verbal cues, and Handouts Education comprehension: verbalized understanding and returned demonstration  HOME EXERCISE PROGRAM: [id number]  ASSESSMENT:  CLINICAL IMPRESSION: Patient is a 77 y.o. female  who was seen today for physical therapy evaluation and treatment for urinary incontinence and fecal incontinence and prolapse. Pt states she has been on HRT previously and now on estradiol, has not had children and does have crohn's. With her crohn's has had fecal leakage sometimes and does have frequent bowel movements daily usually type 5-6 on Bristol Stool scale. Pt reports mostly leakage of urine is with urgency and not getting to bathroom quickly enough. Pt found to have decreased flexibility in spine and hips, decreased strength in core and hips, and fascial restrictions in lower abdomen but without pain. Patient consented to internal pelvic floor assessment vaginally this date and found to have decreased strength, endurance, and coordination mildly  and possible grade 2 anterior wall laxity. Pt would benefit from additional PT to further address deficits.    OBJECTIVE IMPAIRMENTS: decreased activity tolerance, decreased coordination, decreased endurance, decreased strength, increased fascial restrictions, impaired flexibility, improper body mechanics, and postural dysfunction.   ACTIVITY LIMITATIONS: continence  PARTICIPATION LIMITATIONS: community activity  PERSONAL FACTORS: Time since onset of injury/illness/exacerbation , crohns are also affecting patient's functional outcome.   REHAB POTENTIAL: Good  CLINICAL DECISION MAKING: Stable/uncomplicated  EVALUATION COMPLEXITY: Low   GOALS: Goals reviewed with patient? Yes  SHORT TERM GOALS: Target date: 12/18/22  Pt to be I with HEP.  Baseline: Goal status: INITIAL  2.  Pt will have 25% less urgency due to bladder retraining and strengthening  Baseline:  Goal status: INITIAL  3.  Pt to be I with urge drill for decreased urinary leakage  Baseline:  Goal status: INITIAL  4.  Pt to be I with  voiding mechanics and abdominal massage for improved bowel habits.  Baseline:  Goal status: INITIAL   LONG TERM GOALS: Target date: 03/24/23  Pt to be I with advanced HEP.  Baseline:  Goal status: INITIAL  2.  Pt will have 50% less urgency due to bladder retraining and strengthening  Baseline:  Goal status: INITIAL  3.  Pt to report improved time between bladder voids to at least 2.5 hours for improved QOL with decreased urinary frequency.   Baseline:  Goal status: INITIAL  4.  Pt will report her bowel movements are complete due to improved bowel habits and evacuation techniques at least 75% of the time.  Baseline:  Goal status: INITIAL  5.  Pt will be able to functional actions such as walking for 30 mins without leakage  Baseline:  Goal status: INITIAL  6. Pt to be I with pressure management with functional activities such as squatting with improved management of  intraabdominal pressure for  decreased strain at pelvic floor.  Baseline:  Goal status: INITIAL PLAN:  PT FREQUENCY: every other week  PT DURATION:  8 sessions  PLANNED INTERVENTIONS: 97110-Therapeutic exercises, 97530- Therapeutic activity, 97112- Neuromuscular re-education, 97535- Self Care, 16109- Manual therapy, [phone]- Aquatic Therapy, Patient/Family education, Taping, Dry Needling, Joint mobilization, Spinal mobilization, Scar mobilization, Cryotherapy, Moist heat, and Biofeedback  PLAN FOR NEXT SESSION: pressure management, urge drill, voiding mechanics, breathing mechanics, coordination of pelvic floor and breathing and with activity, abdominal massage   Barbaraann Faster, PT 11/21/2022, 4:37 PM

## 2022-12-13 ENCOUNTER — Ambulatory Visit (HOSPITAL_COMMUNITY)
Admission: RE | Admit: 2022-12-13 | Discharge: 2022-12-13 | Disposition: A | Discharge status: Home / Self Care | Payer: Medicare Other | Source: Ambulatory Visit | Attending: Gastroenterology | Admitting: Gastroenterology

## 2022-12-13 DIAGNOSIS — K5090 Crohn's disease, unspecified, without complications: Secondary | ICD-10-CM | POA: Insufficient documentation

## 2022-12-13 MED ORDER — SODIUM CHLORIDE 0.9 % IV SOLN
200.0000 mg | INTRAVENOUS | Status: DC
  Administered 2022-12-13: 200 mg via INTRAVENOUS
  Filled 2022-12-13: qty 20

## 2022-12-13 MED ORDER — SODIUM CHLORIDE 0.9 % IV SOLN: INTRAVENOUS | Status: DC

## 2023-02-06 ENCOUNTER — Ambulatory Visit: Payer: Medicare Other | Admitting: Obstetrics and Gynecology

## 2023-02-06 ENCOUNTER — Ambulatory Visit: Payer: Medicare Other | Admitting: Physical Therapy

## 2023-02-12 ENCOUNTER — Ambulatory Visit (HOSPITAL_COMMUNITY)
Admission: RE | Admit: 2023-02-12 | Discharge: 2023-02-12 | Disposition: A | Discharge status: Home / Self Care | Payer: Medicare Other | Source: Ambulatory Visit | Attending: Gastroenterology | Admitting: Gastroenterology

## 2023-02-12 DIAGNOSIS — K5090 Crohn's disease, unspecified, without complications: Secondary | ICD-10-CM | POA: Insufficient documentation

## 2023-02-12 MED ORDER — SODIUM CHLORIDE 0.9 % IV SOLN
200.0000 mg | INTRAVENOUS | Status: DC
  Administered 2023-02-12: 200 mg via INTRAVENOUS
  Filled 2023-02-12: qty 20

## 2023-02-12 MED ORDER — SODIUM CHLORIDE 0.9 % IV SOLN: INTRAVENOUS | Status: DC

## 2023-02-17 ENCOUNTER — Ambulatory Visit: Payer: Medicare Other | Attending: Obstetrics and Gynecology | Admitting: Physical Therapy

## 2023-02-17 DIAGNOSIS — M6281 Muscle weakness (generalized): Secondary | ICD-10-CM | POA: Insufficient documentation

## 2023-02-17 DIAGNOSIS — R279 Unspecified lack of coordination: Secondary | ICD-10-CM | POA: Insufficient documentation

## 2023-02-17 NOTE — Therapy (Signed)
OUTPATIENT PHYSICAL THERAPY FEMALE PELVIC EVALUATION   Patient Name: Cassandra Herring MRN: [id number] DOB:16-Oct-1945, 78 y.o., female Today's Date: 02/17/2023  END OF SESSION:  PT End of Session - 02/17/23 1443     Visit Number 2    Date for PT Re-Evaluation 03/24/23    Authorization Type Medicare    PT Start Time 1400    PT Stop Time 1440    PT Time Calculation (min) 40 min    Activity Tolerance Patient tolerated treatment well    Behavior During Therapy WFL for tasks assessed/performed              Past Medical History:  Diagnosis Date   Anemia    Basal cell carcinoma    Bursitis    left shoulder    Crohn disease (HCC)    HNP (herniated nucleus pulposus)    Mass    gluteal   Osteopenia    Partial tear of left subscapularis tendon    STD (sexually transmitted disease)    Hx of genital warts   Tendon tear    shoulder   Thyroid nodule    Vertigo    Vitamin D deficiency    Past Surgical History:  Procedure Laterality Date   BREAST SURGERY     implants-1981, removal-2003   MOHS SURGERY Left 10/27/2020   left side of nose   Patient Active Problem List   Diagnosis Date Noted   Incontinence in female 11/06/2022   Drug reaction 05/01/2020   Crohn's disease with complication (HCC) 09/29/2018   Female climacteric state 11/25/2017   Osteopenia of left hip 11/25/2017   Rosacea 11/24/2017   Thyroid nodule 06/27/2015   Mass 09/27/2011    PCP: Leodis Sias, MD  REFERRING PROVIDER: Rosalyn Gess, MD   REFERRING DIAG: R32 (ICD-10-CM) - Incontinence in female  THERAPY DIAG:  Muscle weakness (generalized)  Unspecified lack of coordination  Rationale for Evaluation and Treatment: Rehabilitation  ONSET DATE: ~ 1 month  SUBJECTIVE:                                                                                                                                                                                           SUBJECTIVE STATEMENT: Pt reports  she feels 70-80% better. She has been making it to the bathroom often and feeling much better.  Fluid intake: Yes: 60 oz per day of fluid (water and green tea in morning, makes a juice with vegetable/fruit    PAIN: 02/17/23 not today Are you having pain? Yes NPRS scale: 6/10 Pain location:  low back   Pain type: aching Pain description: intermittent  Aggravating factors: with crohns medication, unsure if weakness of stomach or a side effect of meds, (only in the morning and could be bed) Relieving factors: stretching, walking  PRECAUTIONS: None  RED FLAGS: None   WEIGHT BEARING RESTRICTIONS: No  FALLS:  Has patient fallen in last 6 months? No  LIVING ENVIRONMENT: Lives with: lives with their family Lives in: House/apartment   OCCUPATION: retired  PLOF: Independent  PATIENT GOALS: to improve prolapse, decreased urinary leakage, and fecal leakage  PERTINENT HISTORY:  Crohn's disease, was on HRT for over 20 years, was on estradiol cream, Osteopenia,   Sexual abuse: No  BOWEL MOVEMENT: Pain with bowel movement: No Type of bowel movement:Type (Bristol Stool Scale) 5-6, Frequency 4-5x daily, and Strain No Fully empty rectum: Yes: but sometimes feels like she has to go again after up and walking around Leakage: Yes: with looser stool Pads: Yes: cotton ball placed there sometimes Fiber supplement: No  URINATION: Pain with urination: No Fully empty bladder: Yes: feels like she needs to rock to fully empty Stream: Strong Urgency: No Frequency: sometimes in the morning around every 45 min to hour; then 2 hours Leakage: Urge to void and Walking to the bathroom Pads: No  INTERCOURSE: Pain with intercourse:  not painful, does have dryness uses a coconut oil Ability to have vaginal penetration:  Yes:   Climax: not painful Marinoff Scale: 0/3  PREGNANCY: Vaginal deliveries 0 Tearing No C-section deliveries 0 Currently pregnant No  PROLAPSE: None   OBJECTIVE:   Note: Objective measures were completed at Evaluation unless otherwise noted.  DIAGNOSTIC FINDINGS:    COGNITION: Overall cognitive status: Within functional limits for tasks assessed     SENSATION: Light touch: Appears intact Proprioception: Appears intact  MUSCLE LENGTH: Bil hamstrings and adductors limited by 25%  LUMBAR SPECIAL TESTS:  WFL  FUNCTIONAL TESTS:  WFL  GAIT: WFL  POSTURE: rounded shoulders  PELVIC ALIGNMENT:WFL  LUMBARAROM/PROM:  A/PROM A/PROM  eval  Flexion WFL  Extension WFL  Right lateral flexion Limited by 25%  Left lateral flexion Limited by 25%  Right rotation Limited by 25%  Left rotation Limited by 25%   (Blank rows = not tested)  LOWER EXTREMITY ROM:  WFL  LOWER EXTREMITY MMT:  Bil hips grossly 4/5; knees 5/5  PALPATION:   General  mild fascial restrictions in lower abdomen no TTP                External Perineal Exam [facility]                             Internal Pelvic Floor [facility]  Patient confirms identification and approves PT to assess internal pelvic floor and treatment Yes No emotional/communication barriers or cognitive limitation. Patient is motivated to learn. Patient understands and agrees with treatment goals and plan. PT explains patient will be examined in standing, sitting, and lying down to see how their muscles and joints work. When they are ready, they will be asked to remove their underwear so PT can examine their perineum. The patient is also given the option of providing their own chaperone as one is not provided in our facility. The patient also has the right and is explained the right to defer or refuse any part of the evaluation or treatment including the internal exam. With the patient's consent, PT will use one gloved finger to gently assess the muscles of the pelvic floor, seeing how well it contracts  and relaxes and if there is muscle symmetry. After, the patient will get dressed and PT and patient will discuss  exam findings and plan of care. PT and patient discuss plan of care, schedule, attendance policy and HEP activities.  PELVIC MMT:   MMT eval  Vaginal 4/5; 7 reps, 8s  Internal Anal Sphincter   External Anal Sphincter   Puborectalis   Diastasis Recti   (Blank rows = not tested)        TONE: WFL  PROLAPSE: Possible grade 2 anterior vaginal wall laxity with cough in hooklying   TODAY'S TREATMENT:                                                                                                                              DATE:   EVAL Examination completed, findings reviewed, pt educated on POC, HEP. Pt motivated to participate in PT and agreeable to attempt recommendations.    02/17/23: Pt educated on voiding mechanics for improved bowel evacuation, pt has stool leakage after having a bowel movement when she had gotten up and walked around. Then notes some stool emptying. Usually type 6-5. Pt educated on step stool and relaxation techniques to fully empty and decreased strain at pelvic floor as able to decreased fecal leakage.  Progressed HEP as well for increased frequency and reps of pelvic floor contractions and in various positions to improve pelvic floor strength with various positions to decreased leakage in supine/standing/sitting.   PATIENT EDUCATION:  Education details: [id number] Person educated: Patient Education method: Explanation, Demonstration, Tactile cues, Verbal cues, and Handouts Education comprehension: verbalized understanding and returned demonstration  HOME EXERCISE PROGRAM: [id number]  ASSESSMENT:  CLINICAL IMPRESSION: Patient presents for treatment, has seen good improvement so far with treatment. Pt tolerated session well and focus on decreased fecal leakage and continued pelvic floor strength. Pt would benefit from additional PT to further address deficits.    OBJECTIVE IMPAIRMENTS: decreased activity tolerance, decreased coordination, decreased endurance,  decreased strength, increased fascial restrictions, impaired flexibility, improper body mechanics, and postural dysfunction.   ACTIVITY LIMITATIONS: continence  PARTICIPATION LIMITATIONS: community activity  PERSONAL FACTORS: Time since onset of injury/illness/exacerbation , crohns are also affecting patient's functional outcome.   REHAB POTENTIAL: Good  CLINICAL DECISION MAKING: Stable/uncomplicated  EVALUATION COMPLEXITY: Low   GOALS: Goals reviewed with patient? Yes  SHORT TERM GOALS: Target date: 12/18/22  Pt to be I with HEP.  Baseline: Goal status: INITIAL  2.  Pt will have 25% less urgency due to bladder retraining and strengthening  Baseline:  Goal status: INITIAL  3.  Pt to be I with urge drill for decreased urinary leakage  Baseline:  Goal status: INITIAL  4.  Pt to be I with voiding mechanics and abdominal massage for improved bowel habits.  Baseline:  Goal status: INITIAL   LONG TERM GOALS: Target date: 03/24/23  Pt to be I with advanced HEP.  Baseline:  Goal status: INITIAL  2.  Pt will have 50% less urgency due to bladder retraining and strengthening  Baseline:  Goal status: INITIAL  3.  Pt to report improved time between bladder voids to at least 2.5 hours for improved QOL with decreased urinary frequency.   Baseline:  Goal status: INITIAL  4.  Pt will report her bowel movements are complete due to improved bowel habits and evacuation techniques at least 75% of the time.  Baseline:  Goal status: INITIAL  5.  Pt will be able to functional actions such as walking for 30 mins without leakage  Baseline:  Goal status: INITIAL  6. Pt to be I with pressure management with functional activities such as squatting with improved management of intraabdominal pressure for  decreased strain at pelvic floor.  Baseline:  Goal status: INITIAL PLAN:  PT FREQUENCY: every other week  PT DURATION:  8 sessions  PLANNED INTERVENTIONS: 97110-Therapeutic  exercises, 97530- Therapeutic activity, 97112- Neuromuscular re-education, 97535- Self Care, [id number]- Manual therapy, [phone]- Aquatic Therapy, Patient/Family education, Taping, Dry Needling, Joint mobilization, Spinal mobilization, Scar mobilization, Cryotherapy, Moist heat, and Biofeedback  PLAN FOR NEXT SESSION: pressure management, urge drill, voiding mechanics, breathing mechanics, coordination of pelvic floor and breathing and with activity, abdominal massage   Otelia Sergeant, PT, DPT 01/20/252:43 PM

## 2023-02-17 NOTE — Patient Instructions (Signed)
 Cassandra Herring

## 2023-02-24 ENCOUNTER — Encounter: Payer: Medicare Other | Admitting: Physical Therapy

## 2023-03-03 ENCOUNTER — Ambulatory Visit: Payer: Medicare Other | Attending: Obstetrics and Gynecology | Admitting: Physical Therapy

## 2023-03-03 DIAGNOSIS — R279 Unspecified lack of coordination: Secondary | ICD-10-CM | POA: Diagnosis not present

## 2023-03-03 DIAGNOSIS — R293 Abnormal posture: Secondary | ICD-10-CM | POA: Diagnosis not present

## 2023-03-03 DIAGNOSIS — M6281 Muscle weakness (generalized): Secondary | ICD-10-CM | POA: Insufficient documentation

## 2023-03-03 NOTE — Patient Instructions (Addendum)
Urge Incontinence  Ideal urination frequency is every 2-4 wakeful hours, which equates to 5-8 times within a 24-hour period.   Urge incontinence is leakage that occurs when the bladder muscle contracts, creating a sudden need to go before getting to the bathroom.   Going too often when your bladder isn't actually full can disrupt the body's automatic signals to store and hold urine longer, which will increase urgency/frequency.  In this case, the bladder "is running the show" and strategies can be learned to retrain this pattern.   One should be able to control the first urge to urinate, at around .  The bladder can hold up to a "grande latte," or . To help you gain control, practice the Urge Drill below when urgency strikes.  This drill will help retrain your bladder signals and allow you to store and hold urine longer.  The overall goal is to stretch out your time between voids to reach a more manageable voiding schedule.    Practice your "quick flicks" often throughout the day (each waking hour) even when you don't need feel the urge to go.  This will help strengthen your pelvic floor muscles, making them more effective in controlling leakage.  Urge Drill  When you feel an urge to go, follow these steps to regain control: Stop what you are doing and be still Take one deep breath, directing your air into your abdomen Think an affirming thought, such as "I've got this." Do 5 quick flicks of your pelvic floor Walk with control to the bathroom to void, or delay voiding   Bladder Irritants  Certain foods and beverages can be irritating to the bladder.  Avoiding these irritants may decrease your symptoms of urinary urgency, frequency or bladder pain.  Even reducing your intake can help with your symptoms.  Not everyone is sensitive to all bladder irritants, so you may consider focusing on one irritant at a time, removing or reducing your intake of that irritant for 7-10 days to see if  this change helps your symptoms.  Water intake is also very important.  Below is a list of bladder irritants.  Drinks: alcohol, carbonated beverages, caffeinated beverages such as coffee and tea, drinks with artificial sweeteners, citrus juices, apple juice, tomato juice  Foods: tomatoes and tomato based foods, spicy food, sugar and artificial sweeteners, vinegar, chocolate, raw onion, apples, citrus fruits, pineapple, cranberries, tomatoes, strawberries, plums, peaches, cantaloupe  Other: acidic urine (too concentrated) - see water intake info below  Substitutes you can try that are NOT irritating to the bladder: cooked onion, pears, papayas, sun-brewed decaf teas, watermelons, non-citrus herbal teas, apricots, kava and low-acid instant drinks (Postum).    WATER INTAKE: Remember to drink lots of water (aim for fluid intake of half your body weight with 2/3 of fluids being water).  You may be limiting fluids due to fear of leakage, but this can actually worsen urgency symptoms due to highly concentrated urine.  Water helps balance the pH of your urine so it doesn't become too acidic - acidic urine is a bladder irritant!   15-20 mins in the evenings based on tolerance.  Please clear any positions with doctor as needed.  Please stop any position if negative symptoms occur (dizziness/lightheadedness/fatigue/increased pain, etc.)

## 2023-03-03 NOTE — Therapy (Signed)
OUTPATIENT PHYSICAL THERAPY FEMALE PELVIC EVALUATION   Patient Name: Cassandra Herring MRN: [id number] DOB:1945-07-16, 78 y.o., female Today's Date: 03/03/2023  END OF SESSION:  PT End of Session - 03/03/23 1404     Visit Number 3    Date for PT Re-Evaluation 03/24/23    Authorization Type Medicare    PT Start Time 1401    PT Stop Time 1443    PT Time Calculation (min) 42 min    Activity Tolerance Patient tolerated treatment well    Behavior During Therapy WFL for tasks assessed/performed              Past Medical History:  Diagnosis Date   Anemia    Basal cell carcinoma    Bursitis    left shoulder    Crohn disease (HCC)    HNP (herniated nucleus pulposus)    Mass    gluteal   Osteopenia    Partial tear of left subscapularis tendon    STD (sexually transmitted disease)    Hx of genital warts   Tendon tear    shoulder   Thyroid nodule    Vertigo    Vitamin D deficiency    Past Surgical History:  Procedure Laterality Date   BREAST SURGERY     implants-1981, removal-2003   MOHS SURGERY Left 10/27/2020   left side of nose   Patient Active Problem List   Diagnosis Date Noted   Incontinence in female 11/06/2022   Drug reaction 05/01/2020   Crohn's disease with complication (HCC) 09/29/2018   Female climacteric state 11/25/2017   Osteopenia of left hip 11/25/2017   Rosacea 11/24/2017   Thyroid nodule 06/27/2015   Mass 09/27/2011    PCP: Leodis Sias, MD  REFERRING PROVIDER: Rosalyn Gess, MD   REFERRING DIAG: R32 (ICD-10-CM) - Incontinence in female  THERAPY DIAG:  Muscle weakness (generalized)  Unspecified lack of coordination  Abnormal posture  Rationale for Evaluation and Treatment: Rehabilitation  ONSET DATE: ~ 1 month  SUBJECTIVE:                                                                                                                                                                                           SUBJECTIVE  STATEMENT: Pt reports overall she still feeling like she is getting better but did have one instance of full loss of bladder. Has been elevating legs for bowel movement and does report this is helping fully emptying and lessen time sitting. Does still have a little residual stool sometimes but not always  Fluid intake: Yes: 60 oz per day of fluid (water and green tea  in morning, makes a juice with vegetable/fruit    PAIN: 03/03/23  not today  Are you having pain? Yes NPRS scale: 6/10 Pain location:  low back   Pain type: aching Pain description: intermittent   Aggravating factors: with crohns medication, unsure if weakness of stomach or a side effect of meds, (only in the morning and could be bed) Relieving factors: stretching, walking  PRECAUTIONS: None  RED FLAGS: None   WEIGHT BEARING RESTRICTIONS: No  FALLS:  Has patient fallen in last 6 months? No  LIVING ENVIRONMENT: Lives with: lives with their family Lives in: House/apartment   OCCUPATION: retired  PLOF: Independent  PATIENT GOALS: to improve prolapse, decreased urinary leakage, and fecal leakage  PERTINENT HISTORY:  Crohn's disease, was on HRT for over 20 years, was on estradiol cream, Osteopenia,   Sexual abuse: No  BOWEL MOVEMENT: Pain with bowel movement: No Type of bowel movement:Type (Bristol Stool Scale) 5-6, Frequency 4-5x daily, and Strain No Fully empty rectum: Yes: but sometimes feels like she has to go again after up and walking around Leakage: Yes: with looser stool Pads: Yes: cotton ball placed there sometimes Fiber supplement: No  URINATION: Pain with urination: No Fully empty bladder: Yes: feels like she needs to rock to fully empty Stream: Strong Urgency: No Frequency: sometimes in the morning around every 45 min to hour; then 2 hours Leakage: Urge to void and Walking to the bathroom Pads: No  INTERCOURSE: Pain with intercourse:  not painful, does have dryness uses a coconut  oil Ability to have vaginal penetration:  Yes:   Climax: not painful Marinoff Scale: 0/3  PREGNANCY: Vaginal deliveries 0 Tearing No C-section deliveries 0 Currently pregnant No  PROLAPSE: None   OBJECTIVE:  Note: Objective measures were completed at Evaluation unless otherwise noted.  DIAGNOSTIC FINDINGS:    COGNITION: Overall cognitive status: Within functional limits for tasks assessed     SENSATION: Light touch: Appears intact Proprioception: Appears intact  MUSCLE LENGTH: Bil hamstrings and adductors limited by 25%  LUMBAR SPECIAL TESTS:  WFL  FUNCTIONAL TESTS:  WFL  GAIT: WFL  POSTURE: rounded shoulders  PELVIC ALIGNMENT:WFL  LUMBARAROM/PROM:  A/PROM A/PROM  eval  Flexion WFL  Extension WFL  Right lateral flexion Limited by 25%  Left lateral flexion Limited by 25%  Right rotation Limited by 25%  Left rotation Limited by 25%   (Blank rows = not tested)  LOWER EXTREMITY ROM:  WFL  LOWER EXTREMITY MMT:  Bil hips grossly 4/5; knees 5/5  PALPATION:   General  mild fascial restrictions in lower abdomen no TTP                External Perineal Exam [facility]                             Internal Pelvic Floor [facility]  Patient confirms identification and approves PT to assess internal pelvic floor and treatment Yes No emotional/communication barriers or cognitive limitation. Patient is motivated to learn. Patient understands and agrees with treatment goals and plan. PT explains patient will be examined in standing, sitting, and lying down to see how their muscles and joints work. When they are ready, they will be asked to remove their underwear so PT can examine their perineum. The patient is also given the option of providing their own chaperone as one is not provided in our facility. The patient also has the right and is explained the right  to defer or refuse any part of the evaluation or treatment including the internal exam. With the patient's consent, PT  will use one gloved finger to gently assess the muscles of the pelvic floor, seeing how well it contracts and relaxes and if there is muscle symmetry. After, the patient will get dressed and PT and patient will discuss exam findings and plan of care. PT and patient discuss plan of care, schedule, attendance policy and HEP activities.  PELVIC MMT:   MMT eval  Vaginal 4/5; 7 reps, 8s  Internal Anal Sphincter   External Anal Sphincter   Puborectalis   Diastasis Recti   (Blank rows = not tested)        TONE: WFL  PROLAPSE: Possible grade 2 anterior vaginal wall laxity with cough in hooklying   TODAY'S TREATMENT:                                                                                                                              DATE:   EVAL Examination completed, findings reviewed, pt educated on POC, HEP. Pt motivated to participate in PT and agreeable to attempt recommendations.    02/17/23: Pt educated on voiding mechanics for improved bowel evacuation, pt has stool leakage after having a bowel movement when she had gotten up and walked around. Then notes some stool emptying. Usually type 6-5. Pt educated on step stool and relaxation techniques to fully empty and decreased strain at pelvic floor as able to decreased fecal leakage.  Progressed HEP as well for increased frequency and reps of pelvic floor contractions and in various positions to improve pelvic floor strength with various positions to decreased leakage in supine/standing/sitting.   03/03/23: Pt given handouts and reviewed with pt on urge drill and bladder irritants and prolapse relief positions Pt had several questions about these and all reviewed but required extra time during session.  Pt encouraged to continue HEP and recommendations and agreed   PATIENT EDUCATION:  Education details: [id number] Person educated: Patient Education method: Explanation, Demonstration, Tactile cues, Verbal cues, and  Handouts Education comprehension: verbalized understanding and returned demonstration  HOME EXERCISE PROGRAM: [id number]  ASSESSMENT:  CLINICAL IMPRESSION: Patient presents for treatment, has seen good improvement so far with treatment. Pt tolerated session well and focus on techniques to decreased urinary incontinence and frequency and decreased prolapse symptoms. Pt would benefit from additional PT to further address deficits.    OBJECTIVE IMPAIRMENTS: decreased activity tolerance, decreased coordination, decreased endurance, decreased strength, increased fascial restrictions, impaired flexibility, improper body mechanics, and postural dysfunction.   ACTIVITY LIMITATIONS: continence  PARTICIPATION LIMITATIONS: community activity  PERSONAL FACTORS: Time since onset of injury/illness/exacerbation , crohns are also affecting patient's functional outcome.   REHAB POTENTIAL: Good  CLINICAL DECISION MAKING: Stable/uncomplicated  EVALUATION COMPLEXITY: Low   GOALS: Goals reviewed with patient? Yes  SHORT TERM GOALS: Target date: 12/18/22  Pt to be I with HEP.  Baseline: Goal status: INITIAL  2.  Pt will have 25% less urgency due to bladder retraining and strengthening  Baseline:  Goal status: INITIAL  3.  Pt to be I with urge drill for decreased urinary leakage  Baseline:  Goal status: INITIAL  4.  Pt to be I with voiding mechanics and abdominal massage for improved bowel habits.  Baseline:  Goal status: INITIAL   LONG TERM GOALS: Target date: 03/24/23  Pt to be I with advanced HEP.  Baseline:  Goal status: INITIAL  2.  Pt will have 50% less urgency due to bladder retraining and strengthening  Baseline:  Goal status: INITIAL  3.  Pt to report improved time between bladder voids to at least 2.5 hours for improved QOL with decreased urinary frequency.   Baseline:  Goal status: INITIAL  4.  Pt will report her bowel movements are complete due to improved bowel habits  and evacuation techniques at least 75% of the time.  Baseline:  Goal status: INITIAL  5.  Pt will be able to functional actions such as walking for 30 mins without leakage  Baseline:  Goal status: INITIAL  6. Pt to be I with pressure management with functional activities such as squatting with improved management of intraabdominal pressure for  decreased strain at pelvic floor.  Baseline:  Goal status: INITIAL PLAN:  PT FREQUENCY: every other week  PT DURATION:  8 sessions  PLANNED INTERVENTIONS: 97110-Therapeutic exercises, 97530- Therapeutic activity, 97112- Neuromuscular re-education, 97535- Self Care, 16109- Manual therapy, [phone]- Aquatic Therapy, Patient/Family education, Taping, Dry Needling, Joint mobilization, Spinal mobilization, Scar mobilization, Cryotherapy, Moist heat, and Biofeedback  PLAN FOR NEXT SESSION: pressure management, urge drill, voiding mechanics, breathing mechanics, coordination of pelvic floor and breathing and with activity, abdominal massage   Otelia Sergeant, PT, DPT 02/03/253:07 PM

## 2023-03-26 DIAGNOSIS — Z09 Encounter for follow-up examination after completed treatment for conditions other than malignant neoplasm: Secondary | ICD-10-CM | POA: Diagnosis not present

## 2023-03-26 DIAGNOSIS — Z8719 Personal history of other diseases of the digestive system: Secondary | ICD-10-CM | POA: Diagnosis not present

## 2023-03-26 DIAGNOSIS — Z860101 Personal history of adenomatous and serrated colon polyps: Secondary | ICD-10-CM | POA: Diagnosis not present

## 2023-04-02 DIAGNOSIS — Z808 Family history of malignant neoplasm of other organs or systems: Secondary | ICD-10-CM | POA: Diagnosis not present

## 2023-04-02 DIAGNOSIS — Z85828 Personal history of other malignant neoplasm of skin: Secondary | ICD-10-CM | POA: Diagnosis not present

## 2023-04-02 DIAGNOSIS — L578 Other skin changes due to chronic exposure to nonionizing radiation: Secondary | ICD-10-CM | POA: Diagnosis not present

## 2023-04-02 DIAGNOSIS — L821 Other seborrheic keratosis: Secondary | ICD-10-CM | POA: Diagnosis not present

## 2023-04-02 DIAGNOSIS — D225 Melanocytic nevi of trunk: Secondary | ICD-10-CM | POA: Diagnosis not present

## 2023-04-02 DIAGNOSIS — L84 Corns and callosities: Secondary | ICD-10-CM | POA: Diagnosis not present

## 2023-04-02 DIAGNOSIS — L720 Epidermal cyst: Secondary | ICD-10-CM | POA: Diagnosis not present

## 2023-04-16 ENCOUNTER — Ambulatory Visit (HOSPITAL_COMMUNITY)
Admission: RE | Admit: 2023-04-16 | Discharge: 2023-04-16 | Disposition: A | Discharge status: Home / Self Care | Payer: Medicare Other | Source: Ambulatory Visit | Attending: Gastroenterology | Admitting: Gastroenterology

## 2023-04-16 DIAGNOSIS — K5090 Crohn's disease, unspecified, without complications: Secondary | ICD-10-CM | POA: Insufficient documentation

## 2023-04-16 MED ORDER — SODIUM CHLORIDE 0.9 % IV SOLN: INTRAVENOUS | Status: DC

## 2023-04-16 MED ORDER — SODIUM CHLORIDE 0.9 % IV SOLN
200.0000 mg | INTRAVENOUS | Status: DC
  Administered 2023-04-16: 200 mg via INTRAVENOUS
  Filled 2023-04-16: qty 20

## 2023-04-24 DIAGNOSIS — R739 Hyperglycemia, unspecified: Secondary | ICD-10-CM | POA: Diagnosis not present

## 2023-04-24 DIAGNOSIS — Z0000 Encounter for general adult medical examination without abnormal findings: Secondary | ICD-10-CM | POA: Diagnosis not present

## 2023-04-24 DIAGNOSIS — M8580 Other specified disorders of bone density and structure, unspecified site: Secondary | ICD-10-CM | POA: Diagnosis not present

## 2023-04-24 DIAGNOSIS — F5101 Primary insomnia: Secondary | ICD-10-CM | POA: Diagnosis not present

## 2023-04-24 DIAGNOSIS — E559 Vitamin D deficiency, unspecified: Secondary | ICD-10-CM | POA: Diagnosis not present

## 2023-04-24 DIAGNOSIS — E7800 Pure hypercholesterolemia, unspecified: Secondary | ICD-10-CM | POA: Diagnosis not present

## 2023-04-24 DIAGNOSIS — K5090 Crohn's disease, unspecified, without complications: Secondary | ICD-10-CM | POA: Diagnosis not present

## 2023-04-24 DIAGNOSIS — E871 Hypo-osmolality and hyponatremia: Secondary | ICD-10-CM | POA: Diagnosis not present

## 2023-04-24 DIAGNOSIS — Z6822 Body mass index (BMI) 22.0-22.9, adult: Secondary | ICD-10-CM | POA: Diagnosis not present

## 2023-04-29 ENCOUNTER — Encounter: Payer: Self-pay | Admitting: Obstetrics and Gynecology

## 2023-04-29 ENCOUNTER — Telehealth: Payer: Self-pay

## 2023-04-29 ENCOUNTER — Ambulatory Visit: Payer: Medicare Other | Admitting: Obstetrics and Gynecology

## 2023-04-29 ENCOUNTER — Ambulatory Visit (INDEPENDENT_AMBULATORY_CARE_PROVIDER_SITE_OTHER): Payer: Medicare Other | Admitting: Obstetrics and Gynecology

## 2023-04-29 VITALS — BP 104/60 | HR 75 | Temp 97.9°F | Ht 61.25 in | Wt 111.0 lb

## 2023-04-29 DIAGNOSIS — N952 Postmenopausal atrophic vaginitis: Secondary | ICD-10-CM

## 2023-04-29 DIAGNOSIS — Z9189 Other specified personal risk factors, not elsewhere classified: Secondary | ICD-10-CM | POA: Diagnosis not present

## 2023-04-29 DIAGNOSIS — A609 Anogenital herpesviral infection, unspecified: Secondary | ICD-10-CM

## 2023-04-29 DIAGNOSIS — N8110 Cystocele, unspecified: Secondary | ICD-10-CM | POA: Diagnosis not present

## 2023-04-29 DIAGNOSIS — Z1331 Encounter for screening for depression: Secondary | ICD-10-CM

## 2023-04-29 DIAGNOSIS — M85852 Other specified disorders of bone density and structure, left thigh: Secondary | ICD-10-CM

## 2023-04-29 DIAGNOSIS — Z01419 Encounter for gynecological examination (general) (routine) without abnormal findings: Secondary | ICD-10-CM | POA: Insufficient documentation

## 2023-04-29 DIAGNOSIS — R32 Unspecified urinary incontinence: Secondary | ICD-10-CM | POA: Diagnosis not present

## 2023-04-29 DIAGNOSIS — Z4689 Encounter for fitting and adjustment of other specified devices: Secondary | ICD-10-CM | POA: Insufficient documentation

## 2023-04-29 MED ORDER — ESTRADIOL 0.1 MG/GM VA CREA
TOPICAL_CREAM | VAGINAL | 2 refills | Status: AC
Start: 2023-04-29 — End: ?

## 2023-04-29 MED ORDER — VALACYCLOVIR HCL 1 G PO TABS
1000.0000 mg | ORAL_TABLET | ORAL | 3 refills | Status: AC | PRN
Start: 2023-04-29 — End: 2023-05-04

## 2023-04-29 NOTE — Assessment & Plan Note (Signed)
Continue PFPT

## 2023-04-29 NOTE — Assessment & Plan Note (Signed)
 Cervical cancer screening performed according to ASCCP guidelines. Encouraged annual mammogram screening Colonoscopy UTD DXA due Nov 2025. Ordered by PCP Labs and immunizations with her primary Encouraged safe sexual practices as indicated Encouraged healthy lifestyle practices with diet and exercise For patients over 76yo, I recommend 1200mg  calcium daily and 800IU of vitamin D daily.

## 2023-04-29 NOTE — Assessment & Plan Note (Signed)
 Continue PFPT Declined pessary at this time

## 2023-04-29 NOTE — Patient Instructions (Signed)

## 2023-04-29 NOTE — Telephone Encounter (Signed)
 Pt LVM in triage line stating that she heard from her pharmacy that two prescriptions were ready that she has no idea about and doesn't recall discussing any rxs in her visit. Inquiring if she has a yeast infection?   Per EMR investigation: rxs sent for estradiol cream and valtrex.   Pt also sent mychart msg, will forward that to provider as well.

## 2023-04-29 NOTE — Assessment & Plan Note (Signed)
 Continue vitamin D+Calcium Encouraged weight based exercise DXA due Nov 2025

## 2023-04-29 NOTE — Progress Notes (Signed)
 78 y.o. G0P0000 postmenopausal female with Crohn's osteopenia, GSM, OAB, vitamin D deficiency here for annual exam-high risk Medicare. Widowed.  H/O Silicone breast implants which ruptured and were removed.  Completed 3 PFPT sessions, some improvement, however awaiting call in 2/2 busy schedule  Postmenopausal bleeding: none Pelvic discharge or pain: none Breast mass, nipple discharge or skin changes : none Last PAP: No results found for: "DIAGPAP", "HPVHIGH", "ADEQPAP" Last mammogram: 10/22/2017, declines repeating a mammo because has too little breast tissue/scarring.  Last colonoscopy: 2023 Last DXA: 2023 per patient Sexually active: Yes Exercising: Yes, walk Smoker: No  GYN HISTORY: No significant history  OB History  Gravida Para Term Preterm AB Living  0 0 0 0 0 0  SAB IAB Ectopic Multiple Live Births  0 0 0 0     Past Medical History:  Diagnosis Date   Anemia    Basal cell carcinoma    Bursitis    left shoulder    Crohn disease (HCC)    HNP (herniated nucleus pulposus)    Mass    gluteal   Osteopenia    Partial tear of left subscapularis tendon    STD (sexually transmitted disease)    Hx of genital warts   Tendon tear    shoulder   Thyroid nodule    Vertigo    Vitamin D deficiency     Past Surgical History:  Procedure Laterality Date   BREAST SURGERY     implants-1981, removal-2003   MOHS SURGERY Left 10/27/2020   left side of nose    Current Outpatient Medications on File Prior to Visit  Medication Sig Dispense Refill   acetaminophen (TYLENOL) 500 MG tablet Take 500 mg by mouth as needed. 1 tab before remicaide treatment     alclomethasone (ACLOVATE) 0.05 % cream Apply 1 application topically 2 (two) times daily.     Ascorbic Acid (VITAMIN C) 1000 MG tablet Take 1,000 mg by mouth daily.     BORON PO Take by mouth.     CALCIUM PO Take by mouth daily.     Cetirizine HCl (ZYRTEC ALLERGY PO) Take by mouth as needed.     COPPER PO Take by mouth.      diphenhydrAMINE (BENADRYL) 50 MG capsule Take 50 mg by mouth every 6 (six) hours as needed. 1 tab before Remicade treatment     fexofenadine (ALLEGRA) 180 MG tablet Take 180 mg by mouth daily.     inFLIXimab (REMICADE) 100 MG injection Inject 200 mg into the vein as needed. Every 8-10 weeks     LORazepam (ATIVAN) 0.5 MG tablet Take 0.5 mg by mouth at bedtime.     Lysine 1000 MG TABS Take by mouth.     MAGNESIUM PO Take by mouth.     MANGANESE PO Take 3 mg by mouth.     NON FORMULARY Marine collagen     Omega-3 Fatty Acids (FISH OIL PO) Take 2 capsules by mouth daily.     predniSONE (DELTASONE) 20 MG tablet Take 20 mg by mouth daily with breakfast. 1 tablet before and after treatment     tacrolimus (PROTOPIC) 0.1 % ointment Apply topically as needed.     triamcinolone cream (KENALOG) 0.1 % Apply 1 application topically 2 (two) times daily as needed.     UNABLE TO FIND as needed. Med Name: azaleic acid 15%, metronidazole 1%, Ivermectin 1% cream at night     VITAMIN D PO Take 2,200 Int'l Units by mouth.  VITAMIN K PO Take by mouth.     Zinc Sulfate (ZINC 15 PO) Take by mouth.     No current facility-administered medications on file prior to visit.    Social History   Socioeconomic History   Marital status: Widowed    Spouse name: Not on file   Number of children: Not on file   Years of education: Not on file   Highest education level: Not on file  Occupational History   Not on file  Tobacco Use   Smoking status: Never   Smokeless tobacco: Never  Vaping Use   Vaping status: Never Used  Substance and Sexual Activity   Alcohol use: No    Alcohol/week: 0.0 standard drinks of alcohol   Drug use: No   Sexual activity: Yes    Partners: Male    Birth control/protection: Post-menopausal    Comment: 1st intercourse- 27, partners- more than 5, married- 30 yrs  Other Topics Concern   Not on file  Social History Narrative   Not on file   Social Drivers of Health   Financial  Resource Strain: Not on file  Food Insecurity: Not on file  Transportation Needs: Not on file  Physical Activity: Not on file  Stress: Not on file  Social Connections: Not on file  Intimate Partner Violence: Not on file    Family History  Problem Relation Age of Onset   Lymphoma Father        non-hodkins   Heart attack Father    Cancer Father        skin   Kidney disease Mother    Heart attack Mother    Emphysema Mother    Breast cancer Maternal Aunt     Allergies  Allergen Reactions   Humira [Adalimumab] Rash    Raised persistent rash around the injection site.   Diphenhydramine     Other Reaction(s): dizziness   Doxepin Hcl     Other Reaction(s): dizziness/nightmares/stomach pain   Other     Benadryl 50mg IV Dizzy, rapid heart rate   Band-aid-redness, rash   Solu-Medrol [Methylprednisolone Sodium Succ]     Flush of face, chest, stomach   Methylprednisolone Rash   Remicade [Infliximab] Hives, Rash and Other (See Comments)    Tightening of chest. Per patient pre treatment requires steroids, benadryl and a low dose of remicade.   Wound Dressing Adhesive Rash    Band Aid & some tapes      PE Today's Vitals   04/29/23 1151  BP: 104/60  Pulse: 75  Temp: 97.9 F (36.6 C)  TempSrc: Oral  SpO2: 97%  Weight: 111 lb (50.3 kg)  Height: 5' 1.25" (1.556 m)   Body mass index is 20.8 kg/m.  Physical Exam Vitals reviewed. Exam conducted with a chaperone present.  Constitutional:      General: She is not in acute distress.    Appearance: Normal appearance.  HENT:     Head: Normocephalic and atraumatic.     Nose: Nose normal.  Eyes:     Extraocular Movements: Extraocular movements intact.     Conjunctiva/sclera: Conjunctivae normal.  Neck:     Thyroid: No thyroid mass, thyromegaly or thyroid tenderness.  Pulmonary:     Effort: Pulmonary effort is normal.  Chest:     Chest wall: No mass or tenderness.  Breasts:    Right: Inverted nipple present. No  swelling, mass, nipple discharge, skin change or tenderness.     Left: Inverted nipple present. No swelling,  mass, nipple discharge, skin change or tenderness.     Comments: Long times nipple inversion Abdominal:     General: There is no distension.     Palpations: Abdomen is soft.     Tenderness: There is no abdominal tenderness.  Genitourinary:    General: Normal vulva.     Exam position: Lithotomy position.     Urethra: No prolapse.     Vagina: Prolapsed vaginal walls present. No vaginal discharge or bleeding.     Cervix: Normal. No lesion.     Uterus: Normal. Not enlarged and not tender.      Adnexa: Right adnexa normal and left adnexa normal.     Comments: Stage 2 cystocele, stage 1-2 apical prolapse Musculoskeletal:        General: Normal range of motion.     Cervical back: Normal range of motion.  Lymphadenopathy:     Upper Body:     Right upper body: No axillary adenopathy.     Left upper body: No axillary adenopathy.     Lower Body: No right inguinal adenopathy. No left inguinal adenopathy.  Skin:    General: Skin is warm and dry.  Neurological:     General: No focal deficit present.     Mental Status: She is alert.  Psychiatric:        Mood and Affect: Mood normal.        Behavior: Behavior normal.      Assessment and Plan:        Well woman exam with routine gynecological exam Assessment & Plan: Cervical cancer screening performed according to ASCCP guidelines. Encouraged annual mammogram screening Colonoscopy UTD DXA due Nov 2025. Ordered by PCP Labs and immunizations with her primary Encouraged safe sexual practices as indicated Encouraged healthy lifestyle practices with diet and exercise For patients over 70yo, I recommend 1200mg  calcium daily and 800IU of vitamin D daily.    HSV (herpes simplex virus) anogenital infection -     valACYclovir HCl; Take 1 tablet (1,000 mg total) by mouth as needed for up to 5 days.  Dispense: 30 tablet; Refill:  3  Incontinence in female Assessment & Plan: Continue PFPT   Postmenopausal atrophic vaginitis -     Estradiol; Insert 0.5 gram vaginally twice weekly.  Dispense: 42.5 g; Refill: 2  Osteopenia of left hip  Other specified personal risk factors, not elsewhere classified  Pelvic organ prolapse quantification stage 2 cystocele Assessment & Plan: Continue PFPT Declined pessary at this time     Rosalyn Gess, MD

## 2023-05-05 ENCOUNTER — Encounter: Payer: Self-pay | Admitting: Physical Therapy

## 2023-05-06 ENCOUNTER — Encounter: Payer: Self-pay | Admitting: Obstetrics and Gynecology

## 2023-05-06 DIAGNOSIS — R32 Unspecified urinary incontinence: Secondary | ICD-10-CM

## 2023-05-06 NOTE — Telephone Encounter (Signed)
 Refer to communication w/ pt/provider via mychart encounter dated 04/29/2023. Encounter closed.

## 2023-05-07 NOTE — Telephone Encounter (Signed)
 Patient was seen for AEX 04/29/23  Order pended for referral to physical therapy for pelvic floor PT

## 2023-05-08 ENCOUNTER — Ambulatory Visit: Payer: Self-pay | Attending: Obstetrics and Gynecology | Admitting: Physical Therapy

## 2023-05-08 ENCOUNTER — Encounter: Payer: Self-pay | Admitting: Physical Therapy

## 2023-05-08 DIAGNOSIS — R42 Dizziness and giddiness: Secondary | ICD-10-CM | POA: Diagnosis not present

## 2023-05-08 DIAGNOSIS — M6281 Muscle weakness (generalized): Secondary | ICD-10-CM | POA: Insufficient documentation

## 2023-05-08 DIAGNOSIS — R2681 Unsteadiness on feet: Secondary | ICD-10-CM | POA: Insufficient documentation

## 2023-05-08 DIAGNOSIS — R293 Abnormal posture: Secondary | ICD-10-CM | POA: Insufficient documentation

## 2023-05-08 DIAGNOSIS — R279 Unspecified lack of coordination: Secondary | ICD-10-CM | POA: Diagnosis not present

## 2023-05-08 NOTE — Therapy (Signed)
 OUTPATIENT PHYSICAL THERAPY FEMALE PELVIC TREATMENT   Patient Name: Cassandra Herring MRN: [id number] DOB:01/20/1946, 78 y.o., female Today's Date: 05/08/2023 Progress Note Reporting Period 11/21/22 to 03/03/23 to 05/08/23 Patient limited in her ability to return from evaluation to 03/03/23 then pt took time between last visit and requested to return due to several questions about pelvic floor and prolapse after seeing gyn   See note below for Objective Data and Assessment of Progress/Goals.     END OF SESSION:  PT End of Session - 05/08/23 1019     Visit Number 4    Date for PT Re-Evaluation 08/07/23    Authorization Type Medicare    PT Start Time 1015    PT Stop Time 1100    PT Time Calculation (min) 45 min    Activity Tolerance Patient tolerated treatment well    Behavior During Therapy WFL for tasks assessed/performed              Past Medical History:  Diagnosis Date   Anemia    Basal cell carcinoma    Bursitis    left shoulder    Crohn disease (HCC)    HNP (herniated nucleus pulposus)    Mass    gluteal   Osteopenia    Partial tear of left subscapularis tendon    STD (sexually transmitted disease)    Hx of genital warts   Tendon tear    shoulder   Thyroid nodule    Vertigo    Vitamin D deficiency    Past Surgical History:  Procedure Laterality Date   BREAST SURGERY     implants-1981, removal-2003   MOHS SURGERY Left 10/27/2020   left side of nose   Patient Active Problem List   Diagnosis Date Noted   Well woman exam with routine gynecological exam 04/29/2023   HSV (herpes simplex virus) anogenital infection 04/29/2023   Pelvic organ prolapse quantification stage 2 cystocele 04/29/2023   Incontinence in female 11/06/2022   Drug reaction 05/01/2020   Crohn's disease with complication (HCC) 09/29/2018   Female climacteric state 11/25/2017   Osteopenia of left hip 11/25/2017   Rosacea 11/24/2017   Thyroid nodule 06/27/2015   Mass 09/27/2011     PCP: Cassandra Sias, MD  REFERRING PROVIDER: Rosalyn Gess, MD   REFERRING DIAG: R32 (ICD-10-CM) - Incontinence in female  THERAPY DIAG:  Muscle weakness (generalized)  Unspecified lack of coordination  Abnormal posture  Rationale for Evaluation and Treatment: Rehabilitation  ONSET DATE: ~ 1 month  SUBJECTIVE:  SUBJECTIVE STATEMENT: Pt reports she saw gyn and updated cystocele and does confirm grade 2. Pt does use estrogen cream and continues to do so, has several questions about prolapse. Pt reports she does have herpes and thinks she is in an outbreak currently and unable to have reassessment internally.   Fluid intake: Yes: 60 oz per day of fluid (water and green tea in morning, makes a juice with vegetable/fruit    PAIN: 05/08/23 no   Eval: Are you having pain? Yes NPRS scale: 6/10 Pain location:  low back   Pain type: aching Pain description: intermittent   Aggravating factors: with crohns medication, unsure if weakness of stomach or a side effect of meds, (only in the morning and could be bed) Relieving factors: stretching, walking  PRECAUTIONS: None  RED FLAGS: None   WEIGHT BEARING RESTRICTIONS: No  FALLS:  Has patient fallen in last 6 months? No  LIVING ENVIRONMENT: Lives with: lives with their family Lives in: House/apartment   OCCUPATION: retired  PLOF: Independent  PATIENT GOALS: to improve prolapse, decreased urinary leakage, and fecal leakage  PERTINENT HISTORY:  Crohn's disease, was on HRT for over 20 years, was on estradiol cream, Osteopenia,   Sexual abuse: No  BOWEL MOVEMENT: Pain with bowel movement: No Type of bowel movement:Type (Bristol Stool Scale) 5-6, Frequency 4-5x daily, and Strain No Fully empty rectum: Yes: but sometimes feels like she  has to go again after up and walking around Leakage: Yes: with looser stool Pads: Yes: cotton ball placed there sometimes Fiber supplement: No  URINATION: Pain with urination: No Fully empty bladder: Yes: feels like she needs to rock to fully empty Stream: Strong Urgency: No Frequency: sometimes in the morning around every 45 min to hour; then 2 hours Leakage: Urge to void and Walking to the bathroom Pads: No  INTERCOURSE: Pain with intercourse:  not painful, does have dryness uses a coconut oil Ability to have vaginal penetration:  Yes:   Climax: not painful Marinoff Scale: 0/3  PREGNANCY: Vaginal deliveries 0 Tearing No C-section deliveries 0 Currently pregnant No  PROLAPSE: None   OBJECTIVE:  Note: Objective measures were completed at Evaluation unless otherwise noted.  DIAGNOSTIC FINDINGS:    COGNITION: Overall cognitive status: Within functional limits for tasks assessed     SENSATION: Light touch: Appears intact Proprioception: Appears intact  MUSCLE LENGTH: Bil hamstrings and adductors limited by 25%  LUMBAR SPECIAL TESTS:  WFL  FUNCTIONAL TESTS:  WFL  GAIT: WFL  POSTURE: rounded shoulders  PELVIC ALIGNMENT:WFL  LUMBARAROM/PROM:  A/PROM A/PROM  eval  Flexion WFL  Extension WFL  Right lateral flexion Limited by 25%  Left lateral flexion Limited by 25%  Right rotation Limited by 25%  Left rotation Limited by 25%   (Blank rows = not tested)  LOWER EXTREMITY ROM:  WFL  LOWER EXTREMITY MMT:  Bil hips grossly 4/5; knees 5/5  PALPATION:   General  mild fascial restrictions in lower abdomen no TTP                External Perineal Exam [facility] : [facility]                             Internal Pelvic Floor [facility]  Patient confirms identification and approves PT to assess internal pelvic floor and treatment Yes No emotional/communication barriers or cognitive limitation. Patient is motivated to learn. Patient understands and agrees with treatment goals  and plan. PT explains patient  will be examined in standing, sitting, and lying down to see how their muscles and joints work. When they are ready, they will be asked to remove their underwear so PT can examine their perineum. The patient is also given the option of providing their own chaperone as one is not provided in our facility. The patient also has the right and is explained the right to defer or refuse any part of the evaluation or treatment including the internal exam. With the patient's consent, PT will use one gloved finger to gently assess the muscles of the pelvic floor, seeing how well it contracts and relaxes and if there is muscle symmetry. After, the patient will get dressed and PT and patient will discuss exam findings and plan of care. PT and patient discuss plan of care, schedule, attendance policy and HEP activities.  PELVIC MMT:   MMT eval  Vaginal 4/5; 7 reps, 8s  Internal Anal Sphincter   External Anal Sphincter   Puborectalis   Diastasis Recti   (Blank rows = not tested)        TONE: WFL  PROLAPSE: Possible grade 2 anterior vaginal wall laxity with cough in hooklying   TODAY'S TREATMENT:                                                                                                                              DATE:   02/17/23: Pt educated on voiding mechanics for improved bowel evacuation, pt has stool leakage after having a bowel movement when she had gotten up and walked around. Then notes some stool emptying. Usually type 6-5. Pt educated on step stool and relaxation techniques to fully empty and decreased strain at pelvic floor as able to decreased fecal leakage.  Progressed HEP as well for increased frequency and reps of pelvic floor contractions and in various positions to improve pelvic floor strength with various positions to decreased leakage in supine/standing/sitting.   03/03/23: Pt given handouts and reviewed with pt on urge drill and bladder irritants  and prolapse relief positions Pt had several questions about these and all reviewed but required extra time during session.  Pt encouraged to continue HEP and recommendations and agreed   05/08/23 - reassessment Pt had several questions about prolapse, does remember discussing vaginal wall laxity at PT eval at 11/21/22 however didn't think about this as a "prolapse" until MD diagnosed her with this at recent appointment. POC discussed with pt for pressure management, lifting mechanics, breathing mechanics, and strengthening for core/glutes/hips/pelvic floor for decreased symptoms, to prevent worsening, and improve grade level Re-educated on pelvic propping positions at end of day or when pressure symptoms are present - pt reports she does still have this handout and will incorporate.  Educated on completing 5-10 pelvic floor contractions at normal pace when feeling pressure at vaginal area to improve pelvic floor support and decreased symptoms when standing for over an hour or going on a longer hike.  X10  breathing and pelvic floor coordination in sitting and standing X10 squats with pelvic floor and breathing coordination     PATIENT EDUCATION:  Education details: [id number] Person educated: Patient Education method: Explanation, Demonstration, Tactile cues, Verbal cues, and Handouts Education comprehension: verbalized understanding and returned demonstration  HOME EXERCISE PROGRAM: [id number]  ASSESSMENT:  CLINICAL IMPRESSION: Patient presents for treatment, has seen good improvement so far with treatment. Most of session spent on answering pt's questions about prolapse, pelvic floor, coordination of pelvic floor and breathing mechanics. Pt tolerated well but does need max verbal cues, tactile cues, and extra time for coordination of pelvic floor and breathing mechanics to decreased pressure on pelvic floor and prolapse to limit progression/severity of symptoms and allow pt to be active with  her workouts, hiking, etc. Pt would benefit from additional PT to further address deficits.    OBJECTIVE IMPAIRMENTS: decreased activity tolerance, decreased coordination, decreased endurance, decreased strength, increased fascial restrictions, impaired flexibility, improper body mechanics, and postural dysfunction.   ACTIVITY LIMITATIONS: continence  PARTICIPATION LIMITATIONS: community activity  PERSONAL FACTORS: Time since onset of injury/illness/exacerbation , crohns are also affecting patient's functional outcome.   REHAB POTENTIAL: Good  CLINICAL DECISION MAKING: Stable/uncomplicated  EVALUATION COMPLEXITY: Low   GOALS: Goals reviewed with patient? Yes  SHORT TERM GOALS: Target date: 12/18/22  Pt to be I with HEP.  Baseline: Goal status: MET  2.  Pt will have 25% less urgency due to bladder retraining and strengthening  Baseline:  Goal status: MET  3.  Pt to be I with urge drill for decreased urinary leakage  Baseline:  Goal status: MET  4.  Pt to be I with voiding mechanics and abdominal massage for improved bowel habits.  Baseline:  Goal status: MET   LONG TERM GOALS: Target date: 03/24/23  Pt to be I with advanced HEP.  Baseline:  Goal status: on going  2.  Pt will have 50% less urgency due to bladder retraining and strengthening  Baseline:  Goal status:  on going  3.  Pt to report improved time between bladder voids to at least 2.5 hours for improved QOL with decreased urinary frequency.   Baseline:  Goal status:  on going  4.  Pt will report her bowel movements are complete due to improved bowel habits and evacuation techniques at least 75% of the time.  Baseline:  Goal status:  on going  5.  Pt will be able to functional actions such as walking for 30 mins without leakage  Baseline:  Goal status:  on going  6. Pt to be I with pressure management with functional activities such as squatting with improved management of intraabdominal pressure for   decreased strain at pelvic floor.  Baseline:  Goal status:  on going PLAN:  PT FREQUENCY: 1-2x/week  PT DURATION:  15 sessions  PLANNED INTERVENTIONS: 97110-Therapeutic exercises, 97530- Therapeutic activity, 97112- Neuromuscular re-education, 97535- Self Care, 14782- Manual therapy, [id number]- Aquatic Therapy, Patient/Family education, Taping, Dry Needling, Joint mobilization, Spinal mobilization, Scar mobilization, Cryotherapy, Moist heat, and Biofeedback  PLAN FOR NEXT SESSION: pressure management, urge drill, voiding mechanics, breathing mechanics, coordination of pelvic floor and breathing and with activity, abdominal massage   Otelia Sergeant, PT, DPT 05/07/2509:19 AM

## 2023-05-13 ENCOUNTER — Ambulatory Visit: Admitting: Physical Therapy

## 2023-05-13 ENCOUNTER — Encounter: Payer: Self-pay | Admitting: Physical Therapy

## 2023-05-13 DIAGNOSIS — M6281 Muscle weakness (generalized): Secondary | ICD-10-CM

## 2023-05-13 DIAGNOSIS — R279 Unspecified lack of coordination: Secondary | ICD-10-CM

## 2023-05-13 DIAGNOSIS — R42 Dizziness and giddiness: Secondary | ICD-10-CM

## 2023-05-13 DIAGNOSIS — R2681 Unsteadiness on feet: Secondary | ICD-10-CM | POA: Diagnosis not present

## 2023-05-13 DIAGNOSIS — R293 Abnormal posture: Secondary | ICD-10-CM

## 2023-05-13 NOTE — Therapy (Signed)
 OUTPATIENT PHYSICAL THERAPY FEMALE PELVIC TREATMENT   Patient Name: Valeria Boza MRN: [id number] DOB:12-04-1945, 78 y.o., female Today's Date: 05/13/2023 Progress Note Reporting Period 11/21/22 to 03/03/23 to 05/08/23 Patient limited in her ability to return from evaluation to 03/03/23 then pt took time between last visit and requested to return due to several questions about pelvic floor and prolapse after seeing gyn   See note below for Objective Data and Assessment of Progress/Goals.     END OF SESSION:  PT End of Session - 05/13/23 1401     Visit Number 5    Date for PT Re-Evaluation 08/07/23    Authorization Type Medicare    Progress Note Due on Visit 14    PT Start Time 1400    PT Stop Time 1440    PT Time Calculation (min) 40 min    Activity Tolerance Patient tolerated treatment well    Behavior During Therapy WFL for tasks assessed/performed               Past Medical History:  Diagnosis Date   Anemia    Basal cell carcinoma    Bursitis    left shoulder    Crohn disease (HCC)    HNP (herniated nucleus pulposus)    Mass    gluteal   Osteopenia    Partial tear of left subscapularis tendon    STD (sexually transmitted disease)    Hx of genital warts   Tendon tear    shoulder   Thyroid nodule    Vertigo    Vitamin D deficiency    Past Surgical History:  Procedure Laterality Date   BREAST SURGERY     implants-1981, removal-2003   MOHS SURGERY Left 10/27/2020   left side of nose   Patient Active Problem List   Diagnosis Date Noted   Well woman exam with routine gynecological exam 04/29/2023   HSV (herpes simplex virus) anogenital infection 04/29/2023   Pelvic organ prolapse quantification stage 2 cystocele 04/29/2023   Incontinence in female 11/06/2022   Drug reaction 05/01/2020   Crohn's disease with complication (HCC) 09/29/2018   Female climacteric state 11/25/2017   Osteopenia of left hip 11/25/2017   Rosacea 11/24/2017   Thyroid nodule  06/27/2015   Mass 09/27/2011    PCP: Titus Formosa, MD  REFERRING PROVIDER: Romaine Closs, MD   REFERRING DIAG: R32 (ICD-10-CM) - Incontinence in female  THERAPY DIAG:  Muscle weakness (generalized)  Dizziness and giddiness  Unsteadiness on feet  Unspecified lack of coordination  Abnormal posture  Rationale for Evaluation and Treatment: Rehabilitation  ONSET DATE: ~ 1 month  SUBJECTIVE:  SUBJECTIVE STATEMENT: Pt reports that she needs more exercises to address prolapse    Pt reports she saw gyn and updated cystocele and does confirm grade 2. Pt does use estrogen cream and continues to do so, has several questions about prolapse. Pt reports she does have herpes and thinks she is in an outbreak currently and unable to have reassessment internally.   Fluid intake: Yes: 60 oz per day of fluid (water and green tea in morning, makes a juice with vegetable/fruit    PAIN: 05/08/23 no   Eval: Are you having pain? Yes NPRS scale: 6/10 Pain location:  low back   Pain type: aching Pain description: intermittent   Aggravating factors: with crohns medication, unsure if weakness of stomach or a side effect of meds, (only in the morning and could be bed) Relieving factors: stretching, walking  PRECAUTIONS: None  RED FLAGS: None   WEIGHT BEARING RESTRICTIONS: No  FALLS:  Has patient fallen in last 6 months? No  LIVING ENVIRONMENT: Lives with: lives with their family Lives in: House/apartment   OCCUPATION: retired  PLOF: Independent  PATIENT GOALS: to improve prolapse, decreased urinary leakage, and fecal leakage  PERTINENT HISTORY:  Crohn's disease, was on HRT for over 20 years, was on estradiol cream, Osteopenia,   Sexual abuse: No  BOWEL MOVEMENT: Pain with bowel movement:  No Type of bowel movement:Type (Bristol Stool Scale) 5-6, Frequency 4-5x daily, and Strain No Fully empty rectum: Yes: but sometimes feels like she has to go again after up and walking around Leakage: Yes: with looser stool Pads: Yes: cotton ball placed there sometimes Fiber supplement: No  URINATION: Pain with urination: No Fully empty bladder: Yes: feels like she needs to rock to fully empty Stream: Strong Urgency: No Frequency: sometimes in the morning around every 45 min to hour; then 2 hours Leakage: Urge to void and Walking to the bathroom Pads: No  INTERCOURSE: Pain with intercourse:  not painful, does have dryness uses a coconut oil Ability to have vaginal penetration:  Yes:   Climax: not painful Marinoff Scale: 0/3  PREGNANCY: Vaginal deliveries 0 Tearing No C-section deliveries 0 Currently pregnant No  PROLAPSE: None   OBJECTIVE:  Note: Objective measures were completed at Evaluation unless otherwise noted.  DIAGNOSTIC FINDINGS:    COGNITION: Overall cognitive status: Within functional limits for tasks assessed     SENSATION: Light touch: Appears intact Proprioception: Appears intact  MUSCLE LENGTH: Bil hamstrings and adductors limited by 25%  LUMBAR SPECIAL TESTS:  WFL  FUNCTIONAL TESTS:  WFL  GAIT: WFL  POSTURE: rounded shoulders  PELVIC ALIGNMENT:WFL  LUMBARAROM/PROM:  A/PROM A/PROM  eval  Flexion WFL  Extension WFL  Right lateral flexion Limited by 25%  Left lateral flexion Limited by 25%  Right rotation Limited by 25%  Left rotation Limited by 25%   (Blank rows = not tested)  LOWER EXTREMITY ROM:  WFL  LOWER EXTREMITY MMT:  Bil hips grossly 4/5; knees 5/5  PALPATION:   General  mild fascial restrictions in lower abdomen no TTP                External Perineal Exam [facility]                             Internal Pelvic Floor [facility]  Patient confirms identification and approves PT to assess internal pelvic floor and treatment  Yes No emotional/communication barriers or cognitive limitation. Patient is motivated to  learn. Patient understands and agrees with treatment goals and plan. PT explains patient will be examined in standing, sitting, and lying down to see how their muscles and joints work. When they are ready, they will be asked to remove their underwear so PT can examine their perineum. The patient is also given the option of providing their own chaperone as one is not provided in our facility. The patient also has the right and is explained the right to defer or refuse any part of the evaluation or treatment including the internal exam. With the patient's consent, PT will use one gloved finger to gently assess the muscles of the pelvic floor, seeing how well it contracts and relaxes and if there is muscle symmetry. After, the patient will get dressed and PT and patient will discuss exam findings and plan of care. PT and patient discuss plan of care, schedule, attendance policy and HEP activities.  PELVIC MMT:   MMT eval  Vaginal 4/5; 7 reps, 8s  Internal Anal Sphincter   External Anal Sphincter   Puborectalis   Diastasis Recti   (Blank rows = not tested)        TONE: WFL  PROLAPSE: Possible grade 2 anterior vaginal wall laxity with cough in hooklying   TODAY'S TREATMENT:                                                                                                                              DATE:   02/17/23: Pt educated on voiding mechanics for improved bowel evacuation, pt has stool leakage after having a bowel movement when she had gotten up and walked around. Then notes some stool emptying. Usually type 6-5. Pt educated on step stool and relaxation techniques to fully empty and decreased strain at pelvic floor as able to decreased fecal leakage.  Progressed HEP as well for increased frequency and reps of pelvic floor contractions and in various positions to improve pelvic floor strength with various  positions to decreased leakage in supine/standing/sitting.   03/03/23: Pt given handouts and reviewed with pt on urge drill and bladder irritants and prolapse relief positions Pt had several questions about these and all reviewed but required extra time during session.  Pt encouraged to continue HEP and recommendations and agreed   05/08/23 - reassessment Pt had several questions about prolapse, does remember discussing vaginal wall laxity at PT eval at 11/21/22 however didn't think about this as a "prolapse" until MD diagnosed her with this at recent appointment. POC discussed with pt for pressure management, lifting mechanics, breathing mechanics, and strengthening for core/glutes/hips/pelvic floor for decreased symptoms, to prevent worsening, and improve grade level Re-educated on pelvic propping positions at end of day or when pressure symptoms are present - pt reports she does still have this handout and will incorporate.  Educated on completing 5-10 pelvic floor contractions at normal pace when feeling pressure at vaginal area to improve pelvic floor support and decreased symptoms when  standing for over an hour or going on a longer hike.  X10 breathing and pelvic floor coordination in sitting and standing X10 squats with pelvic floor and breathing coordination   4/15 Neuro reed- transverse abdominis breath with ball press supine,20 reps  hip adduction with ball with transverse abdominis breath 20 reps Abd abduction with red theraband 20 reps Shoulder extension with transverse abdominis breath - difficult- pt unable to do d/t shoulder pain bilat   PATIENT EDUCATION:  Education details: [id number] Person educated: Patient Education method: Explanation, Demonstration, Tactile cues, Verbal cues, and Handouts Education comprehension: verbalized understanding and returned demonstration  HOME EXERCISE PROGRAM: [id number]  ASSESSMENT:  CLINICAL IMPRESSION: Pt did well with pressure  management with exercises, she is somewhat limited d/t pain and AROM limitations in her shoulders ( torn ligaments and cannot have surgery). Added exercises to her HEP that work without shoulder movements. Pt with breath holding strategies.      OBJECTIVE IMPAIRMENTS: decreased activity tolerance, decreased coordination, decreased endurance, decreased strength, increased fascial restrictions, impaired flexibility, improper body mechanics, and postural dysfunction.   ACTIVITY LIMITATIONS: continence  PARTICIPATION LIMITATIONS: community activity  PERSONAL FACTORS: Time since onset of injury/illness/exacerbation , crohns are also affecting patient's functional outcome.   REHAB POTENTIAL: Good  CLINICAL DECISION MAKING: Stable/uncomplicated  EVALUATION COMPLEXITY: Low   GOALS: Goals reviewed with patient? Yes  SHORT TERM GOALS: Target date: 12/18/22  Pt to be I with HEP.  Baseline: Goal status: MET  2.  Pt will have 25% less urgency due to bladder retraining and strengthening  Baseline:  Goal status: MET  3.  Pt to be I with urge drill for decreased urinary leakage  Baseline:  Goal status: MET  4.  Pt to be I with voiding mechanics and abdominal massage for improved bowel habits.  Baseline:  Goal status: MET   LONG TERM GOALS: Target date: 03/24/23  Pt to be I with advanced HEP.  Baseline:  Goal status: on going  2.  Pt will have 50% less urgency due to bladder retraining and strengthening  Baseline:  Goal status:  on going  3.  Pt to report improved time between bladder voids to at least 2.5 hours for improved QOL with decreased urinary frequency.   Baseline:  Goal status:  on going  4.  Pt will report her bowel movements are complete due to improved bowel habits and evacuation techniques at least 75% of the time.  Baseline:  Goal status:  on going  5.  Pt will be able to functional actions such as walking for 30 mins without leakage  Baseline:  Goal  status:  on going  6. Pt to be I with pressure management with functional activities such as squatting with improved management of intraabdominal pressure for  decreased strain at pelvic floor.  Baseline:  Goal status:  on going PLAN:  PT FREQUENCY: 1-2x/week  PT DURATION:  15 sessions  PLANNED INTERVENTIONS: 97110-Therapeutic exercises, 97530- Therapeutic activity, 97112- Neuromuscular re-education, 97535- Self Care, [id number]- Manual therapy, [phone]- Aquatic Therapy, Patient/Family education, Taping, Dry Needling, Joint mobilization, Spinal mobilization, Scar mobilization, Cryotherapy, Moist heat, and Biofeedback  PLAN FOR NEXT SESSION: pressure management, urge drill, voiding mechanics, breathing mechanics, coordination of pelvic floor and breathing and with activity, abdominal massage   Otelia Sergeant, PT, DPT 04/15/252:36 PM

## 2023-05-19 DIAGNOSIS — H2513 Age-related nuclear cataract, bilateral: Secondary | ICD-10-CM | POA: Diagnosis not present

## 2023-05-20 ENCOUNTER — Ambulatory Visit: Admitting: Physical Therapy

## 2023-05-20 ENCOUNTER — Encounter: Payer: Self-pay | Admitting: Physical Therapy

## 2023-05-20 DIAGNOSIS — M6281 Muscle weakness (generalized): Secondary | ICD-10-CM | POA: Diagnosis not present

## 2023-05-20 DIAGNOSIS — R279 Unspecified lack of coordination: Secondary | ICD-10-CM

## 2023-05-20 DIAGNOSIS — R293 Abnormal posture: Secondary | ICD-10-CM

## 2023-05-20 DIAGNOSIS — R42 Dizziness and giddiness: Secondary | ICD-10-CM

## 2023-05-20 DIAGNOSIS — R2681 Unsteadiness on feet: Secondary | ICD-10-CM

## 2023-05-20 NOTE — Therapy (Signed)
 OUTPATIENT PHYSICAL THERAPY FEMALE PELVIC TREATMENT   Patient Name: Cassandra Herring MRN: [id number] DOB:[birth year], 78 y.o., female Today's Date: 05/20/2023 Progress Note Reporting Period 11/21/22 to 03/03/23 to 05/08/23 Patient limited in her ability to return from evaluation to 03/03/23 then pt took time between last visit and requested to return due to several questions about pelvic floor and prolapse after seeing gyn   See note below for Objective Data and Assessment of Progress/Goals.     END OF SESSION:  PT End of Session - 05/20/23 1647     Visit Number 6    Date for PT Re-Evaluation 08/07/23    Authorization Type Medicare    Progress Note Due on Visit 14    PT Start Time 1615    PT Stop Time 1655    PT Time Calculation (min) 40 min    Activity Tolerance Patient tolerated treatment well    Behavior During Therapy WFL for tasks assessed/performed                Past Medical History:  Diagnosis Date   Anemia    Basal cell carcinoma    Bursitis    left shoulder    Crohn disease (HCC)    HNP (herniated nucleus pulposus)    Mass    gluteal   Osteopenia    Partial tear of left subscapularis tendon    STD (sexually transmitted disease)    Hx of genital warts   Tendon tear    shoulder   Thyroid  nodule    Vertigo    Vitamin D deficiency    Past Surgical History:  Procedure Laterality Date   BREAST SURGERY     implants-1981, removal-2003   MOHS SURGERY Left 10/27/2020   left side of nose   Patient Active Problem List   Diagnosis Date Noted   Well woman exam with routine gynecological exam 04/29/2023   HSV (herpes simplex virus) anogenital infection 04/29/2023   Pelvic organ prolapse quantification stage 2 cystocele 04/29/2023   Incontinence in female 11/06/2022   Drug reaction 05/01/2020   Crohn's disease with complication (HCC) 09/29/2018   Female climacteric state 11/25/2017   Osteopenia of left hip 11/25/2017   Rosacea 11/24/2017   Thyroid   nodule 06/27/2015   Mass 09/27/2011    PCP: Titus Formosa, MD  REFERRING PROVIDER: Romaine Closs, MD   REFERRING DIAG: R32 (ICD-10-CM) - Incontinence in female  THERAPY DIAG:  Muscle weakness (generalized)  Dizziness and giddiness  Unspecified lack of coordination  Abnormal posture  Unsteadiness on feet  Rationale for Evaluation and Treatment: Rehabilitation  ONSET DATE: ~ 1 month  SUBJECTIVE:  SUBJECTIVE STATEMENT: Pt reports that she she has some questions Has to modify for her exercises for her shoulder Does her exercises on the floor Has a squatty potty, has fecal incontinence all morning Trying to incorporate her core engagement to her day to minimize pressures.  Gets wiped out with Remicade  IV  Fluid intake: Yes: 60 oz per day of fluid (water and green tea in morning, makes a juice with vegetable/fruit    PAIN: 05/08/23 no   Eval: Are you having pain? Yes NPRS scale: 6/10 Pain location:  low back   Pain type: aching Pain description: intermittent   Aggravating factors: with crohns medication, unsure if weakness of stomach or a side effect of meds, (only in the morning and could be bed) Relieving factors: stretching, walking  PRECAUTIONS: None  RED FLAGS: None   WEIGHT BEARING RESTRICTIONS: No  FALLS:  Has patient fallen in last 6 months? No  LIVING ENVIRONMENT: Lives with: lives with their family Lives in: House/apartment   OCCUPATION: retired  PLOF: Independent  PATIENT GOALS: to improve prolapse, decreased urinary leakage, and fecal leakage  PERTINENT HISTORY:  Crohn's disease, was on HRT for over 20 years, was on estradiol  cream, Osteopenia,   Sexual abuse: No  BOWEL MOVEMENT: Pain with bowel movement: No Type of bowel movement:Type (Bristol Stool  Scale) 5-6, Frequency 4-5x daily, and Strain No Fully empty rectum: Yes: but sometimes feels like she has to go again after up and walking around Leakage: Yes: with looser stool Pads: Yes: cotton ball placed there sometimes Fiber supplement: No  URINATION: Pain with urination: No Fully empty bladder: Yes: feels like she needs to rock to fully empty Stream: Strong Urgency: yes Frequency: sometimes in the morning around every 45 min to hour; then 2 hours Leakage: Urge to void and Walking to the bathroom Pads: No  INTERCOURSE: Pain with intercourse:  not painful, does have dryness uses a coconut oil Ability to have vaginal penetration:  Yes:   Climax: not painful Marinoff Scale: 0/3  PREGNANCY: Vaginal deliveries 0 Tearing No C-section deliveries 0 Currently pregnant No  PROLAPSE: None   OBJECTIVE:  Note: Objective measures were completed at Evaluation unless otherwise noted.  DIAGNOSTIC FINDINGS:    COGNITION: Overall cognitive status: Within functional limits for tasks assessed     SENSATION: Light touch: Appears intact Proprioception: Appears intact  MUSCLE LENGTH: Bil hamstrings and adductors limited by 25%  LUMBAR SPECIAL TESTS:  WFL  FUNCTIONAL TESTS:  WFL  GAIT: WFL  POSTURE: rounded shoulders  PELVIC ALIGNMENT:WFL  LUMBARAROM/PROM:  A/PROM A/PROM  eval  Flexion WFL  Extension WFL  Right lateral flexion Limited by 25%  Left lateral flexion Limited by 25%  Right rotation Limited by 25%  Left rotation Limited by 25%   (Blank rows = not tested)  LOWER EXTREMITY ROM:  WFL  LOWER EXTREMITY MMT:  Bil hips grossly 4/5; knees 5/5  PALPATION:   General  mild fascial restrictions in lower abdomen no TTP                External Perineal Exam [facility]                             Internal Pelvic Floor [facility]  Patient confirms identification and approves PT to assess internal pelvic floor and treatment Yes No emotional/communication barriers or  cognitive limitation. Patient is motivated to learn. Patient understands and agrees with treatment goals and plan. PT  explains patient will be examined in standing, sitting, and lying down to see how their muscles and joints work. When they are ready, they will be asked to remove their underwear so PT can examine their perineum. The patient is also given the option of providing their own chaperone as one is not provided in our facility. The patient also has the right and is explained the right to defer or refuse any part of the evaluation or treatment including the internal exam. With the patient's consent, PT will use one gloved finger to gently assess the muscles of the pelvic floor, seeing how well it contracts and relaxes and if there is muscle symmetry. After, the patient will get dressed and PT and patient will discuss exam findings and plan of care. PT and patient discuss plan of care, schedule, attendance policy and HEP activities.  PELVIC MMT:   MMT eval  Vaginal 4/5; 7 reps, 8s  Internal Anal Sphincter   External Anal Sphincter   Puborectalis   Diastasis Recti   (Blank rows = not tested)        TONE: WFL  PROLAPSE: Possible grade 2 anterior vaginal wall laxity with cough in hooklying   TODAY'S TREATMENT:                                                                                                                              DATE:   02/17/23: Pt educated on voiding mechanics for improved bowel evacuation, pt has stool leakage after having a bowel movement when she had gotten up and walked around. Then notes some stool emptying. Usually type 6-5. Pt educated on step stool and relaxation techniques to fully empty and decreased strain at pelvic floor as able to decreased fecal leakage.  Progressed HEP as well for increased frequency and reps of pelvic floor contractions and in various positions to improve pelvic floor strength with various positions to decreased leakage in  supine/standing/sitting.   03/03/23: Pt given handouts and reviewed with pt on urge drill and bladder irritants and prolapse relief positions Pt had several questions about these and all reviewed but required extra time during session.  Pt encouraged to continue HEP and recommendations and agreed   05/08/23 - reassessment Pt had several questions about prolapse, does remember discussing vaginal wall laxity at PT eval at 11/21/22 however didn't think about this as a "prolapse" until MD diagnosed her with this at recent appointment. POC discussed with pt for pressure management, lifting mechanics, breathing mechanics, and strengthening for core/glutes/hips/pelvic floor for decreased symptoms, to prevent worsening, and improve grade level Re-educated on pelvic propping positions at end of day or when pressure symptoms are present - pt reports she does still have this handout and will incorporate.  Educated on completing 5-10 pelvic floor contractions at normal pace when feeling pressure at vaginal area to improve pelvic floor support and decreased symptoms when standing for over an hour or going on a longer hike.  X10 breathing and pelvic floor coordination in sitting and standing X10 squats with pelvic floor and breathing coordination   4/15 Neuro reed- transverse abdominis breath with ball press supine,20 reps  hip adduction with ball with transverse abdominis breath 20 reps Abd abduction with red theraband 20 reps Shoulder extension with transverse abdominis breath - difficult- pt unable to do d/t shoulder pain bilat  4/22/ Review of HEP Modification of exercises Review of progress and goals   PATIENT EDUCATION:  Education details: [id number] Person educated: Patient Education method: Explanation, Demonstration, Tactile cues, Verbal cues, and Handouts Education comprehension: verbalized understanding and returned demonstration  HOME EXERCISE PROGRAM: [id number]  ASSESSMENT:  CLINICAL  IMPRESSION: Tx focus- modification of exercises to reduce breath holding strategies. Pt does a lot of different exercise routines which we reviewed and modified to help with more optimal core coordination.  Pt with shoulder pain and limited by that but with good awareness and progressing with her exercises. Pt with a lot of commorbidities- Crohns and treatments that wipe her out.      OBJECTIVE IMPAIRMENTS: decreased activity tolerance, decreased coordination, decreased endurance, decreased strength, increased fascial restrictions, impaired flexibility, improper body mechanics, and postural dysfunction.   ACTIVITY LIMITATIONS: continence  PARTICIPATION LIMITATIONS: community activity  PERSONAL FACTORS: Time since onset of injury/illness/exacerbation , crohns are also affecting patient's functional outcome.   REHAB POTENTIAL: Good  CLINICAL DECISION MAKING: Stable/uncomplicated  EVALUATION COMPLEXITY: Low   GOALS: Goals reviewed with patient? Yes  SHORT TERM GOALS: Target date: 12/18/22  Pt to be I with HEP.  Baseline: Goal status: MET  2.  Pt will have 25% less urgency due to bladder retraining and strengthening  Baseline:  Goal status: MET  3.  Pt to be I with urge drill for decreased urinary leakage  Baseline:  Goal status: MET  4.  Pt to be I with voiding mechanics and abdominal massage for improved bowel habits.  Baseline:  Goal status: MET   LONG TERM GOALS: Target date: 03/24/23  Pt to be I with advanced HEP.  Baseline:  Goal status: on going  2.  Pt will have 50% less urgency due to bladder retraining and strengthening  Baseline:  Goal status:  on going  3.  Pt to report improved time between bladder voids to at least 2.5 hours for improved QOL with decreased urinary frequency.   Baseline:  Goal status:  on going  4.  Pt will report her bowel movements are complete due to improved bowel habits and evacuation techniques at least 75% of the time.   Baseline:  Goal status:  on going  5.  Pt will be able to functional actions such as walking for 30 mins without leakage  Baseline:  Goal status:  on going  6. Pt to be I with pressure management with functional activities such as squatting with improved management of intraabdominal pressure for  decreased strain at pelvic floor.  Baseline:  Goal status:  on going PLAN:  PT FREQUENCY: 1-2x/week  PT DURATION:  15 sessions  PLANNED INTERVENTIONS: 97110-Therapeutic exercises, 97530- Therapeutic activity, 97112- Neuromuscular re-education, 97535- Self Care, [id number]- Manual therapy, [phone]- Aquatic Therapy, Patient/Family education, Taping, Dry Needling, Joint mobilization, Spinal mobilization, Scar mobilization, Cryotherapy, Moist heat, and Biofeedback  PLAN FOR NEXT SESSION: pressure management, urge drill, voiding mechanics, breathing mechanics, coordination of pelvic floor and breathing and with activity, abdominal massage   Stephenie Navejas, PT 05/22/23 8:38 AM

## 2023-05-28 DIAGNOSIS — E7800 Pure hypercholesterolemia, unspecified: Secondary | ICD-10-CM | POA: Diagnosis not present

## 2023-06-11 ENCOUNTER — Encounter: Payer: Self-pay | Admitting: Obstetrics and Gynecology

## 2023-06-11 ENCOUNTER — Ambulatory Visit: Payer: Self-pay | Attending: Obstetrics and Gynecology | Admitting: Physical Therapy

## 2023-06-11 DIAGNOSIS — R32 Unspecified urinary incontinence: Secondary | ICD-10-CM

## 2023-06-11 DIAGNOSIS — R293 Abnormal posture: Secondary | ICD-10-CM | POA: Diagnosis not present

## 2023-06-11 DIAGNOSIS — R279 Unspecified lack of coordination: Secondary | ICD-10-CM | POA: Insufficient documentation

## 2023-06-11 DIAGNOSIS — M6281 Muscle weakness (generalized): Secondary | ICD-10-CM | POA: Diagnosis not present

## 2023-06-11 NOTE — Therapy (Signed)
 OUTPATIENT PHYSICAL THERAPY FEMALE PELVIC TREATMENT   Patient Name: Cassandra Herring MRN: [id number] DOB:May 17, 1945, 78 y.o., female Today's Date: 06/11/2023 Progress Note Reporting Period 11/21/22 to 03/03/23 to 05/08/23 Patient limited in her ability to return from evaluation to 03/03/23 then pt took time between last visit and requested to return due to several questions about pelvic floor and prolapse after seeing gyn   See note below for Objective Data and Assessment of Progress/Goals.     END OF SESSION:  PT End of Session - 06/11/23 1617     Visit Number 7    Date for PT Re-Evaluation 08/07/23    Authorization Type Medicare    Progress Note Due on Visit 14    PT Start Time 1611    PT Stop Time 1650    PT Time Calculation (min) 39 min    Activity Tolerance Patient tolerated treatment well    Behavior During Therapy WFL for tasks assessed/performed                Past Medical History:  Diagnosis Date   Anemia    Basal cell carcinoma    Bursitis    left shoulder    Crohn disease (HCC)    HNP (herniated nucleus pulposus)    Mass    gluteal   Osteopenia    Partial tear of left subscapularis tendon    STD (sexually transmitted disease)    Hx of genital warts   Tendon tear    shoulder   Thyroid  nodule    Vertigo    Vitamin D deficiency    Past Surgical History:  Procedure Laterality Date   BREAST SURGERY     implants-1981, removal-2003   MOHS SURGERY Left 10/27/2020   left side of nose   Patient Active Problem List   Diagnosis Date Noted   Well woman exam with routine gynecological exam 04/29/2023   HSV (herpes simplex virus) anogenital infection 04/29/2023   Pelvic organ prolapse quantification stage 2 cystocele 04/29/2023   Incontinence in female 11/06/2022   Drug reaction 05/01/2020   Crohn's disease with complication (HCC) 09/29/2018   Female climacteric state 11/25/2017   Osteopenia of left hip 11/25/2017   Rosacea 11/24/2017   Thyroid   nodule 06/27/2015   Mass 09/27/2011    PCP: Titus Formosa, MD  REFERRING PROVIDER: Romaine Closs, MD   REFERRING DIAG: R32 (ICD-10-CM) - Incontinence in female  THERAPY DIAG:  Muscle weakness (generalized)  Unspecified lack of coordination  Abnormal posture  Rationale for Evaluation and Treatment: Rehabilitation  ONSET DATE: ~ 1 month  SUBJECTIVE:  SUBJECTIVE STATEMENT: Pt reports that she she has some questions Has to modify for her exercises for her shoulder Does her exercises on the floor Has a squatty potty, has fecal incontinence all morning Trying to incorporate her core engagement to her day to minimize pressures.  Gets wiped out with Remicade  IV  Fluid intake: Yes: 60 oz per day of fluid (water and green tea in morning, makes a juice with vegetable/fruit   PAIN: 05/08/23 no   Eval: Are you having pain? Yes NPRS scale: 6/10 Pain location: low back   Pain type: aching Pain description: intermittent   Aggravating factors: with crohns medication, unsure if weakness of stomach or a side effect of meds, (only in the morning and could be bed) Relieving factors: stretching, walking  PRECAUTIONS: None  RED FLAGS: None   WEIGHT BEARING RESTRICTIONS: No  FALLS:  Has patient fallen in last 6 months? No  LIVING ENVIRONMENT: Lives with: lives with their family Lives in: House/apartment   OCCUPATION: retired  PLOF: Independent  PATIENT GOALS: to improve prolapse, decreased urinary leakage, and fecal leakage  PERTINENT HISTORY:  Crohn's disease, was on HRT for over 20 years, was on estradiol  cream, Osteopenia,   Sexual abuse: No  BOWEL MOVEMENT: Pain with bowel movement: No Type of bowel movement:Type (Bristol Stool Scale) 5-6, Frequency 4-5x daily, and Strain No Fully  empty rectum: Yes: but sometimes feels like she has to go again after up and walking around Leakage: Yes: with looser stool Pads: Yes: cotton ball placed there sometimes Fiber supplement: No  URINATION: Pain with urination: No Fully empty bladder: Yes: feels like she needs to rock to fully empty Stream: Strong Urgency: yes Frequency: sometimes in the morning around every 45 min to hour; then 2 hours Leakage: Urge to void and Walking to the bathroom Pads: No  INTERCOURSE: Pain with intercourse: not painful, does have dryness uses a coconut oil Ability to have vaginal penetration:  Yes:   Climax: not painful Marinoff Scale: 0/3  PREGNANCY: Vaginal deliveries 0 Tearing No C-section deliveries 0 Currently pregnant No  PROLAPSE: None   OBJECTIVE:  Note: Objective measures were completed at Evaluation unless otherwise noted.  DIAGNOSTIC FINDINGS:    COGNITION: Overall cognitive status: Within functional limits for tasks assessed     SENSATION: Light touch: Appears intact Proprioception: Appears intact  MUSCLE LENGTH: Bil hamstrings and adductors limited by 25%  LUMBAR SPECIAL TESTS:  WFL  FUNCTIONAL TESTS:  WFL  GAIT: WFL  POSTURE: rounded shoulders  PELVIC ALIGNMENT:WFL  LUMBARAROM/PROM:  A/PROM A/PROM  eval  Flexion WFL  Extension WFL  Right lateral flexion Limited by 25%  Left lateral flexion Limited by 25%  Right rotation Limited by 25%  Left rotation Limited by 25%   (Blank rows = not tested)  LOWER EXTREMITY ROM:  WFL  LOWER EXTREMITY MMT:  Bil hips grossly 4/5; knees 5/5  PALPATION:   General  mild fascial restrictions in lower abdomen no TTP                External Perineal Exam [facility]                             Internal Pelvic Floor [facility]  Patient confirms identification and approves PT to assess internal pelvic floor and treatment Yes No emotional/communication barriers or cognitive limitation. Patient is motivated to learn.  Patient understands and agrees with treatment goals and plan. PT explains patient will  be examined in standing, sitting, and lying down to see how their muscles and joints work. When they are ready, they will be asked to remove their underwear so PT can examine their perineum. The patient is also given the option of providing their own chaperone as one is not provided in our facility. The patient also has the right and is explained the right to defer or refuse any part of the evaluation or treatment including the internal exam. With the patient's consent, PT will use one gloved finger to gently assess the muscles of the pelvic floor, seeing how well it contracts and relaxes and if there is muscle symmetry. After, the patient will get dressed and PT and patient will discuss exam findings and plan of care. PT and patient discuss plan of care, schedule, attendance policy and HEP activities.  PELVIC MMT:   MMT eval  Vaginal 4/5; 7 reps, 8s  Internal Anal Sphincter   External Anal Sphincter   Puborectalis   Diastasis Recti   (Blank rows = not tested)        TONE: WFL  PROLAPSE: Possible grade 2 anterior vaginal wall laxity with cough in hooklying   TODAY'S TREATMENT:                                                                                                                              DATE:   05/08/23 - reassessment Pt had several questions about prolapse, does remember discussing vaginal wall laxity at PT eval at 11/21/22 however didn't think about this as a "prolapse" until MD diagnosed her with this at recent appointment. POC discussed with pt for pressure management, lifting mechanics, breathing mechanics, and strengthening for core/glutes/hips/pelvic floor for decreased symptoms, to prevent worsening, and improve grade level Re-educated on pelvic propping positions at end of day or when pressure symptoms are present - pt reports she does still have this handout and will incorporate.   Educated on completing 5-10 pelvic floor contractions at normal pace when feeling pressure at vaginal area to improve pelvic floor support and decreased symptoms when standing for over an hour or going on a longer hike.  X10 breathing and pelvic floor coordination in sitting and standing X10 squats with pelvic floor and breathing coordination   4/15 [facility] Neuro reed- transverse abdominis breath with ball press supine,20 reps  hip adduction with ball with transverse abdominis breath 20 reps Abd abduction with red theraband 20 reps Shoulder extension with transverse abdominis breath - difficult- pt unable to do d/t shoulder pain bilat  4/22[facility] Review of HEP Modification of exercises Review of progress and goals  06/11/23: HM Reviewed urge drill and HEP and bladder diary  Bridges 2x10 with transverse abdominis activation and exhale 3x10 transverse abdominis activations with exhale - needed max cues for techniques as noted bulging at abdomen pt then able to feel the difference Alt marching in hooklying with transverse abdominis activations and exhale x10  PATIENT  EDUCATION:  Education details: [id number] Person educated: Patient Education method: Explanation, Demonstration, Tactile cues, Verbal cues, and Handouts Education comprehension: verbalized understanding and returned demonstration  HOME EXERCISE PROGRAM: [id number]  ASSESSMENT:  CLINICAL IMPRESSION: Pt presents for treatment, reports she has needed to have space between sessions due to her medications and how she needs time to recover from it for several days. Pt has been motivated to return for all appointments and does do all recommendations. Pt continues to demonstrate need for max verbal cues and tactile cues for transverse abdominis activation and breathing mechanics to prevent abdominal bulging but demonstrated ability to get contraction well with this by end of session. Pt also benefits from education of bladder diary, core  activations, implementing core and breathing and pelvic floor activation with functional daily tasks like carrying groceries. Pt denied leakage or increased pressure during session. Pt would benefit from additional PT to further address deficits.      OBJECTIVE IMPAIRMENTS: decreased activity tolerance, decreased coordination, decreased endurance, decreased strength, increased fascial restrictions, impaired flexibility, improper body mechanics, and postural dysfunction.   ACTIVITY LIMITATIONS: continence  PARTICIPATION LIMITATIONS: community activity  PERSONAL FACTORS: Time since onset of injury/illness/exacerbation , crohns are also affecting patient's functional outcome.   REHAB POTENTIAL: Good  CLINICAL DECISION MAKING: Stable/uncomplicated  EVALUATION COMPLEXITY: Low   GOALS: Goals reviewed with patient? Yes  SHORT TERM GOALS: Target date: 12/18/22  Pt to be I with HEP.  Baseline: Goal status: MET  2.  Pt will have 25% less urgency due to bladder retraining and strengthening  Baseline:  Goal status: MET  3.  Pt to be I with urge drill for decreased urinary leakage  Baseline:  Goal status: MET  4.  Pt to be I with voiding mechanics and abdominal massage for improved bowel habits.  Baseline:  Goal status: MET   LONG TERM GOALS: Target date: 03/24/23  Pt to be I with advanced HEP.  Baseline:  Goal status: on going  2.  Pt will have 50% less urgency due to bladder retraining and strengthening  Baseline:  Goal status:  on going  3.  Pt to report improved time between bladder voids to at least 2.5 hours for improved QOL with decreased urinary frequency.   Baseline:  Goal status:  on going  4.  Pt will report her bowel movements are complete due to improved bowel habits and evacuation techniques at least 75% of the time.  Baseline:  Goal status:  on going  5.  Pt will be able to functional actions such as walking for 30 mins without leakage  Baseline:  Goal  status:  on going  6. Pt to be I with pressure management with functional activities such as squatting with improved management of intraabdominal pressure for  decreased strain at pelvic floor.  Baseline:  Goal status:  on going PLAN:  PT FREQUENCY: 1-2x/week  PT DURATION: 15 sessions  PLANNED INTERVENTIONS: 97110-Therapeutic exercises, 97530- Therapeutic activity, 97112- Neuromuscular re-education, 97535- Self Care, 95638- Manual therapy, [phone]- Aquatic Therapy, Patient/Family education, Taping, Dry Needling, Joint mobilization, Spinal mobilization, Scar mobilization, Cryotherapy, Moist heat, and Biofeedback  PLAN FOR NEXT SESSION: pressure management, urge drill, voiding mechanics, breathing mechanics, coordination of pelvic floor and breathing and with activity, abdominal massage  Avie Lemme, PT, DPT 05/14/254:58 PM

## 2023-06-16 ENCOUNTER — Encounter: Payer: Self-pay | Admitting: Physical Therapy

## 2023-06-16 ENCOUNTER — Ambulatory Visit: Payer: Self-pay | Admitting: Physical Therapy

## 2023-06-16 DIAGNOSIS — R279 Unspecified lack of coordination: Secondary | ICD-10-CM | POA: Diagnosis not present

## 2023-06-16 DIAGNOSIS — R293 Abnormal posture: Secondary | ICD-10-CM

## 2023-06-16 DIAGNOSIS — M6281 Muscle weakness (generalized): Secondary | ICD-10-CM

## 2023-06-16 NOTE — Addendum Note (Signed)
 Addended by: Romaine Closs on: 06/16/2023 09:36 AM   Modules accepted: Orders

## 2023-06-16 NOTE — Therapy (Signed)
 OUTPATIENT PHYSICAL THERAPY FEMALE PELVIC TREATMENT   Patient Name: Cassandra Herring MRN: [id number] DOB:[birth year], 78 y.o., female Today's Date: 06/16/2023 Progress Note Reporting Period 11/21/22 to 03/03/23 to 05/08/23 Patient limited in her ability to return from evaluation to 03/03/23 then pt took time between last visit and requested to return due to several questions about pelvic floor and prolapse after seeing gyn   See note below for Objective Data and Assessment of Progress/Goals.     END OF SESSION:  PT End of Session - 06/16/23 1411     Visit Number 8    Date for PT Re-Evaluation 08/07/23    Authorization Type Medicare    Progress Note Due on Visit 14    PT Start Time 1400    PT Stop Time 1443    PT Time Calculation (min) 43 min    Activity Tolerance Patient tolerated treatment well    Behavior During Therapy WFL for tasks assessed/performed                Past Medical History:  Diagnosis Date   Anemia    Basal cell carcinoma    Bursitis    left shoulder    Crohn disease (HCC)    HNP (herniated nucleus pulposus)    Mass    gluteal   Osteopenia    Partial tear of left subscapularis tendon    STD (sexually transmitted disease)    Hx of genital warts   Tendon tear    shoulder   Thyroid  nodule    Vertigo    Vitamin D deficiency    Past Surgical History:  Procedure Laterality Date   BREAST SURGERY     implants-1981, removal-2003   MOHS SURGERY Left 10/27/2020   left side of nose   Patient Active Problem List   Diagnosis Date Noted   Well woman exam with routine gynecological exam 04/29/2023   HSV (herpes simplex virus) anogenital infection 04/29/2023   Pelvic organ prolapse quantification stage 2 cystocele 04/29/2023   Incontinence in female 11/06/2022   Drug reaction 05/01/2020   Crohn's disease with complication (HCC) 09/29/2018   Female climacteric state 11/25/2017   Osteopenia of left hip 11/25/2017   Rosacea 11/24/2017   Thyroid   nodule 06/27/2015   Mass 09/27/2011    PCP: Titus Formosa, MD  REFERRING PROVIDER: Romaine Closs, MD   REFERRING DIAG: R32 (ICD-10-CM) - Incontinence in female  THERAPY DIAG:  Muscle weakness (generalized)  Unspecified lack of coordination  Abnormal posture  Rationale for Evaluation and Treatment: Rehabilitation  ONSET DATE: ~ 1 month  SUBJECTIVE:  SUBJECTIVE STATEMENT: Pt reports that she she has some questions Has to modify for her exercises for her shoulder Does her exercises on the floor Has a squatty potty, has fecal incontinence all morning Trying to incorporate her core engagement to her day to minimize pressures.  Gets wiped out with Remicade  IV  Fluid intake: Yes: 60 oz per day of fluid (water and green tea in morning, makes a juice with vegetable/fruit   PAIN: 05/08/23 no   Eval: Are you having pain? Yes NPRS scale: 6/10 Pain location: low back   Pain type: aching Pain description: intermittent   Aggravating factors: with crohns medication, unsure if weakness of stomach or a side effect of meds, (only in the morning and could be bed) Relieving factors: stretching, walking  PRECAUTIONS: None  RED FLAGS: None   WEIGHT BEARING RESTRICTIONS: No  FALLS:  Has patient fallen in last 6 months? No  LIVING ENVIRONMENT: Lives with: lives with their family Lives in: House/apartment   OCCUPATION: retired  PLOF: Independent  PATIENT GOALS: to improve prolapse, decreased urinary leakage, and fecal leakage  PERTINENT HISTORY:  Crohn's disease, was on HRT for over 20 years, was on estradiol  cream, Osteopenia,   Sexual abuse: No  BOWEL MOVEMENT: Pain with bowel movement: No Type of bowel movement:Type (Bristol Stool Scale) 5-6, Frequency 4-5x daily, and Strain No Fully  empty rectum: Yes: but sometimes feels like she has to go again after up and walking around Leakage: Yes: with looser stool Pads: Yes: cotton ball placed there sometimes Fiber supplement: No  URINATION: Pain with urination: No Fully empty bladder: Yes: feels like she needs to rock to fully empty Stream: Strong Urgency: yes Frequency: sometimes in the morning around every 45 min to hour; then 2 hours Leakage: Urge to void and Walking to the bathroom Pads: No  INTERCOURSE: Pain with intercourse: not painful, does have dryness uses a coconut oil Ability to have vaginal penetration:  Yes:   Climax: not painful Marinoff Scale: 0/3  PREGNANCY: Vaginal deliveries 0 Tearing No C-section deliveries 0 Currently pregnant No  PROLAPSE: None   OBJECTIVE:  Note: Objective measures were completed at Evaluation unless otherwise noted.  DIAGNOSTIC FINDINGS:    COGNITION: Overall cognitive status: Within functional limits for tasks assessed     SENSATION: Light touch: Appears intact Proprioception: Appears intact  MUSCLE LENGTH: Bil hamstrings and adductors limited by 25%  LUMBAR SPECIAL TESTS:  WFL  FUNCTIONAL TESTS:  WFL  GAIT: WFL  POSTURE: rounded shoulders  PELVIC ALIGNMENT:WFL  LUMBARAROM/PROM:  A/PROM A/PROM  eval  Flexion WFL  Extension WFL  Right lateral flexion Limited by 25%  Left lateral flexion Limited by 25%  Right rotation Limited by 25%  Left rotation Limited by 25%   (Blank rows = not tested)  LOWER EXTREMITY ROM:  WFL  LOWER EXTREMITY MMT:  Bil hips grossly 4/5; knees 5/5  PALPATION:   General  mild fascial restrictions in lower abdomen no TTP                External Perineal Exam [facility]                             Internal Pelvic Floor [facility]  Patient confirms identification and approves PT to assess internal pelvic floor and treatment Yes No emotional/communication barriers or cognitive limitation. Patient is motivated to learn.  Patient understands and agrees with treatment goals and plan. PT explains patient will  be examined in standing, sitting, and lying down to see how their muscles and joints work. When they are ready, they will be asked to remove their underwear so PT can examine their perineum. The patient is also given the option of providing their own chaperone as one is not provided in our facility. The patient also has the right and is explained the right to defer or refuse any part of the evaluation or treatment including the internal exam. With the patient's consent, PT will use one gloved finger to gently assess the muscles of the pelvic floor, seeing how well it contracts and relaxes and if there is muscle symmetry. After, the patient will get dressed and PT and patient will discuss exam findings and plan of care. PT and patient discuss plan of care, schedule, attendance policy and HEP activities.  PELVIC MMT:   MMT eval  Vaginal 4/5; 7 reps, 8s  Internal Anal Sphincter   External Anal Sphincter   Puborectalis   Diastasis Recti   (Blank rows = not tested)        TONE: WFL  PROLAPSE: Possible grade 2 anterior vaginal wall laxity with cough in hooklying   TODAY'S TREATMENT:                                                                                                                              DATE:   05/08/23 - reassessment Pt had several questions about prolapse, does remember discussing vaginal wall laxity at PT eval at 11/21/22 however didn't think about this as a "prolapse" until MD diagnosed her with this at recent appointment. POC discussed with pt for pressure management, lifting mechanics, breathing mechanics, and strengthening for core/glutes/hips/pelvic floor for decreased symptoms, to prevent worsening, and improve grade level Re-educated on pelvic propping positions at end of day or when pressure symptoms are present - pt reports she does still have this handout and will incorporate.   Educated on completing 5-10 pelvic floor contractions at normal pace when feeling pressure at vaginal area to improve pelvic floor support and decreased symptoms when standing for over an hour or going on a longer hike.  X10 breathing and pelvic floor coordination in sitting and standing X10 squats with pelvic floor and breathing coordination   4/15 [facility] reed- transverse abdominis breath with ball press supine,20 reps  hip adduction with ball with transverse abdominis breath 20 reps Abd abduction with red theraband 20 reps Shoulder extension with transverse abdominis breath - difficult- pt unable to do d/t shoulder pain bilat  4/22[facility] Review of HEP Modification of exercises Review of progress and goals  06/11/23: HM Reviewed urge drill and HEP and bladder diary  Bridges 2x10 with transverse abdominis activation and exhale 3x10 transverse abdominis activations with exhale - needed max cues for techniques as noted bulging at abdomen pt then able to feel the difference Alt marching in hooklying with transverse abdominis activations and exhale x10  06/16/23:  Education and 2-3 reps of lifestyle specific examples from pt for when to activate pelvic floor and breath and posture cues - pt had several questions about these and all answered with reps for carry over Benefited greatly from cues for "vocal cues" to remind pt to give herself feedback to breath out with standing X15 dead lifts (per pt unable to lift any weights due to shoulder injury) therefore ball or canister from room used for something to retreive.  X15 hip hinges with light item retrieval  X10 body weight squats with light item retrieval  X15 Sit to stands no UE   PATIENT EDUCATION:  Education details: [id number] Person educated: Patient Education method: Explanation, Demonstration, Tactile cues, Verbal cues, and Handouts Education comprehension: verbalized understanding and returned demonstration  HOME EXERCISE  PROGRAM: [id number]  ASSESSMENT:  CLINICAL IMPRESSION: Pt presents for treatment, reports she has needed to have space between sessions due to her medications and how she needs time to recover from it for several days. Pt has been motivated to return for all appointments and does do all recommendations. Pt session focused on breathing mechanics and lifting mechanics as pt reports this is her biggest concern today. Pt would benefit from additional PT to further address deficits.      OBJECTIVE IMPAIRMENTS: decreased activity tolerance, decreased coordination, decreased endurance, decreased strength, increased fascial restrictions, impaired flexibility, improper body mechanics, and postural dysfunction.   ACTIVITY LIMITATIONS: continence  PARTICIPATION LIMITATIONS: community activity  PERSONAL FACTORS: Time since onset of injury/illness/exacerbation , crohns are also affecting patient's functional outcome.   REHAB POTENTIAL: Good  CLINICAL DECISION MAKING: Stable/uncomplicated  EVALUATION COMPLEXITY: Low   GOALS: Goals reviewed with patient? Yes  SHORT TERM GOALS: Target date: 12/18/22  Pt to be I with HEP.  Baseline: Goal status: MET  2.  Pt will have 25% less urgency due to bladder retraining and strengthening  Baseline:  Goal status: MET  3.  Pt to be I with urge drill for decreased urinary leakage  Baseline:  Goal status: MET  4.  Pt to be I with voiding mechanics and abdominal massage for improved bowel habits.  Baseline:  Goal status: MET   LONG TERM GOALS: Target date: 03/24/23  Pt to be I with advanced HEP.  Baseline:  Goal status: on going  2.  Pt will have 50% less urgency due to bladder retraining and strengthening  Baseline:  Goal status:  on going  3.  Pt to report improved time between bladder voids to at least 2.5 hours for improved QOL with decreased urinary frequency.   Baseline:  Goal status:  on going  4.  Pt will report her bowel movements  are complete due to improved bowel habits and evacuation techniques at least 75% of the time.  Baseline:  Goal status:  on going  5.  Pt will be able to functional actions such as walking for 30 mins without leakage  Baseline:  Goal status:  on going  6. Pt to be I with pressure management with functional activities such as squatting with improved management of intraabdominal pressure for  decreased strain at pelvic floor.  Baseline:  Goal status:  on going PLAN:  PT FREQUENCY: 1-2x/week  PT DURATION: 15 sessions  PLANNED INTERVENTIONS: 97110-Therapeutic exercises, 97530- Therapeutic activity, 97112- Neuromuscular re-education, 97535- Self Care, 35573- Manual therapy, [phone]- Aquatic Therapy, Patient/Family education, Taping, Dry Needling, Joint mobilization, Spinal mobilization, Scar mobilization, Cryotherapy, Moist heat, and Biofeedback  PLAN FOR NEXT SESSION: pressure management, urge  drill, voiding mechanics, breathing mechanics, coordination of pelvic floor and breathing and with activity, abdominal massage  Avie Lemme, PT, DPT 05/19/252:49 PM

## 2023-06-18 ENCOUNTER — Ambulatory Visit (HOSPITAL_COMMUNITY)
Admission: RE | Admit: 2023-06-18 | Discharge: 2023-06-18 | Disposition: A | Discharge status: Home / Self Care | Source: Ambulatory Visit | Attending: Gastroenterology | Admitting: Gastroenterology

## 2023-06-18 ENCOUNTER — Encounter: Admitting: Physical Therapy

## 2023-06-18 DIAGNOSIS — K5090 Crohn's disease, unspecified, without complications: Secondary | ICD-10-CM | POA: Insufficient documentation

## 2023-06-18 MED ORDER — INFLIXIMAB 100 MG IV SOLR
200.0000 mg | INTRAVENOUS | Status: DC
  Administered 2023-06-18: 200 mg via INTRAVENOUS
  Filled 2023-06-18: qty 20

## 2023-06-18 MED ORDER — SODIUM CHLORIDE 0.9 % IV SOLN: INTRAVENOUS | Status: DC

## 2023-06-25 ENCOUNTER — Ambulatory Visit: Payer: Self-pay | Admitting: Physical Therapy

## 2023-06-25 DIAGNOSIS — M6281 Muscle weakness (generalized): Secondary | ICD-10-CM

## 2023-06-25 DIAGNOSIS — R279 Unspecified lack of coordination: Secondary | ICD-10-CM

## 2023-06-25 DIAGNOSIS — R293 Abnormal posture: Secondary | ICD-10-CM

## 2023-06-25 NOTE — Therapy (Signed)
 OUTPATIENT PHYSICAL THERAPY FEMALE PELVIC TREATMENT   Patient Name: Cassandra Herring MRN: [id number] DOB:November 20, 1945, 78 y.o., female Today's Date: 06/25/2023 Progress Note Reporting Period 11/21/22 to 03/03/23 to 05/08/23 Patient limited in her ability to return from evaluation to 03/03/23 then pt took time between last visit and requested to return due to several questions about pelvic floor and prolapse after seeing gyn   See note below for Objective Data and Assessment of Progress/Goals.     END OF SESSION:  PT End of Session - 06/25/23 1407     Visit Number 9    Date for PT Re-Evaluation 08/07/23    Authorization Type Medicare    Progress Note Due on Visit 14    PT Start Time 1402    PT Stop Time 1440    PT Time Calculation (min) 38 min    Activity Tolerance Patient tolerated treatment well    Behavior During Therapy WFL for tasks assessed/performed                Past Medical History:  Diagnosis Date   Anemia    Basal cell carcinoma    Bursitis    left shoulder    Crohn disease (HCC)    HNP (herniated nucleus pulposus)    Mass    gluteal   Osteopenia    Partial tear of left subscapularis tendon    STD (sexually transmitted disease)    Hx of genital warts   Tendon tear    shoulder   Thyroid  nodule    Vertigo    Vitamin D deficiency    Past Surgical History:  Procedure Laterality Date   BREAST SURGERY     implants-1981, removal-2003   MOHS SURGERY Left 10/27/2020   left side of nose   Patient Active Problem List   Diagnosis Date Noted   Well woman exam with routine gynecological exam 04/29/2023   HSV (herpes simplex virus) anogenital infection 04/29/2023   Pelvic organ prolapse quantification stage 2 cystocele 04/29/2023   Incontinence in female 11/06/2022   Drug reaction 05/01/2020   Crohn's disease with complication (HCC) 09/29/2018   Female climacteric state 11/25/2017   Osteopenia of left hip 11/25/2017   Rosacea 11/24/2017   Thyroid   nodule 06/27/2015   Mass 09/27/2011    PCP: Titus Formosa, MD  REFERRING PROVIDER: Romaine Closs, MD   REFERRING DIAG: R32 (ICD-10-CM) - Incontinence in female  THERAPY DIAG:  Muscle weakness (generalized)  Unspecified lack of coordination  Abnormal posture  Rationale for Evaluation and Treatment: Rehabilitation  ONSET DATE: ~ 1 month  SUBJECTIVE:  SUBJECTIVE STATEMENT: Has been using step stool for bowel movements and this has helped empty better, more aware of stool emptying. Does still use a cotton ball at rectum in case of leakage and sometimes dry, sometimes more saturated.  Pressure at vaginal opening/prolapse still happening and seems a little worse today and yesterday with recovering from medication.  Urine has been better, has been holding for longer mostly at home. Is still fearful of leakage outside of the home and goes with urges because she doesn't wear pads and does empty bladder when needed without leakage but frequency is 1-2x an hour when out of the home and reports fear of this makes her go more.  Does have fluid IV with medication infusion and did note she needed to empty a few times.   Reports she is very fatigued from medication and would like to do exercises lying down today.  Fluid intake: Yes: 60 oz per day of fluid (water and green tea in morning, makes a juice with vegetable/fruit   PAIN: 5/28   Are you having pain? Yes NPRS scale: 5/10 Pain location: vaginal opening   Pain type: pressure Pain description: intermittent   Aggravating factors: standing a lot, more fatigued with medication today  Relieving factors: propping   PRECAUTIONS: None  RED FLAGS: None   WEIGHT BEARING RESTRICTIONS: No  FALLS:  Has patient fallen in last 6 months? No  LIVING  ENVIRONMENT: Lives with: lives with their family Lives in: House/apartment   OCCUPATION: retired  PLOF: Independent  PATIENT GOALS: to improve prolapse, decreased urinary leakage, and fecal leakage  PERTINENT HISTORY:  Crohn's disease, was on HRT for over 20 years, was on estradiol  cream, Osteopenia,   Sexual abuse: No  BOWEL MOVEMENT: Pain with bowel movement: No Type of bowel movement:Type (Bristol Stool Scale) 5-6, Frequency 4-5x daily, and Strain No Fully empty rectum: Yes: but sometimes feels like she has to go again after up and walking around Leakage: Yes: with looser stool Pads: Yes: cotton ball placed there sometimes Fiber supplement: No  URINATION: Pain with urination: No Fully empty bladder: Yes: feels like she needs to rock to fully empty Stream: Strong Urgency: yes Frequency: sometimes in the morning around every 45 min to hour; then 2 hours Leakage: Urge to void and Walking to the bathroom Pads: No  INTERCOURSE: Pain with intercourse: not painful, does have dryness uses a coconut oil Ability to have vaginal penetration:  Yes:   Climax: not painful Marinoff Scale: 0/3  PREGNANCY: Vaginal deliveries 0 Tearing No C-section deliveries 0 Currently pregnant No  PROLAPSE: None   OBJECTIVE:  Note: Objective measures were completed at Evaluation unless otherwise noted.  DIAGNOSTIC FINDINGS:    COGNITION: Overall cognitive status: Within functional limits for tasks assessed     SENSATION: Light touch: Appears intact Proprioception: Appears intact  MUSCLE LENGTH: Bil hamstrings and adductors limited by 25%  LUMBAR SPECIAL TESTS:  WFL  FUNCTIONAL TESTS:  WFL  GAIT: WFL  POSTURE: rounded shoulders  PELVIC ALIGNMENT:WFL  LUMBARAROM/PROM:  A/PROM A/PROM  eval  Flexion WFL  Extension WFL  Right lateral flexion Limited by 25%  Left lateral flexion Limited by 25%  Right rotation Limited by 25%  Left rotation Limited by 25%   (Blank  rows = not tested)  LOWER EXTREMITY ROM:  WFL  LOWER EXTREMITY MMT:  Bil hips grossly 4/5; knees 5/5  PALPATION:   General  mild fascial restrictions in lower abdomen no TTP  External Perineal Exam [facility]                             Internal Pelvic Floor [facility]  Patient confirms identification and approves PT to assess internal pelvic floor and treatment Yes No emotional/communication barriers or cognitive limitation. Patient is motivated to learn. Patient understands and agrees with treatment goals and plan. PT explains patient will be examined in standing, sitting, and lying down to see how their muscles and joints work. When they are ready, they will be asked to remove their underwear so PT can examine their perineum. The patient is also given the option of providing their own chaperone as one is not provided in our facility. The patient also has the right and is explained the right to defer or refuse any part of the evaluation or treatment including the internal exam. With the patient's consent, PT will use one gloved finger to gently assess the muscles of the pelvic floor, seeing how well it contracts and relaxes and if there is muscle symmetry. After, the patient will get dressed and PT and patient will discuss exam findings and plan of care. PT and patient discuss plan of care, schedule, attendance policy and HEP activities.  PELVIC MMT:   MMT eval  Vaginal 4/5; 7 reps, 8s  Internal Anal Sphincter   External Anal Sphincter   Puborectalis   Diastasis Recti   (Blank rows = not tested)        TONE: WFL  PROLAPSE: Possible grade 2 anterior vaginal wall laxity with cough in hooklying   TODAY'S TREATMENT:                                                                                                                              DATE:    06/11/23: HM Reviewed urge drill and HEP and bladder diary  Bridges 2x10 with transverse abdominis activation and exhale 3x10  transverse abdominis activations with exhale - needed max cues for techniques as noted bulging at abdomen pt then able to feel the difference Alt marching in hooklying with transverse abdominis activations and exhale x10  06/16/23: Education and 2-3 reps of lifestyle specific examples from pt for when to activate pelvic floor and breath and posture cues - pt had several questions about these and all answered with reps for carry over Benefited greatly from cues for "vocal cues" to remind pt to give herself feedback to breath out with standing X15 dead lifts (per pt unable to lift any weights due to shoulder injury) therefore ball or canister from room used for something to retreive.  X15 hip hinges with light item retrieval  X10 body weight squats with light item retrieval  X15 Sit to stands no UE   06/25/23: Pelvic props: X20 diaphragmatic breathing  2x10 transverse abdominis contractions  3x10 pelvic floor contractions 3x10 quick flicks 2x10 isometrics   PATIENT EDUCATION:  Education details:  [id number] Person educated: Patient Education method: Explanation, Demonstration, Tactile cues, Verbal cues, and Handouts Education comprehension: verbalized understanding and returned demonstration  HOME EXERCISE PROGRAM: [id number]  ASSESSMENT:  CLINICAL IMPRESSION: Pt presents for treatment, reports improvement with fecal leakage and urinary incontinence. Urgency has been getting better outside of infusion. Pt session focused on pelvic propped position with pelvic floor exercises complete. Pt limited today with intense fatigue status post medication infusion but did not want to miss appointment, requested to have session in lying down. Pt would benefit from additional PT to further address deficits.      OBJECTIVE IMPAIRMENTS: decreased activity tolerance, decreased coordination, decreased endurance, decreased strength, increased fascial restrictions, impaired flexibility, improper body  mechanics, and postural dysfunction.   ACTIVITY LIMITATIONS: continence  PARTICIPATION LIMITATIONS: community activity  PERSONAL FACTORS: Time since onset of injury/illness/exacerbation , crohns are also affecting patient's functional outcome.   REHAB POTENTIAL: Good  CLINICAL DECISION MAKING: Stable/uncomplicated  EVALUATION COMPLEXITY: Low   GOALS: Goals reviewed with patient? Yes  SHORT TERM GOALS: Target date: 12/18/22  Pt to be I with HEP.  Baseline: Goal status: MET  2.  Pt will have 25% less urgency due to bladder retraining and strengthening  Baseline:  Goal status: MET  3.  Pt to be I with urge drill for decreased urinary leakage  Baseline:  Goal status: MET  4.  Pt to be I with voiding mechanics and abdominal massage for improved bowel habits.  Baseline:  Goal status: MET   LONG TERM GOALS: Target date: 03/24/23  Pt to be I with advanced HEP.  Baseline:  Goal status: on going  2.  Pt will have 50% less urgency due to bladder retraining and strengthening  Baseline:  Goal status:  on going  3.  Pt to report improved time between bladder voids to at least 2.5 hours for improved QOL with decreased urinary frequency.   Baseline:  Goal status:  on going  4.  Pt will report her bowel movements are complete due to improved bowel habits and evacuation techniques at least 75% of the time.  Baseline:  Goal status:  on going  5.  Pt will be able to functional actions such as walking for 30 mins without leakage  Baseline:  Goal status:  on going  6. Pt to be I with pressure management with functional activities such as squatting with improved management of intraabdominal pressure for  decreased strain at pelvic floor.  Baseline:  Goal status:  on going PLAN:  PT FREQUENCY: 1-2x/week  PT DURATION: 15 sessions  PLANNED INTERVENTIONS: 97110-Therapeutic exercises, 97530- Therapeutic activity, 97112- Neuromuscular re-education, 97535- Self Care, [id number]-  Manual therapy, [id number]- Aquatic Therapy, Patient/Family education, Taping, Dry Needling, Joint mobilization, Spinal mobilization, Scar mobilization, Cryotherapy, Moist heat, and Biofeedback  PLAN FOR NEXT SESSION: pressure management, breathing mechanics, coordination of pelvic floor and breathing and with activity Avie Lemme, PT, DPT 05/28/253:06 PM

## 2023-06-28 DIAGNOSIS — E7800 Pure hypercholesterolemia, unspecified: Secondary | ICD-10-CM | POA: Diagnosis not present

## 2023-07-01 ENCOUNTER — Encounter: Admitting: Physical Therapy

## 2023-07-07 ENCOUNTER — Ambulatory Visit: Attending: Obstetrics and Gynecology | Admitting: Physical Therapy

## 2023-07-07 DIAGNOSIS — R293 Abnormal posture: Secondary | ICD-10-CM

## 2023-07-07 DIAGNOSIS — R279 Unspecified lack of coordination: Secondary | ICD-10-CM | POA: Diagnosis not present

## 2023-07-07 DIAGNOSIS — M6281 Muscle weakness (generalized): Secondary | ICD-10-CM

## 2023-07-07 NOTE — Therapy (Signed)
 OUTPATIENT PHYSICAL THERAPY FEMALE PELVIC TREATMENT   Patient Name: Cassandra Herring MRN: [id number] DOB:06-04-1945, 78 y.o., female Today's Date: 07/07/2023    END OF SESSION:  PT End of Session - 07/07/23 0852     Visit Number 10    Date for PT Re-Evaluation 08/07/23    Authorization Type Medicare    Progress Note Due on Visit 14    PT Start Time 0846    PT Stop Time 0926    PT Time Calculation (min) 40 min    Activity Tolerance Patient tolerated treatment well    Behavior During Therapy WFL for tasks assessed/performed                 Past Medical History:  Diagnosis Date   Anemia    Basal cell carcinoma    Bursitis    left shoulder    Crohn disease (HCC)    HNP (herniated nucleus pulposus)    Mass    gluteal   Osteopenia    Partial tear of left subscapularis tendon    STD (sexually transmitted disease)    Hx of genital warts   Tendon tear    shoulder   Thyroid  nodule    Vertigo    Vitamin D deficiency    Past Surgical History:  Procedure Laterality Date   BREAST SURGERY     implants-1981, removal-2003   MOHS SURGERY Left 10/27/2020   left side of nose   Patient Active Problem List   Diagnosis Date Noted   Well woman exam with routine gynecological exam 04/29/2023   HSV (herpes simplex virus) anogenital infection 04/29/2023   Pelvic organ prolapse quantification stage 2 cystocele 04/29/2023   Incontinence in female 11/06/2022   Drug reaction 05/01/2020   Crohn's disease with complication (HCC) 09/29/2018   Female climacteric state 11/25/2017   Osteopenia of left hip 11/25/2017   Rosacea 11/24/2017   Thyroid  nodule 06/27/2015   Mass 09/27/2011    PCP: Titus Formosa, MD  REFERRING PROVIDER: Romaine Closs, MD   REFERRING DIAG: R32 (ICD-10-CM) - Incontinence in female  THERAPY DIAG:  Muscle weakness (generalized)  Unspecified lack of coordination  Abnormal posture  Rationale for Evaluation and Treatment: Rehabilitation  ONSET  DATE: ~ 1 month  SUBJECTIVE:                                                                                                                                                                                           SUBJECTIVE STATEMENT: Reports she does continue to have loose/mushy stool with chrons unable to have extra fiber. Pt does wear a cotton ball at  rectum and states she does fairly consistently have leakage present there when checks. Is able to make it to bathroom without accidents and usually not urgent.  Urine urgency has gotten much better, leakage has also significantly improved.  Pressure/prolapse - has been working on pressure management and pelvic propping and this has been helpful does want to get pessary as well. Does still feel pressure at the end of every day, however is now able to squat without worsening or causing pressure   Fluid intake: Yes: 60 oz per day of fluid (water and green tea in morning, makes a juice with vegetable/fruit   PAIN:  Are you having pain? Yes NPRS scale: 3/10 Pain location: vaginal opening   Pain type: pressure Pain description: intermittent   Aggravating factors: standing a lot, more fatigued with medication today  Relieving factors: propping   PRECAUTIONS: None  RED FLAGS: None   WEIGHT BEARING RESTRICTIONS: No  FALLS:  Has patient fallen in last 6 months? No  LIVING ENVIRONMENT: Lives with: lives with their family Lives in: House/apartment   OCCUPATION: retired  PLOF: Independent  PATIENT GOALS: to improve prolapse, decreased urinary leakage, and fecal leakage  PERTINENT HISTORY:  Crohn's disease, was on HRT for over 20 years, was on estradiol  cream, Osteopenia,   Sexual abuse: No  BOWEL MOVEMENT: Pain with bowel movement: No Type of bowel movement:Type (Bristol Stool Scale) 5-6, Frequency 4-5x daily, and Strain No Fully empty rectum: Yes: but sometimes feels like she has to go again after up and walking  around Leakage: Yes: with looser stool Pads: Yes: cotton ball placed there sometimes Fiber supplement: No  URINATION: Pain with urination: No Fully empty bladder: Yes: feels like she needs to rock to fully empty Stream: Strong Urgency: yes Frequency: sometimes in the morning around every 45 min to hour; then 2 hours Leakage: Urge to void and Walking to the bathroom Pads: No  INTERCOURSE: Pain with intercourse: not painful, does have dryness uses a coconut oil Ability to have vaginal penetration:  Yes:   Climax: not painful Marinoff Scale: 0/3  PREGNANCY: Vaginal deliveries 0 Tearing No C-section deliveries 0 Currently pregnant No  PROLAPSE: None   OBJECTIVE:  Note: Objective measures were completed at Evaluation unless otherwise noted.  DIAGNOSTIC FINDINGS:    COGNITION: Overall cognitive status: Within functional limits for tasks assessed     SENSATION: Light touch: Appears intact Proprioception: Appears intact  MUSCLE LENGTH: Bil hamstrings and adductors limited by 25%  LUMBAR SPECIAL TESTS:  WFL  FUNCTIONAL TESTS:  WFL  GAIT: WFL  POSTURE: rounded shoulders  PELVIC ALIGNMENT:WFL  LUMBARAROM/PROM:  A/PROM A/PROM  eval  Flexion WFL  Extension WFL  Right lateral flexion Limited by 25%  Left lateral flexion Limited by 25%  Right rotation Limited by 25%  Left rotation Limited by 25%   (Blank rows = not tested)  LOWER EXTREMITY ROM:  WFL  LOWER EXTREMITY MMT:  Bil hips grossly 4/5; knees 5/5  PALPATION:   General  mild fascial restrictions in lower abdomen no TTP                External Perineal Exam [facility]                             Internal Pelvic Floor [facility]  Patient confirms identification and approves PT to assess internal pelvic floor and treatment Yes No emotional/communication barriers or cognitive limitation. Patient is motivated to learn.  Patient understands and agrees with treatment goals and plan. PT explains patient will be  examined in standing, sitting, and lying down to see how their muscles and joints work. When they are ready, they will be asked to remove their underwear so PT can examine their perineum. The patient is also given the option of providing their own chaperone as one is not provided in our facility. The patient also has the right and is explained the right to defer or refuse any part of the evaluation or treatment including the internal exam. With the patient's consent, PT will use one gloved finger to gently assess the muscles of the pelvic floor, seeing how well it contracts and relaxes and if there is muscle symmetry. After, the patient will get dressed and PT and patient will discuss exam findings and plan of care. PT and patient discuss plan of care, schedule, attendance policy and HEP activities.  PELVIC MMT:   MMT eval  Vaginal 4/5; 7 reps, 8s  Internal Anal Sphincter   External Anal Sphincter   Puborectalis   Diastasis Recti   (Blank rows = not tested)        TONE: WFL  PROLAPSE: Possible grade 2 anterior vaginal wall laxity with cough in hooklying   TODAY'S TREATMENT:                                                                                                                              DATE:   06/16/23: Education and 2-3 reps of lifestyle specific examples from pt for when to activate pelvic floor and breath and posture cues - pt had several questions about these and all answered with reps for carry over Benefited greatly from cues for "vocal cues" to remind pt to give herself feedback to breath out with standing X15 dead lifts (per pt unable to lift any weights due to shoulder injury) therefore ball or canister from room used for something to retreive.  X15 hip hinges with light item retrieval  X10 body weight squats with light item retrieval  X15 Sit to stands no UE   06/25/23: Pelvic props: X20 diaphragmatic breathing  2x10 transverse abdominis contractions  3x10 pelvic  floor contractions 3x10 quick flicks 2x10 isometrics   07/07/23: Bridges 3x10 exhale + pelvic floor contraction Hooklying green band hip abduction 3x10 + pelvic floor contraction and exhale Hooklying marching green band 3x10 + pelvic floor contraction and exhale X3 bridge+hip abduction +pelvic floor contraction and exhale however pressure at knees felt Hooklying opp hand/knee ball press 2x10 + pelvic floor contraction and exhale  PATIENT EDUCATION:  Education details: [id number] Person educated: Patient Education method: Explanation, Demonstration, Tactile cues, Verbal cues, and Handouts Education comprehension: verbalized understanding and returned demonstration  HOME EXERCISE PROGRAM: [id number]  ASSESSMENT:  CLINICAL IMPRESSION: Pt presents for treatment, reports improvement with fecal leakage and urinary incontinence now almost no urinary incontinence and fecal incontinence contained with cotton ball. Urgency no longer a problem  outside of infusion. Pt session focused on coordination of pelvic floor and breathing with hip and core and pelvic floor strengthening. Pt tolerated well and denied worsening of pressure at end of session. Cues for pressure management to decrease strain at pelvic floor throughout session. Pt would benefit from additional PT to further address deficits.      OBJECTIVE IMPAIRMENTS: decreased activity tolerance, decreased coordination, decreased endurance, decreased strength, increased fascial restrictions, impaired flexibility, improper body mechanics, and postural dysfunction.   ACTIVITY LIMITATIONS: continence  PARTICIPATION LIMITATIONS: community activity  PERSONAL FACTORS: Time since onset of injury/illness/exacerbation , crohns are also affecting patient's functional outcome.   REHAB POTENTIAL: Good  CLINICAL DECISION MAKING: Stable/uncomplicated  EVALUATION COMPLEXITY: Low   GOALS: Goals reviewed with patient? Yes  SHORT TERM GOALS: Target  date: 12/18/22  Pt to be I with HEP.  Baseline: Goal status: MET  2.  Pt will have 25% less urgency due to bladder retraining and strengthening  Baseline:  Goal status: MET  3.  Pt to be I with urge drill for decreased urinary leakage  Baseline:  Goal status: MET  4.  Pt to be I with voiding mechanics and abdominal massage for improved bowel habits.  Baseline:  Goal status: MET   LONG TERM GOALS: Target date: 03/24/23  Pt to be I with advanced HEP.  Baseline:  Goal status: on going  2.  Pt will have 50% less urgency due to bladder retraining and strengthening  Baseline:  Goal status:  on going  3.  Pt to report improved time between bladder voids to at least 2.5 hours for improved QOL with decreased urinary frequency.   Baseline:  Goal status:  on going  4.  Pt will report her bowel movements are complete due to improved bowel habits and evacuation techniques at least 75% of the time.  Baseline:  Goal status:  on going  5.  Pt will be able to functional actions such as walking for 30 mins without leakage  Baseline:  Goal status:  on going  6. Pt to be I with pressure management with functional activities such as squatting with improved management of intraabdominal pressure for  decreased strain at pelvic floor.  Baseline:  Goal status:  on going PLAN:  PT FREQUENCY: 1-2x/week  PT DURATION: 15 sessions  PLANNED INTERVENTIONS: 97110-Therapeutic exercises, 97530- Therapeutic activity, 97112- Neuromuscular re-education, 97535- Self Care, 16109- Manual therapy, [phone]- Aquatic Therapy, Patient/Family education, Taping, Dry Needling, Joint mobilization, Spinal mobilization, Scar mobilization, Cryotherapy, Moist heat, and Biofeedback  PLAN FOR NEXT SESSION: pressure management, breathing mechanics, coordination of pelvic floor and breathing and with activity Avie Lemme, PT, DPT 06/09/259:44 AM

## 2023-07-09 ENCOUNTER — Ambulatory Visit: Admitting: Physical Therapy

## 2023-07-09 DIAGNOSIS — M6281 Muscle weakness (generalized): Secondary | ICD-10-CM | POA: Diagnosis not present

## 2023-07-09 DIAGNOSIS — R293 Abnormal posture: Secondary | ICD-10-CM | POA: Diagnosis not present

## 2023-07-09 DIAGNOSIS — R279 Unspecified lack of coordination: Secondary | ICD-10-CM | POA: Diagnosis not present

## 2023-07-09 NOTE — Therapy (Signed)
 OUTPATIENT PHYSICAL THERAPY FEMALE PELVIC TREATMENT   Patient Name: Cassandra Herring MRN: [id number] DOB:11-26-1945, 78 y.o., female Today's Date: 07/09/2023    END OF SESSION:  PT End of Session - 07/09/23 0932     Visit Number 11    Date for PT Re-Evaluation 08/07/23    Authorization Type Medicare    Progress Note Due on Visit 14    PT Start Time 0930    PT Stop Time 1012    PT Time Calculation (min) 42 min    Activity Tolerance Patient tolerated treatment well    Behavior During Therapy WFL for tasks assessed/performed                 Past Medical History:  Diagnosis Date   Anemia    Basal cell carcinoma    Bursitis    left shoulder    Crohn disease (HCC)    HNP (herniated nucleus pulposus)    Mass    gluteal   Osteopenia    Partial tear of left subscapularis tendon    STD (sexually transmitted disease)    Hx of genital warts   Tendon tear    shoulder   Thyroid  nodule    Vertigo    Vitamin D deficiency    Past Surgical History:  Procedure Laterality Date   BREAST SURGERY     implants-1981, removal-2003   MOHS SURGERY Left 10/27/2020   left side of nose   Patient Active Problem List   Diagnosis Date Noted   Well woman exam with routine gynecological exam 04/29/2023   HSV (herpes simplex virus) anogenital infection 04/29/2023   Pelvic organ prolapse quantification stage 2 cystocele 04/29/2023   Incontinence in female 11/06/2022   Drug reaction 05/01/2020   Crohn's disease with complication (HCC) 09/29/2018   Female climacteric state 11/25/2017   Osteopenia of left hip 11/25/2017   Rosacea 11/24/2017   Thyroid  nodule 06/27/2015   Mass 09/27/2011    PCP: Titus Formosa, MD  REFERRING PROVIDER: Romaine Closs, MD   REFERRING DIAG: R32 (ICD-10-CM) - Incontinence in female  THERAPY DIAG:  Muscle weakness (generalized)  Unspecified lack of coordination  Abnormal posture  Rationale for Evaluation and Treatment:  Rehabilitation  ONSET DATE: ~ 1 month  SUBJECTIVE:                                                                                                                                                                                           SUBJECTIVE STATEMENT: I had no pressure after our last session and this has been a great improvement so far. Does report walking and usually  a lot of standing makes it worse, was bale to stand 1.5-2 hours without symptoms after this.   Fluid intake: Yes: 60 oz per day of fluid (water and green tea in morning, makes a juice with vegetable/fruit   PAIN:  Are you having pain? Yes NPRS scale: 0/10 Pain location: vaginal opening   Pain type: pressure Pain description: intermittent   Aggravating factors: standing a lot, more fatigued with medication today  Relieving factors: propping   PRECAUTIONS: None  RED FLAGS: None   WEIGHT BEARING RESTRICTIONS: No  FALLS:  Has patient fallen in last 6 months? No  LIVING ENVIRONMENT: Lives with: lives with their family Lives in: House/apartment   OCCUPATION: retired  PLOF: Independent  PATIENT GOALS: to improve prolapse, decreased urinary leakage, and fecal leakage  PERTINENT HISTORY:  Crohn's disease, was on HRT for over 20 years, was on estradiol  cream, Osteopenia,   Sexual abuse: No  BOWEL MOVEMENT: Pain with bowel movement: No Type of bowel movement:Type (Bristol Stool Scale) 5-6, Frequency 4-5x daily, and Strain No Fully empty rectum: Yes: but sometimes feels like she has to go again after up and walking around Leakage: Yes: with looser stool Pads: Yes: cotton ball placed there sometimes Fiber supplement: No  URINATION: Pain with urination: No Fully empty bladder: Yes: feels like she needs to rock to fully empty Stream: Strong Urgency: yes Frequency: sometimes in the morning around every 45 min to hour; then 2 hours Leakage: Urge to void and Walking to the bathroom Pads:  No  INTERCOURSE: Pain with intercourse: not painful, does have dryness uses a coconut oil Ability to have vaginal penetration:  Yes:   Climax: not painful Marinoff Scale: 0/3  PREGNANCY: Vaginal deliveries 0 Tearing No C-section deliveries 0 Currently pregnant No  PROLAPSE: None   OBJECTIVE:  Note: Objective measures were completed at Evaluation unless otherwise noted.  DIAGNOSTIC FINDINGS:    COGNITION: Overall cognitive status: Within functional limits for tasks assessed     SENSATION: Light touch: Appears intact Proprioception: Appears intact  MUSCLE LENGTH: Bil hamstrings and adductors limited by 25%  LUMBAR SPECIAL TESTS:  WFL  FUNCTIONAL TESTS:  WFL  GAIT: WFL  POSTURE: rounded shoulders  PELVIC ALIGNMENT:WFL  LUMBARAROM/PROM:  A/PROM A/PROM  eval  Flexion WFL  Extension WFL  Right lateral flexion Limited by 25%  Left lateral flexion Limited by 25%  Right rotation Limited by 25%  Left rotation Limited by 25%   (Blank rows = not tested)  LOWER EXTREMITY ROM:  WFL  LOWER EXTREMITY MMT:  Bil hips grossly 4/5; knees 5/5  PALPATION:   General  mild fascial restrictions in lower abdomen no TTP                External Perineal Exam [facility]                             Internal Pelvic Floor [facility]  Patient confirms identification and approves PT to assess internal pelvic floor and treatment Yes No emotional/communication barriers or cognitive limitation. Patient is motivated to learn. Patient understands and agrees with treatment goals and plan. PT explains patient will be examined in standing, sitting, and lying down to see how their muscles and joints work. When they are ready, they will be asked to remove their underwear so PT can examine their perineum. The patient is also given the option of providing their own chaperone as one is not provided in our  facility. The patient also has the right and is explained the right to defer or refuse any part of  the evaluation or treatment including the internal exam. With the patient's consent, PT will use one gloved finger to gently assess the muscles of the pelvic floor, seeing how well it contracts and relaxes and if there is muscle symmetry. After, the patient will get dressed and PT and patient will discuss exam findings and plan of care. PT and patient discuss plan of care, schedule, attendance policy and HEP activities.  PELVIC MMT:   MMT eval  Vaginal 4/5; 7 reps, 8s  Internal Anal Sphincter   External Anal Sphincter   Puborectalis   Diastasis Recti   (Blank rows = not tested)        TONE: WFL  PROLAPSE: Possible grade 2 anterior vaginal wall laxity with cough in hooklying   TODAY'S TREATMENT:                                                                                                                              DATE:    06/25/23: Pelvic props: X20 diaphragmatic breathing  2x10 transverse abdominis contractions  3x10 pelvic floor contractions 3x10 quick flicks 2x10 isometrics   07/07/23: Bridges 3x10 exhale + pelvic floor contraction Hooklying green band hip abduction 3x10 + pelvic floor contraction and exhale Hooklying marching green band 3x10 + pelvic floor contraction and exhale X3 bridge+hip abduction +pelvic floor contraction and exhale however pressure at knees felt Hooklying opp hand/knee ball press 2x10 + pelvic floor contraction and exhale  07/09/23: Hooklying exhale + pelvic floor contractions 2x10 2x10 bridges + pelvic floor contractions and exhale 2x10 hooklying hip abduction blue band + pelvic floor contractions and exhale 2x10 blue band hooklying alt marching +pelvic floor contractions +exhale Pt educated on updated HEP    PATIENT EDUCATION:  Education details: [id number] Person educated: Patient Education method: Programmer, multimedia, Demonstration, Tactile cues, Verbal cues, and Handouts Education comprehension: verbalized understanding and returned  demonstration  HOME EXERCISE PROGRAM: [id number]  ASSESSMENT:  CLINICAL IMPRESSION: Pt presents for treatment, reports improvement with fecal leakage and urinary incontinence now almost no urinary incontinence and fecal incontinence contained with cotton ball. Urgency no longer a problem outside of infusion. Pt session focused on coordination of pelvic floor and breathing with hip and core and pelvic floor strengthening. Pt tolerated well and denied worsening of pressure at end of session. Cues for pressure management to decrease strain at pelvic floor throughout session but less often than last session. Does report improvement with symptoms after last session with no pressure after standing. Pt would benefit from additional PT to further address deficits.      OBJECTIVE IMPAIRMENTS: decreased activity tolerance, decreased coordination, decreased endurance, decreased strength, increased fascial restrictions, impaired flexibility, improper body mechanics, and postural dysfunction.   ACTIVITY LIMITATIONS: continence  PARTICIPATION LIMITATIONS: community activity  PERSONAL FACTORS: Time since onset of injury/illness/exacerbation , crohns are also affecting patient's functional  outcome.   REHAB POTENTIAL: Good  CLINICAL DECISION MAKING: Stable/uncomplicated  EVALUATION COMPLEXITY: Low   GOALS: Goals reviewed with patient? Yes  SHORT TERM GOALS: Target date: 12/18/22  Pt to be I with HEP.  Baseline: Goal status: MET  2.  Pt will have 25% less urgency due to bladder retraining and strengthening  Baseline:  Goal status: MET  3.  Pt to be I with urge drill for decreased urinary leakage  Baseline:  Goal status: MET  4.  Pt to be I with voiding mechanics and abdominal massage for improved bowel habits.  Baseline:  Goal status: MET   LONG TERM GOALS: Target date: 03/24/23  Pt to be I with advanced HEP.  Baseline:  Goal status: on going  2.  Pt will have 50% less urgency due  to bladder retraining and strengthening  Baseline:  Goal status:  on going  3.  Pt to report improved time between bladder voids to at least 2.5 hours for improved QOL with decreased urinary frequency.   Baseline:  Goal status:  on going  4.  Pt will report her bowel movements are complete due to improved bowel habits and evacuation techniques at least 75% of the time.  Baseline:  Goal status:  on going  5.  Pt will be able to functional actions such as walking for 30 mins without leakage  Baseline:  Goal status:  on going  6. Pt to be I with pressure management with functional activities such as squatting with improved management of intraabdominal pressure for  decreased strain at pelvic floor.  Baseline:  Goal status:  on going PLAN:  PT FREQUENCY: 1-2x/week  PT DURATION: 15 sessions  PLANNED INTERVENTIONS: 97110-Therapeutic exercises, 97530- Therapeutic activity, 97112- Neuromuscular re-education, 97535- Self Care, 32440- Manual therapy, [id number]- Aquatic Therapy, Patient/Family education, Taping, Dry Needling, Joint mobilization, Spinal mobilization, Scar mobilization, Cryotherapy, Moist heat, and Biofeedback  PLAN FOR NEXT SESSION: pressure management, breathing mechanics, coordination of pelvic floor and breathing and with activity Avie Lemme, PT, DPT 07/08/2509:42 AM

## 2023-07-14 ENCOUNTER — Encounter: Admitting: Physical Therapy

## 2023-07-14 DIAGNOSIS — D122 Benign neoplasm of ascending colon: Secondary | ICD-10-CM | POA: Diagnosis not present

## 2023-07-14 DIAGNOSIS — K648 Other hemorrhoids: Secondary | ICD-10-CM | POA: Diagnosis not present

## 2023-07-14 DIAGNOSIS — K5730 Diverticulosis of large intestine without perforation or abscess without bleeding: Secondary | ICD-10-CM | POA: Diagnosis not present

## 2023-07-14 DIAGNOSIS — Z860101 Personal history of adenomatous and serrated colon polyps: Secondary | ICD-10-CM | POA: Diagnosis not present

## 2023-07-14 DIAGNOSIS — K5289 Other specified noninfective gastroenteritis and colitis: Secondary | ICD-10-CM | POA: Diagnosis not present

## 2023-07-14 DIAGNOSIS — K5010 Crohn's disease of large intestine without complications: Secondary | ICD-10-CM | POA: Diagnosis not present

## 2023-07-14 DIAGNOSIS — K6389 Other specified diseases of intestine: Secondary | ICD-10-CM | POA: Diagnosis not present

## 2023-07-16 DIAGNOSIS — D122 Benign neoplasm of ascending colon: Secondary | ICD-10-CM | POA: Diagnosis not present

## 2023-07-16 DIAGNOSIS — K5289 Other specified noninfective gastroenteritis and colitis: Secondary | ICD-10-CM | POA: Diagnosis not present

## 2023-07-28 ENCOUNTER — Ambulatory Visit: Admitting: Physical Therapy

## 2023-07-28 DIAGNOSIS — R293 Abnormal posture: Secondary | ICD-10-CM

## 2023-07-28 DIAGNOSIS — E7800 Pure hypercholesterolemia, unspecified: Secondary | ICD-10-CM | POA: Diagnosis not present

## 2023-07-28 DIAGNOSIS — M6281 Muscle weakness (generalized): Secondary | ICD-10-CM

## 2023-07-28 DIAGNOSIS — R279 Unspecified lack of coordination: Secondary | ICD-10-CM | POA: Diagnosis not present

## 2023-07-28 NOTE — Therapy (Signed)
 OUTPATIENT PHYSICAL THERAPY FEMALE PELVIC TREATMENT   Patient Name: Cassandra Herring MRN: [id number] DOB:November 24, 1945, 78 y.o., female Today's Date: 07/28/2023    END OF SESSION:  PT End of Session - 07/28/23 1106     Visit Number 12    Date for PT Re-Evaluation 08/07/23    Authorization Type Medicare    Progress Note Due on Visit 14    PT Start Time 1103    PT Stop Time 1142    PT Time Calculation (min) 39 min    Activity Tolerance Patient tolerated treatment well    Behavior During Therapy WFL for tasks assessed/performed              Past Medical History:  Diagnosis Date   Anemia    Basal cell carcinoma    Bursitis    left shoulder    Crohn disease (HCC)    HNP (herniated nucleus pulposus)    Mass    gluteal   Osteopenia    Partial tear of left subscapularis tendon    STD (sexually transmitted disease)    Hx of genital warts   Tendon tear    shoulder   Thyroid  nodule    Vertigo    Vitamin D deficiency    Past Surgical History:  Procedure Laterality Date   BREAST SURGERY     implants-1981, removal-2003   MOHS SURGERY Left 10/27/2020   left side of nose   Patient Active Problem List   Diagnosis Date Noted   Well woman exam with routine gynecological exam 04/29/2023   HSV (herpes simplex virus) anogenital infection 04/29/2023   Pelvic organ prolapse quantification stage 2 cystocele 04/29/2023   Incontinence in female 11/06/2022   Drug reaction 05/01/2020   Crohn's disease with complication (HCC) 09/29/2018   Female climacteric state 11/25/2017   Osteopenia of left hip 11/25/2017   Rosacea 11/24/2017   Thyroid  nodule 06/27/2015   Mass 09/27/2011    PCP: Gwenn Shams, MD  REFERRING PROVIDER: Dallie Vera GAILS, MD   REFERRING DIAG: R32 (ICD-10-CM) - Incontinence in female  THERAPY DIAG:  Muscle weakness (generalized)  Abnormal posture  Unspecified lack of coordination  Rationale for Evaluation and Treatment: Rehabilitation  ONSET  DATE: ~ 1 month  SUBJECTIVE:                                                                                                                                                                                           SUBJECTIVE STATEMENT: Reports she has been drinking a lot of watermelon juice, and overall increased fluids and had 3 instances of urinary incontinence with needing to change  pants. And with urge and unable to make it but all instances were with running water going in the kitchen.   Reports she has made an appointment for pessary - reports overall prolapse has been feeling better but still has feeling of it at end of days where she has been standing a lot Has been doing all recommendations with pressure management, HEP, and pelvic props which do help a lot   Fluid intake: Yes: 60 oz per day of fluid (water and green tea in morning, makes a juice with vegetable/fruit   PAIN:  Are you having pain? Yes NPRS scale: 0-1/10 Pain location: low back  Pain type: discomfort Pain description: constant, dull, and aching   Aggravating factors: reports it started with prepping for colonoscopy  Relieving factors: nothing really    PRECAUTIONS: None  RED FLAGS: None   WEIGHT BEARING RESTRICTIONS: No  FALLS:  Has patient fallen in last 6 months? No  LIVING ENVIRONMENT: Lives with: lives with their family Lives in: House/apartment   OCCUPATION: retired  PLOF: Independent  PATIENT GOALS: to improve prolapse, decreased urinary leakage, and fecal leakage  PERTINENT HISTORY:  Crohn's disease, was on HRT for over 20 years, was on estradiol  cream, Osteopenia,   Sexual abuse: No  BOWEL MOVEMENT: Pain with bowel movement: No Type of bowel movement:Type (Bristol Stool Scale) 5-6, Frequency 4-5x daily, and Strain No Fully empty rectum: Yes: but sometimes feels like she has to go again after up and walking around Leakage: Yes: with looser stool Pads: Yes: cotton ball placed there  sometimes Fiber supplement: No  URINATION: Pain with urination: No Fully empty bladder: Yes: feels like she needs to rock to fully empty Stream: Strong Urgency: yes Frequency: sometimes in the morning around every 45 min to hour; then 2 hours Leakage: Urge to void and Walking to the bathroom Pads: No  INTERCOURSE: Pain with intercourse: not painful, does have dryness uses a coconut oil Ability to have vaginal penetration:  Yes:   Climax: not painful Marinoff Scale: 0/3  PREGNANCY: Vaginal deliveries 0 Tearing No C-section deliveries 0 Currently pregnant No  PROLAPSE: None   OBJECTIVE:  Note: Objective measures were completed at Evaluation unless otherwise noted.  DIAGNOSTIC FINDINGS:    COGNITION: Overall cognitive status: Within functional limits for tasks assessed     SENSATION: Light touch: Appears intact Proprioception: Appears intact  MUSCLE LENGTH: Bil hamstrings and adductors limited by 25%  LUMBAR SPECIAL TESTS:  WFL  FUNCTIONAL TESTS:  WFL  GAIT: WFL  POSTURE: rounded shoulders  PELVIC ALIGNMENT:WFL  LUMBARAROM/PROM:  A/PROM A/PROM  eval  Flexion WFL  Extension WFL  Right lateral flexion Limited by 25%  Left lateral flexion Limited by 25%  Right rotation Limited by 25%  Left rotation Limited by 25%   (Blank rows = not tested)  LOWER EXTREMITY ROM:  WFL  LOWER EXTREMITY MMT:  Bil hips grossly 4/5; knees 5/5  PALPATION:   General  mild fascial restrictions in lower abdomen no TTP                External Perineal Exam [facility]                             Internal Pelvic Floor [facility]  Patient confirms identification and approves PT to assess internal pelvic floor and treatment Yes No emotional/communication barriers or cognitive limitation. Patient is motivated to learn. Patient understands and agrees with treatment goals and  plan. PT explains patient will be examined in standing, sitting, and lying down to see how their muscles and  joints work. When they are ready, they will be asked to remove their underwear so PT can examine their perineum. The patient is also given the option of providing their own chaperone as one is not provided in our facility. The patient also has the right and is explained the right to defer or refuse any part of the evaluation or treatment including the internal exam. With the patient's consent, PT will use one gloved finger to gently assess the muscles of the pelvic floor, seeing how well it contracts and relaxes and if there is muscle symmetry. After, the patient will get dressed and PT and patient will discuss exam findings and plan of care. PT and patient discuss plan of care, schedule, attendance policy and HEP activities.  PELVIC MMT:   MMT eval  Vaginal 4/5; 7 reps, 8s  Internal Anal Sphincter   External Anal Sphincter   Puborectalis   Diastasis Recti   (Blank rows = not tested)        TONE: WFL  PROLAPSE: Possible grade 2 anterior vaginal wall laxity with cough in hooklying   TODAY'S TREATMENT:                                                                                                                              DATE:   07/07/23: Kallie 3x10 exhale + pelvic floor contraction Hooklying green band hip abduction 3x10 + pelvic floor contraction and exhale Hooklying marching green band 3x10 + pelvic floor contraction and exhale X3 bridge+hip abduction +pelvic floor contraction and exhale however pressure at knees felt Hooklying opp hand/knee ball press 2x10 + pelvic floor contraction and exhale  07/09/23: Hooklying exhale + pelvic floor contractions 2x10 2x10 bridges + pelvic floor contractions and exhale 2x10 hooklying hip abduction blue band + pelvic floor contractions and exhale 2x10 blue band hooklying alt marching +pelvic floor contractions +exhale Pt educated on updated HEP   07/28/23: Supine 2x10 pelvic floor contractions with exhale, 4x10 quick flicks, x10 10s iso   Bridges with hip abduction 2x10 blue band  Hooklying adductor pulls blue band 2x10 each 2x10 Sit to stands   PATIENT EDUCATION:  Education details: [id number] Person educated: Patient Education method: Programmer, multimedia, Demonstration, Tactile cues, Verbal cues, and Handouts Education comprehension: verbalized understanding and returned demonstration  HOME EXERCISE PROGRAM: [id number]  ASSESSMENT:  CLINICAL IMPRESSION: Pt presents for treatment, reports improvement with fecal leakage and urinary incontinence now almost no urinary incontinence and fecal incontinence contained with cotton ball. Does report improvement overall continuing. Tolerated session well, does have several questions about techniques and coordination throughout session but able to complete all exercises well with minimal cues today.  Pt would benefit from additional PT to further address deficits.      OBJECTIVE IMPAIRMENTS: decreased activity tolerance, decreased coordination, decreased endurance, decreased strength, increased fascial restrictions, impaired flexibility, improper  body mechanics, and postural dysfunction.   ACTIVITY LIMITATIONS: continence  PARTICIPATION LIMITATIONS: community activity  PERSONAL FACTORS: Time since onset of injury/illness/exacerbation , crohns are also affecting patient's functional outcome.   REHAB POTENTIAL: Good  CLINICAL DECISION MAKING: Stable/uncomplicated  EVALUATION COMPLEXITY: Low   GOALS: Goals reviewed with patient? Yes  SHORT TERM GOALS: Target date: 12/18/22  Pt to be I with HEP.  Baseline: Goal status: MET  2.  Pt will have 25% less urgency due to bladder retraining and strengthening  Baseline:  Goal status: MET  3.  Pt to be I with urge drill for decreased urinary leakage  Baseline:  Goal status: MET  4.  Pt to be I with voiding mechanics and abdominal massage for improved bowel habits.  Baseline:  Goal status: MET   LONG TERM GOALS: Target date:  03/24/23  Pt to be I with advanced HEP.  Baseline:  Goal status: on going  2.  Pt will have 50% less urgency due to bladder retraining and strengthening  Baseline:  Goal status:  on going  3.  Pt to report improved time between bladder voids to at least 2.5 hours for improved QOL with decreased urinary frequency.   Baseline:  Goal status:  on going  4.  Pt will report her bowel movements are complete due to improved bowel habits and evacuation techniques at least 75% of the time.  Baseline:  Goal status:  on going  5.  Pt will be able to functional actions such as walking for 30 mins without leakage  Baseline:  Goal status:  on going  6. Pt to be I with pressure management with functional activities such as squatting with improved management of intraabdominal pressure for  decreased strain at pelvic floor.  Baseline:  Goal status:  on going PLAN:  PT FREQUENCY: 1-2x/week  PT DURATION: 15 sessions  PLANNED INTERVENTIONS: 97110-Therapeutic exercises, 97530- Therapeutic activity, 97112- Neuromuscular re-education, 97535- Self Care, [id number]- Manual therapy, [phone]- Aquatic Therapy, Patient/Family education, Taping, Dry Needling, Joint mobilization, Spinal mobilization, Scar mobilization, Cryotherapy, Moist heat, and Biofeedback  PLAN FOR NEXT SESSION: pressure management, breathing mechanics, coordination of pelvic floor and breathing and with activity Darryle Navy, PT, DPT 07/28/2510:28 PM

## 2023-07-30 ENCOUNTER — Ambulatory Visit: Attending: Obstetrics and Gynecology | Admitting: Physical Therapy

## 2023-07-30 DIAGNOSIS — R293 Abnormal posture: Secondary | ICD-10-CM | POA: Insufficient documentation

## 2023-07-30 DIAGNOSIS — R279 Unspecified lack of coordination: Secondary | ICD-10-CM | POA: Diagnosis not present

## 2023-07-30 DIAGNOSIS — M6281 Muscle weakness (generalized): Secondary | ICD-10-CM | POA: Diagnosis not present

## 2023-07-30 NOTE — Patient Instructions (Signed)
 Types of Fiber  There are two main types of fiber:  insoluble and soluble.  Both of these types can prevent and relieve constipation and diarrhea, although some people find one or the other to be more easily digested.  This handout details information about both types of fiber. recommended 25-35 grams of fiber per day,  average 9-12 grams per meal   key is a balance between soluble and insoluble  Insoluble Fiber        Functions of Insoluble Fiber moves bulk through the intestines  controls and balances the pH (acidity) in the intestines   This type of fiber should be avoided or reduced if you have soft, frequent bowel movements or leakage      Benefits of Insoluble Fiber promotes regular bowel movement and prevents constipation  removes fecal waste through colon in less time  keeps an optimal pH in intestines to prevent microbes from producing cancer substances, therefore preventing colon cancer        Food Sources of Insoluble Fiber whole-wheat products  wheat bran "miller's bran" corn bran  flax seed or other seeds vegetables such as green beans, broccoli, cauliflower and potato skins  fruit skins and root vegetable skins  popcorn brown rice  Soluble Fiber (to help bulk stools)       Functions of Soluble Fiber  holds water in the colon to bulk and soften the stool prolongs stomach emptying time so that sugar is released and absorbed more slowly  prevent leakage associated with soft, frequent bowel movements.        Benefits of Soluble Fiber lowers total cholesterol and LDL cholesterol (the bad cholesterol) therefore reducing the risk of heart disease  regulates blood sugar for people with diabetes       Food Sources of Soluble Fiber oat/oat bran dried beans and peas  nuts  barley  flax seed or other seeds fruits such as oranges, pears, peaches, and apples  vegetables such as carrots  psyllium husk  prunes

## 2023-07-30 NOTE — Therapy (Signed)
 OUTPATIENT PHYSICAL THERAPY FEMALE PELVIC TREATMENT   Patient Name: Cassandra Herring MRN: [id number] DOB:1945-09-02, 78 y.o., female Today's Date: 07/30/2023    END OF SESSION:  PT End of Session - 07/30/23 1151     Visit Number 13    Date for PT Re-Evaluation 08/07/23    Authorization Type Medicare    Progress Note Due on Visit 14    PT Start Time 1148    PT Stop Time 1230    PT Time Calculation (min) 42 min    Activity Tolerance Patient tolerated treatment well    Behavior During Therapy WFL for tasks assessed/performed              Past Medical History:  Diagnosis Date   Anemia    Basal cell carcinoma    Bursitis    left shoulder    Crohn disease (HCC)    HNP (herniated nucleus pulposus)    Mass    gluteal   Osteopenia    Partial tear of left subscapularis tendon    STD (sexually transmitted disease)    Hx of genital warts   Tendon tear    shoulder   Thyroid  nodule    Vertigo    Vitamin D deficiency    Past Surgical History:  Procedure Laterality Date   BREAST SURGERY     implants-1981, removal-2003   MOHS SURGERY Left 10/27/2020   left side of nose   Patient Active Problem List   Diagnosis Date Noted   Well woman exam with routine gynecological exam 04/29/2023   HSV (herpes simplex virus) anogenital infection 04/29/2023   Pelvic organ prolapse quantification stage 2 cystocele 04/29/2023   Incontinence in female 11/06/2022   Drug reaction 05/01/2020   Crohn's disease with complication (HCC) 09/29/2018   Female climacteric state 11/25/2017   Osteopenia of left hip 11/25/2017   Rosacea 11/24/2017   Thyroid  nodule 06/27/2015   Mass 09/27/2011    PCP: Gwenn Shams, MD  REFERRING PROVIDER: Dallie Vera GAILS, MD   REFERRING DIAG: R32 (ICD-10-CM) - Incontinence in female  THERAPY DIAG:  Muscle weakness (generalized)  Abnormal posture  Unspecified lack of coordination  Rationale for Evaluation and Treatment: Rehabilitation  ONSET  DATE: ~ 1 month  SUBJECTIVE:                                                                                                                                                                                           SUBJECTIVE STATEMENT: Reports she hasn't had any urinary incontinence since last visit. Pt does report she has been at home so this helps but thinks she is  making it longer than between voids, urge drill is helping.   Fluid intake: Yes: 60 oz per day of fluid (water and green tea in morning, makes a juice with vegetable/fruit   PAIN:  Are you having pain? Yes NPRS scale: 0-1/10 Pain location: low back  Pain type: discomfort Pain description: constant, dull, and aching   Aggravating factors: reports it started with prepping for colonoscopy  Relieving factors: nothing really    PRECAUTIONS: None  RED FLAGS: None   WEIGHT BEARING RESTRICTIONS: No  FALLS:  Has patient fallen in last 6 months? No  LIVING ENVIRONMENT: Lives with: lives with their family Lives in: House/apartment   OCCUPATION: retired  PLOF: Independent  PATIENT GOALS: to improve prolapse, decreased urinary leakage, and fecal leakage  PERTINENT HISTORY:  Crohn's disease, was on HRT for over 20 years, was on estradiol  cream, Osteopenia,   Sexual abuse: No  BOWEL MOVEMENT: Pain with bowel movement: No Type of bowel movement:Type (Bristol Stool Scale) 5-6, Frequency 4-5x daily, and Strain No Fully empty rectum: Yes: but sometimes feels like she has to go again after up and walking around Leakage: Yes: with looser stool Pads: Yes: cotton ball placed there sometimes Fiber supplement: No  URINATION: Pain with urination: No Fully empty bladder: Yes: feels like she needs to rock to fully empty Stream: Strong Urgency: yes Frequency: sometimes in the morning around every 45 min to hour; then 2 hours Leakage: Urge to void and Walking to the bathroom Pads: No  INTERCOURSE: Pain with intercourse:  not painful, does have dryness uses a coconut oil Ability to have vaginal penetration:  Yes:   Climax: not painful Marinoff Scale: 0/3  PREGNANCY: Vaginal deliveries 0 Tearing No C-section deliveries 0 Currently pregnant No  PROLAPSE: None   OBJECTIVE:  Note: Objective measures were completed at Evaluation unless otherwise noted.  DIAGNOSTIC FINDINGS:    COGNITION: Overall cognitive status: Within functional limits for tasks assessed     SENSATION: Light touch: Appears intact Proprioception: Appears intact  MUSCLE LENGTH: Bil hamstrings and adductors limited by 25%  LUMBAR SPECIAL TESTS:  WFL  FUNCTIONAL TESTS:  WFL  GAIT: WFL  POSTURE: rounded shoulders  PELVIC ALIGNMENT:WFL  LUMBARAROM/PROM:  A/PROM A/PROM  eval  Flexion WFL  Extension WFL  Right lateral flexion Limited by 25%  Left lateral flexion Limited by 25%  Right rotation Limited by 25%  Left rotation Limited by 25%   (Blank rows = not tested)  LOWER EXTREMITY ROM:  WFL  LOWER EXTREMITY MMT:  Bil hips grossly 4/5; knees 5/5  PALPATION:   General  mild fascial restrictions in lower abdomen no TTP                External Perineal Exam [facility]                             Internal Pelvic Floor [facility]  Patient confirms identification and approves PT to assess internal pelvic floor and treatment Yes No emotional/communication barriers or cognitive limitation. Patient is motivated to learn. Patient understands and agrees with treatment goals and plan. PT explains patient will be examined in standing, sitting, and lying down to see how their muscles and joints work. When they are ready, they will be asked to remove their underwear so PT can examine their perineum. The patient is also given the option of providing their own chaperone as one is not provided in our facility. The patient also  has the right and is explained the right to defer or refuse any part of the evaluation or treatment including the  internal exam. With the patient's consent, PT will use one gloved finger to gently assess the muscles of the pelvic floor, seeing how well it contracts and relaxes and if there is muscle symmetry. After, the patient will get dressed and PT and patient will discuss exam findings and plan of care. PT and patient discuss plan of care, schedule, attendance policy and HEP activities.  PELVIC MMT:   MMT eval 07/30/23  Vaginal 4/5; 7 reps, 8s 5/5 6s 7 reps  Internal Anal Sphincter    External Anal Sphincter    Puborectalis    Diastasis Recti    (Blank rows = not tested)        TONE: WFL  PROLAPSE: Possible grade 2 anterior vaginal wall laxity with cough in hooklying  07/30/23 - possible grade 1-2 at rest, 2 with cough in hooklying however with cues for pelvic floor contraction improved with less descent with anterior wall laxity  TODAY'S TREATMENT:                                                                                                                              DATE:    07/09/23: Hooklying exhale + pelvic floor contractions 2x10 2x10 bridges + pelvic floor contractions and exhale 2x10 hooklying hip abduction blue band + pelvic floor contractions and exhale 2x10 blue band hooklying alt marching +pelvic floor contractions +exhale Pt educated on updated HEP   07/28/23: Supine 2x10 pelvic floor contractions with exhale, 4x10 quick flicks, x10 10s iso  Bridges with hip abduction 2x10 blue band  Hooklying adductor pulls blue band 2x10 each 2x10 Sit to stands  07/30/23: Patient consented to internal pelvic floor reassessment vaginally this date and found to have improved strength, endurance, and coordination. Patient benefited from verbal cues for improved technique with pelvic floor contractions with coughing to decreased descent of tissue. This improved.  2x10 pelvic floor contractions. 4x10 quick flicks (greatly improved pace), 2x5 10s iso Pt reports she spoke to doctor and MD told pt to  have more than her 7g of fiber per day as pt previously thought this was her recommendation and pt reports MD told her this may help with her remaining fecal leakage.    PATIENT EDUCATION:  Education details: [id number] Person educated: Patient Education method: Explanation, Demonstration, Tactile cues, Verbal cues, and Handouts Education comprehension: verbalized understanding and returned demonstration  HOME EXERCISE PROGRAM: [id number]  ASSESSMENT:  CLINICAL IMPRESSION: Pt presents for treatment, reports improvement with fecal leakage and urinary incontinence now almost no urinary incontinence and fecal incontinence contained with cotton ball. Does report improvement overall continuing. Tolerated session well, with consent given verbally for vaginal pelvic floor reassessment today - improvements noted and progressing toward all goals. Pt would benefit from additional PT to further address deficits.      OBJECTIVE IMPAIRMENTS: decreased activity  tolerance, decreased coordination, decreased endurance, decreased strength, increased fascial restrictions, impaired flexibility, improper body mechanics, and postural dysfunction.   ACTIVITY LIMITATIONS: continence  PARTICIPATION LIMITATIONS: community activity  PERSONAL FACTORS: Time since onset of injury/illness/exacerbation , crohns are also affecting patient's functional outcome.   REHAB POTENTIAL: Good  CLINICAL DECISION MAKING: Stable/uncomplicated  EVALUATION COMPLEXITY: Low   GOALS: Goals reviewed with patient? Yes  SHORT TERM GOALS: Target date: 12/18/22  Pt to be I with HEP.  Baseline: Goal status: MET  2.  Pt will have 25% less urgency due to bladder retraining and strengthening  Baseline:  Goal status: MET  3.  Pt to be I with urge drill for decreased urinary leakage  Baseline:  Goal status: MET  4.  Pt to be I with voiding mechanics and abdominal massage for improved bowel habits.  Baseline:  Goal status:  MET   LONG TERM GOALS: Target date: 03/24/23  Pt to be I with advanced HEP.  Baseline:  Goal status: on going  2.  Pt will have 50% less urgency due to bladder retraining and strengthening  Baseline:  Goal status:  on going  3.  Pt to report improved time between bladder voids to at least 2.5 hours for improved QOL with decreased urinary frequency.   Baseline:  Goal status:  on going  4.  Pt will report her bowel movements are complete due to improved bowel habits and evacuation techniques at least 75% of the time.  Baseline:  Goal status:  on going  5.  Pt will be able to functional actions such as walking for 30 mins without leakage  Baseline:  Goal status:  on going  6. Pt to be I with pressure management with functional activities such as squatting with improved management of intraabdominal pressure for  decreased strain at pelvic floor.  Baseline:  Goal status:  on going PLAN:  PT FREQUENCY: 1-2x/week  PT DURATION: 15 sessions  PLANNED INTERVENTIONS: 97110-Therapeutic exercises, 97530- Therapeutic activity, 97112- Neuromuscular re-education, 97535- Self Care, 02859- Manual therapy, [phone]- Aquatic Therapy, Patient/Family education, Taping, Dry Needling, Joint mobilization, Spinal mobilization, Scar mobilization, Cryotherapy, Moist heat, and Biofeedback  PLAN FOR NEXT SESSION: pressure management, breathing mechanics, coordination of pelvic floor and breathing and with activity Darryle Navy, PT, DPT 07/30/2510:33 PM

## 2023-07-31 ENCOUNTER — Telehealth (HOSPITAL_COMMUNITY): Payer: Self-pay | Admitting: Pharmacy Technician

## 2023-07-31 NOTE — Telephone Encounter (Signed)
 Auth Submission: NO AUTH NEEDED Site of care: Site of care: [facility] Payer: Medicare A/B, Aetna Supp Medication & CPT/J Code(s) submitted: Remicade  (Infliximab ) J1745 Diagnosis Code: K50.90 Route of submission (phone, fax, portal):  Phone # Fax # Auth type: Buy/Bill HB Units/visits requested: 200mg  q 8 weeks Reference number:  Approval from: 07/31/23 to 02/28/24    Medicare A/B will cover 80%, Aetna Supp will cover remaining 20%. Med will be covered at 100%.   Temiloluwa Recchia, CPhT [facility] [phone]

## 2023-08-04 ENCOUNTER — Encounter: Payer: Self-pay | Admitting: Physical Therapy

## 2023-08-04 ENCOUNTER — Ambulatory Visit: Admitting: Physical Therapy

## 2023-08-04 DIAGNOSIS — M6281 Muscle weakness (generalized): Secondary | ICD-10-CM | POA: Diagnosis not present

## 2023-08-04 DIAGNOSIS — R279 Unspecified lack of coordination: Secondary | ICD-10-CM | POA: Diagnosis not present

## 2023-08-04 DIAGNOSIS — R293 Abnormal posture: Secondary | ICD-10-CM

## 2023-08-04 NOTE — Therapy (Signed)
 OUTPATIENT PHYSICAL THERAPY FEMALE PELVIC TREATMENT   Patient Name: Cassandra Herring MRN: [id number] DOB:June 29, 1945, 78 y.o., female Today's Date: 08/04/2023    END OF SESSION:  PT End of Session - 08/04/23 1104     Visit Number 14    Date for PT Re-Evaluation 08/07/23    Authorization Type Medicare    Progress Note Due on Visit 14    PT Start Time 1102    PT Stop Time 1145    PT Time Calculation (min) 43 min    Activity Tolerance Patient tolerated treatment well    Behavior During Therapy WFL for tasks assessed/performed              Past Medical History:  Diagnosis Date   Anemia    Basal cell carcinoma    Bursitis    left shoulder    Crohn disease (HCC)    HNP (herniated nucleus pulposus)    Mass    gluteal   Osteopenia    Partial tear of left subscapularis tendon    STD (sexually transmitted disease)    Hx of genital warts   Tendon tear    shoulder   Thyroid  nodule    Vertigo    Vitamin D deficiency    Past Surgical History:  Procedure Laterality Date   BREAST SURGERY     implants-1981, removal-2003   MOHS SURGERY Left 10/27/2020   left side of nose   Patient Active Problem List   Diagnosis Date Noted   Well woman exam with routine gynecological exam 04/29/2023   HSV (herpes simplex virus) anogenital infection 04/29/2023   Pelvic organ prolapse quantification stage 2 cystocele 04/29/2023   Incontinence in female 11/06/2022   Drug reaction 05/01/2020   Crohn's disease with complication (HCC) 09/29/2018   Female climacteric state 11/25/2017   Osteopenia of left hip 11/25/2017   Rosacea 11/24/2017   Thyroid  nodule 06/27/2015   Mass 09/27/2011    PCP: Gwenn Shams, MD  REFERRING PROVIDER: Dallie Vera GAILS, MD   REFERRING DIAG: R32 (ICD-10-CM) - Incontinence in female  THERAPY DIAG:  Muscle weakness (generalized)  Abnormal posture  Unspecified lack of coordination  Rationale for Evaluation and Treatment: Rehabilitation  ONSET  DATE: ~ 1 month  SUBJECTIVE:                                                                                                                                                                                           SUBJECTIVE STATEMENT: Has been doing pelvic propping daily and this has been helpful so far. Did bladder diary for saturday - has been drinking a lot of  fluids and has had no accidents   Fluid intake: Yes: 60 oz per day of fluid (water and green tea in morning, makes a juice with vegetable/fruit   PAIN:  Are you having pain? Yes NPRS scale: 0-1/10 Pain location: low back  Pain type: discomfort Pain description: constant, dull, and aching   Aggravating factors: reports it started with prepping for colonoscopy  Relieving factors: nothing really    PRECAUTIONS: None  RED FLAGS: None   WEIGHT BEARING RESTRICTIONS: No  FALLS:  Has patient fallen in last 6 months? No  LIVING ENVIRONMENT: Lives with: lives with their family Lives in: House/apartment   OCCUPATION: retired  PLOF: Independent  PATIENT GOALS: to improve prolapse, decreased urinary leakage, and fecal leakage  PERTINENT HISTORY:  Crohn's disease, was on HRT for over 20 years, was on estradiol  cream, Osteopenia,   Sexual abuse: No  BOWEL MOVEMENT: Pain with bowel movement: No Type of bowel movement:Type (Bristol Stool Scale) 5-6, Frequency 4-5x daily, and Strain No Fully empty rectum: Yes: but sometimes feels like she has to go again after up and walking around Leakage: Yes: with looser stool Pads: Yes: cotton ball placed there sometimes Fiber supplement: No  URINATION: Pain with urination: No Fully empty bladder: Yes: feels like she needs to rock to fully empty Stream: Strong Urgency: yes Frequency: sometimes in the morning around every 45 min to hour; then 2 hours Leakage: Urge to void and Walking to the bathroom Pads: No  INTERCOURSE: Pain with intercourse: not painful, does have dryness  uses a coconut oil Ability to have vaginal penetration:  Yes:   Climax: not painful Marinoff Scale: 0/3  PREGNANCY: Vaginal deliveries 0 Tearing No C-section deliveries 0 Currently pregnant No  PROLAPSE: None   OBJECTIVE:  Note: Objective measures were completed at Evaluation unless otherwise noted.  DIAGNOSTIC FINDINGS:    COGNITION: Overall cognitive status: Within functional limits for tasks assessed     SENSATION: Light touch: Appears intact Proprioception: Appears intact  MUSCLE LENGTH: Bil hamstrings and adductors limited by 25%  LUMBAR SPECIAL TESTS:  WFL  FUNCTIONAL TESTS:  WFL  GAIT: WFL  POSTURE: rounded shoulders  PELVIC ALIGNMENT:WFL  LUMBARAROM/PROM:  A/PROM A/PROM  eval  Flexion WFL  Extension WFL  Right lateral flexion Limited by 25%  Left lateral flexion Limited by 25%  Right rotation Limited by 25%  Left rotation Limited by 25%   (Blank rows = not tested)  LOWER EXTREMITY ROM:  WFL  LOWER EXTREMITY MMT:  Bil hips grossly 4/5; knees 5/5  PALPATION:   General  mild fascial restrictions in lower abdomen no TTP                External Perineal Exam [facility]                             Internal Pelvic Floor [facility]  Patient confirms identification and approves PT to assess internal pelvic floor and treatment Yes No emotional/communication barriers or cognitive limitation. Patient is motivated to learn. Patient understands and agrees with treatment goals and plan. PT explains patient will be examined in standing, sitting, and lying down to see how their muscles and joints work. When they are ready, they will be asked to remove their underwear so PT can examine their perineum. The patient is also given the option of providing their own chaperone as one is not provided in our facility. The patient also has the right and  is explained the right to defer or refuse any part of the evaluation or treatment including the internal exam. With the  patient's consent, PT will use one gloved finger to gently assess the muscles of the pelvic floor, seeing how well it contracts and relaxes and if there is muscle symmetry. After, the patient will get dressed and PT and patient will discuss exam findings and plan of care. PT and patient discuss plan of care, schedule, attendance policy and HEP activities.  PELVIC MMT:   MMT eval 07/30/23  Vaginal 4/5; 7 reps, 8s 5/5 6s 7 reps  Internal Anal Sphincter    External Anal Sphincter    Puborectalis    Diastasis Recti    (Blank rows = not tested)        TONE: WFL  PROLAPSE: Possible grade 2 anterior vaginal wall laxity with cough in hooklying  07/30/23 - possible grade 1-2 at rest, 2 with cough in hooklying however with cues for pelvic floor contraction improved with less descent with anterior wall laxity   TODAY'S TREATMENT:                                                                                                                              DATE:    07/09/23: Hooklying exhale + pelvic floor contractions 2x10 2x10 bridges + pelvic floor contractions and exhale 2x10 hooklying hip abduction blue band + pelvic floor contractions and exhale 2x10 blue band hooklying alt marching +pelvic floor contractions +exhale Pt educated on updated HEP   07/28/23: Supine 2x10 pelvic floor contractions with exhale, 4x10 quick flicks, x10 10s iso  Bridges with hip abduction 2x10 blue band  Hooklying adductor pulls blue band 2x10 each 2x10 Sit to stands  07/30/23: Patient consented to internal pelvic floor reassessment vaginally this date and found to have improved strength, endurance, and coordination. Patient benefited from verbal cues for improved technique with pelvic floor contractions with coughing to decreased descent of tissue. This improved.  2x10 pelvic floor contractions. 4x10 quick flicks (greatly improved pace), 2x5 10s iso Pt reports she spoke to doctor and MD told pt to have more than her 7g  of fiber per day as pt previously thought this was her recommendation and pt reports MD told her this may help with her remaining fecal leakage.   08/04/23: Reviewed bladder diary, HEP, pressure management, goals Greatly improved with no leakage since last session, fully emptying bladder and bowels now, doing HEP/pelvic propping, urge drill, understands pressure management Educated on vaginal weights as pt requested additional things for home as needed for strengthening  Also educated to ask MD about types of lubricant recommendations with pessary (pt plans to have that appointment this morning).    PATIENT EDUCATION:  Education details: [id number] Person educated: Patient Education method: Explanation, Demonstration, Tactile cues, Verbal cues, and Handouts Education comprehension: verbalized understanding and returned demonstration  HOME EXERCISE PROGRAM: [id number]  ASSESSMENT:  CLINICAL IMPRESSION: Pt presents for treatment,  reports improvement with fecal leakage and urinary incontinence now almost no urinary incontinence and fecal incontinence contained with cotton ball. Does report improvement overall continuing. Tolerated session well, with consent given verbally for vaginal pelvic floor reassessment today - improvements noted and progressing toward all goals. Pt would benefit from additional PT to further address deficits.      OBJECTIVE IMPAIRMENTS: decreased activity tolerance, decreased coordination, decreased endurance, decreased strength, increased fascial restrictions, impaired flexibility, improper body mechanics, and postural dysfunction.   ACTIVITY LIMITATIONS: continence  PARTICIPATION LIMITATIONS: community activity  PERSONAL FACTORS: Time since onset of injury/illness/exacerbation , crohns are also affecting patient's functional outcome.   REHAB POTENTIAL: Good  CLINICAL DECISION MAKING: Stable/uncomplicated  EVALUATION COMPLEXITY: Low   GOALS: Goals reviewed  with patient? Yes  SHORT TERM GOALS: Target date: 12/18/22  Pt to be I with HEP.  Baseline: Goal status: MET  2.  Pt will have 25% less urgency due to bladder retraining and strengthening  Baseline:  Goal status: MET  3.  Pt to be I with urge drill for decreased urinary leakage  Baseline:  Goal status: MET  4.  Pt to be I with voiding mechanics and abdominal massage for improved bowel habits.  Baseline:  Goal status: MET   LONG TERM GOALS: Target date: 03/24/23  Pt to be I with advanced HEP.  Baseline:  Goal status: MET  2.  Pt will have 50% less urgency due to bladder retraining and strengthening  Baseline:  Goal status:  MET  3.  Pt to report improved time between bladder voids to at least 2.5 hours for improved QOL with decreased urinary frequency.   Baseline:  Goal status:  Met but does have urinary voids quicker than this in the morning with drinking up to 50-70 oz of fluids   4.  Pt will report her bowel movements are complete due to improved bowel habits and evacuation techniques at least 75% of the time.  Baseline:  Goal status:  MET  5.  Pt will be able to functional actions such as walking for 30 mins without leakage  Baseline:  Goal status:  MET  6. Pt to be I with pressure management with functional activities such as squatting with improved management of intraabdominal pressure for  decreased strain at pelvic floor.  Baseline:  Goal status:  MET PLAN:  PT FREQUENCY: 1-2x/week  PT DURATION: 15 sessions  PLANNED INTERVENTIONS: 97110-Therapeutic exercises, 97530- Therapeutic activity, 97112- Neuromuscular re-education, 97535- Self Care, [id number]- Manual therapy, [phone]- Aquatic Therapy, Patient/Family education, Taping, Dry Needling, Joint mobilization, Spinal mobilization, Scar mobilization, Cryotherapy, Moist heat, and Biofeedback  PLAN FOR NEXT SESSION:   PHYSICAL THERAPY DISCHARGE SUMMARY  Visits from Start of Care: 14  Current functional level  related to goals / functional outcomes: All goals    Remaining deficits: Pt does have intermittent prolapse awareness/feeling but I with pressure management and HEP for maintaining this. Does have appt for pessary scheduled this month as well   Education / Equipment: HEP   Patient agrees to discharge. Patient goals were met. Patient is being discharged due to meeting the stated rehab goals.   Darryle Navy, PT, DPT 08/04/2510:38 PM

## 2023-08-04 NOTE — Patient Instructions (Signed)
  Lubrication Used for intercourse to reduce friction Avoid ones that have glycerin, nonoxynol-9, petroleum, propylene glycol, chlorhexidine gluconate, warming gels, tingling gels, icing or cooling gel, scented Avoid parabens due to a preservative similar to female sex hormone May need to be reapplied once or several times during sexual activity Can be applied to both partners genitals prior to vaginal penetration to minimize friction or irritation Prevent irritation and mucosal tears that cause post coital pain and increased the risk of vaginal and urinary tract infections Oil-based lubricants cannot be used with condoms due to breaking them down.  Least likely to irritate vaginal tissue.  Plant based-lubes are safe Silicone-based lubrication are thicker and last long and used for post-menopausal women  Vaginal Lubricators Here is a list of some suggested lubricators you can use for intercourse. Use the most hypoallergenic product.  You can place on you or your partner.  Slippery Stuff ( water based) Sylk or Sliquid Natural H2O ( good  if frequent UTI's)- walmart, amazon Sliquid organics silk-(aloe and silicone based ) [employer] (www.blossom-organics.com)- (aloe based ) Coconut oil, olive oil -not good with condoms  PJur Woman Nude- (water based) amazon Uberlube- ( silicon) Amazon Aloe Vera- Sprouts has an organic one Yes lubricant- (water based and has plant oil based similar to silicone) [employer] Platinum-Silicone, Target, [facility] Olive and Bee intimate cream-  www.oliveandbee.com.au Pink - International [employer] Erosense Sync- walmart, amazon Coconu- coconu.com Desert [employer] Good Clean Love lubricants  Things to avoid in lubricants are glycerin, warming gels, tingling gels, icing or cooling  gels, and scented gels.  Also avoid Vaseline. KY jelly,  and Astroglide contain chlorhexidine which kills good bacteria(lactobacilli)  Things to avoid in the vaginal area Do not use  things to irritate the vulvar area No lotions- see below Soaps you  can use :Aveeno, [place], Good Clean Love cleanser if needed. Must be gentle No deodorants No douches Good to sleep without underwear to let the vaginal area to air out No scrubbing: spread the lips to let warm water rinse over labias and pat dry  Creams that can be used on the Vulva Area V [employer], walmart Vital V [place] Salve Julva- [employer] Botanical Pro-Meno [place] Cream Coconut oil, olive oil Cleo by [employer] labial moisturizer -Amazon,  Desert [place] Releveum ( lidocaine) or Desert [employer] Yes Moisturizer

## 2023-08-19 ENCOUNTER — Ambulatory Visit (HOSPITAL_COMMUNITY)
Admission: RE | Admit: 2023-08-19 | Discharge: 2023-08-19 | Disposition: A | Discharge status: Home / Self Care | Source: Ambulatory Visit | Attending: Gastroenterology | Admitting: Gastroenterology

## 2023-08-19 VITALS — BP 126/76 | HR 85 | Temp 97.7°F | Resp 17 | Wt 110.0 lb

## 2023-08-19 DIAGNOSIS — K50919 Crohn's disease, unspecified, with unspecified complications: Secondary | ICD-10-CM | POA: Insufficient documentation

## 2023-08-19 MED ORDER — DIPHENHYDRAMINE HCL 25 MG PO CAPS: 50.0000 mg | ORAL_CAPSULE | ORAL | Status: DC

## 2023-08-19 MED ORDER — SODIUM CHLORIDE 0.9 % IV SOLN: INTRAVENOUS | Status: DC

## 2023-08-19 MED ORDER — PREDNISONE 20 MG PO TABS: 20.0000 mg | ORAL_TABLET | ORAL | Status: DC

## 2023-08-19 MED ORDER — SODIUM CHLORIDE 0.9 % IV SOLN
200.0000 mg | INTRAVENOUS | Status: DC
  Administered 2023-08-19: 200 mg via INTRAVENOUS
  Filled 2023-08-19: qty 20

## 2023-08-19 MED ORDER — ACETAMINOPHEN 500 MG PO TABS: 500.0000 mg | ORAL_TABLET | ORAL | Status: DC

## 2023-08-22 LAB — QUANTIFERON-TB GOLD PLUS (RQFGPL)
QuantiFERON Mitogen Value: 0.22 [IU]/mL
QuantiFERON Nil Value: 0.02 [IU]/mL
QuantiFERON TB1 Ag Value: 0.05 [IU]/mL
QuantiFERON TB2 Ag Value: 0.03 [IU]/mL

## 2023-08-22 LAB — QUANTIFERON-TB GOLD PLUS: QuantiFERON-TB Gold Plus: UNDETERMINED — AB

## 2023-08-27 ENCOUNTER — Ambulatory Visit (INDEPENDENT_AMBULATORY_CARE_PROVIDER_SITE_OTHER): Admitting: Obstetrics and Gynecology

## 2023-08-27 ENCOUNTER — Encounter: Payer: Self-pay | Admitting: Obstetrics and Gynecology

## 2023-08-27 VITALS — BP 130/76 | HR 78 | Temp 98.3°F | Ht 61.0 in | Wt 111.2 lb

## 2023-08-27 DIAGNOSIS — N8110 Cystocele, unspecified: Secondary | ICD-10-CM | POA: Diagnosis not present

## 2023-08-27 NOTE — Progress Notes (Signed)
 78 y.o. G0P0000 postmenopausal female with Crohn's, osteopenia, pelvic organ prolapse (stage 2), GSM, OAB, vitamin D deficiency here for pessary fitting. Widowed.  H/O Silicone breast implants which ruptured and were removed.  Completed PFPT sessions for urge incontinence Referred to  UROGYN for urodynamics, appt next 09/30/23  GYN HISTORY: No significant history  OB History  Gravida Para Term Preterm AB Living  0 0 0 0 0 0  SAB IAB Ectopic Multiple Live Births  0 0 0 0     Past Medical History:  Diagnosis Date   Anemia    Basal cell carcinoma    Bursitis    left shoulder    Crohn disease (HCC)    HNP (herniated nucleus pulposus)    Mass    gluteal   Osteopenia    Partial tear of left subscapularis tendon    STD (sexually transmitted disease)    Hx of genital warts   Tendon tear    shoulder   Thyroid  nodule    Vertigo    Vitamin D deficiency     Past Surgical History:  Procedure Laterality Date   BREAST SURGERY     implants-1981, removal-2003   MOHS SURGERY Left 10/27/2020   left side of nose    Current Outpatient Medications on File Prior to Visit  Medication Sig Dispense Refill   acetaminophen  (TYLENOL ) 500 MG tablet Take 500 mg by mouth as needed. 1 tab before remicaide treatment     alclomethasone (ACLOVATE) 0.05 % cream Apply 1 application topically 2 (two) times daily.     Ascorbic Acid (VITAMIN C) 1000 MG tablet Take 1,000 mg by mouth daily.     BORON PO Take by mouth.     CALCIUM PO Take by mouth daily.     Cetirizine HCl (ZYRTEC ALLERGY PO) Take by mouth as needed.     COPPER PO Take by mouth.     diphenhydrAMINE  (BENADRYL ) 50 MG capsule Take 50 mg by mouth every 6 (six) hours as needed. 1 tab before Remicade  treatment     estradiol  (ESTRACE ) 0.1 MG/GM vaginal cream Insert 0.5 gram vaginally twice weekly. 42.5 g 2   fexofenadine (ALLEGRA) 180 MG tablet Take 180 mg by mouth daily.     inFLIXimab  (REMICADE ) 100 MG injection Inject 200 mg into the  vein as needed. Every 8-10 weeks     LORazepam (ATIVAN) 0.5 MG tablet Take 0.5 mg by mouth at bedtime.     Lysine 1000 MG TABS Take by mouth.     MAGNESIUM PO Take by mouth.     MANGANESE PO Take 3 mg by mouth.     NON FORMULARY Marine collagen     Omega-3 Fatty Acids (FISH OIL PO) Take 2 capsules by mouth daily.     predniSONE  (DELTASONE ) 20 MG tablet Take 20 mg by mouth daily with breakfast. 1 tablet before and after treatment     tacrolimus (PROTOPIC) 0.1 % ointment Apply topically as needed.     triamcinolone cream (KENALOG) 0.1 % Apply 1 application topically 2 (two) times daily as needed.     UNABLE TO FIND as needed. Med Name: azaleic acid 15%, metronidazole 1%, Ivermectin 1% cream at night     VITAMIN D PO Take 2,200 Int'l Units by mouth.     VITAMIN K PO Take by mouth.     Zinc Sulfate (ZINC 15 PO) Take by mouth.     No current facility-administered medications on file prior to visit.  Allergies  Allergen Reactions   Humira [Adalimumab] Rash    Raised persistent rash around the injection site.   Diphenhydramine      Other Reaction(s): dizziness   Doxepin Hcl     Other Reaction(s): dizziness/nightmares/stomach pain   Other     Benadryl  50mg IV Dizzy, rapid heart rate   Band-aid-redness, rash   Solu-Medrol  [Methylprednisolone  Sodium Succ]     Flush of face, chest, stomach   Methylprednisolone  Rash   Remicade  [Infliximab ] Hives, Rash and Other (See Comments)    Tightening of chest. Per patient pre treatment requires steroids, benadryl  and a low dose of remicade .   Wound Dressing Adhesive Rash    Band Aid & some tapes      PE Today's Vitals   08/27/23 1157  BP: 130/76  Pulse: 78  Temp: 98.3 F (36.8 C)  TempSrc: Oral  SpO2: 98%  Weight: 111 lb 3.2 oz (50.4 kg)  Height: 5' 1 (1.549 m)   Body mass index is 21.01 kg/m.  Physical Exam Vitals reviewed. Exam conducted with a chaperone present.  Constitutional:      General: She is not in acute distress.     Appearance: Normal appearance.  HENT:     Head: Normocephalic and atraumatic.     Nose: Nose normal.  Eyes:     Extraocular Movements: Extraocular movements intact.     Conjunctiva/sclera: Conjunctivae normal.  Neck:     Thyroid : No thyroid  mass, thyromegaly or thyroid  tenderness.  Pulmonary:     Effort: Pulmonary effort is normal.  Abdominal:     General: There is no distension.     Palpations: Abdomen is soft.     Tenderness: There is no abdominal tenderness.  Genitourinary:    General: Normal vulva.     Exam position: Lithotomy position.     Urethra: No prolapse.     Vagina: Prolapsed vaginal walls present. No vaginal discharge or bleeding.     Cervix: Normal. No lesion.     Uterus: Normal. Not enlarged and not tender.      Adnexa: Right adnexa normal and left adnexa normal.     Comments: Stage 2 cystocele, stage 1-2 apical prolapse Musculoskeletal:        General: Normal range of motion.     Cervical back: Normal range of motion.  Lymphadenopathy:     Upper Body:     Right upper body: No axillary adenopathy.     Left upper body: No axillary adenopathy.     Lower Body: No right inguinal adenopathy. No left inguinal adenopathy.  Skin:    General: Skin is warm and dry.  Neurological:     General: No focal deficit present.     Mental Status: She is alert.  Psychiatric:        Mood and Affect: Mood normal.        Behavior: Behavior normal.    Pessary Fitting: After performing speculum exam, fitting was started with #3 ring with support using surgical lubricant, however it did not sit above the pubic bone. Changed to #2 ring w/ supp Pessary remained well positioned with valsalva. Patient was instructed to ambulate and attempt to void (successful). Pessary remained well positioned and was comfortable for the patient. Patient was able to remove pessary. Fitting pessary was removed and order was placed for #2 ring with supp . Patient will be called to return once  available.   Assessment and Plan:        Pelvic organ prolapse quantification  stage 2 cystocele  Successful fitting, RTO for placement after UROdynamics for #2 ring w/ or w/o knob.   Vera LULLA Pa, MD

## 2023-08-27 NOTE — Patient Instructions (Signed)
 Fruits Serving size Fiber (grams per serving) Apple w/skin 1 medium 3.7  Apple w/o skin 1 medium 2.4 Applesauce  cup 2.0 Apricots 3 medium 2.5 Banana 1 medium 2.7 Blueberries (raw) 1 cup 4.0 Cantaloupe 1 cup (pieces) 1.3 Cherries 10 cherries 1.3 Fruit salad/fruit cup  cup 1.3 Grapefruit  medium 1.3 Grapes 1 cup 1.2 Honeydew melon 1 cup (pieces) 1.0 Mandarin oranges  cup 1.0 Nectarine 1 medium 2.2 Orange 1 medium 3.0 Peach 1 medium 1.7 Pear 1 medium 4.0 Pineapple 1 cup (pieces) 2.0 Plum 1 medium 1.0 Prunes (dried) 10 prunes 6.0 Raisins (seedless) 2/3 cup 4.0 Raspberries 1 cup 8.4 Strawberries 1 cup 3.4 Tangerine 1 medium 2.0 Watermelon 1 cup (pieces) 0.8  FIBER IN FOODS CHART Vegetables Serving size Fiber (grams per serving) Artichoke, boiled 1 medium 6.2 Asparagus, boiled  cup (6 spears) 1.4 Baked beans 1 cup 14.0 Broccoli, boiled  cup 2.3 Brussels sprouts, boiled  cup 2.0 Carrot 1 medium 2.0 Cauliflower, boiled  cup 1.7 Celery 1 stalk (7 inch) 0.7 Coleslaw  cup 1.0 Corn, on the cob 1 ear 2.0 Cucumber  cup (slices) 0.5 Eggplant, boiled  cup 1.0 Green beans, boiled  cup 2.0 Lima beans, boiled 1 cup 13.2 Lettuce  cup (pieces) 0.5 Mushrooms  cup (pieces) 0.4 Onions, boiled  cup 1.0 Peas, green  cup 4.0 Pinto beans, boiled 1 cup 14.7 Potato, baked w/ skin 1 medium 5.0 Potato, boiled 1 medium 2.0 Potato salad  cup 1.6 FIBER IN FOODS CHART Vegetables (cont.) Serving size Fiber (grams per serving) Pumpkin, canned  cup 5.0 Spinach, boiled  cup 2.2 Spinach, raw  cup 0.8 Squash, winter  cup 3.0 Sweet potato, baked 1 medium 3.0 Tomato, raw 1 medium 1.0 Cereal Serving size Fiber (grams per serving) All-Bran, Kellogg's  cup 10.0 Alpha-Bits 1 cup 1.0 Banana Nut Crunch 1 cup 4.0 Bran Buds, Kellogg's 1/3 cup 12.0 Cheerios 1 cup 3.0 Corn Pop 1 cup 0.0 Cracklin' Oat Bran, Kellogg's  cup 5.6 Cream of Wheat 1 pack 1.0 Fiber One, [employer] Mills   cup 13.0 Frosted Mini-Wheats 5 biscuits 5.0 Honey Nut Cheerios, [employer] 1 cup 2.0 Instant Oatmeal 1 pack 3.0 Multi-Grain Cheerios 1 cup 3.0 Quaker Shredded Wheat 3 biscuits 7.3 Raisin Bran, [employer]  cup 3.0 Raisin bran, Kellogg's 1 cup 8.2  Breads/Grains Serving size Fiber (grams per serving) Bagel (most bagels) 1 bagel 1.5 English muffin, Thomas 1 muffin 1.5 [place] bread 1 slice 0.5 [place], Bakery Light 1 slice 2.5 Multi-grain 1 slice 1.5 Pancakes 1 medium-large 1.0 Pita, white 1 6" diameter 1.0 Seven grain, Bran'ola 1 slice 3.0 Wheat, Bakery Light 1 slice 2.5 White 1 slice 1.0 Whole wheat 1 slice 2.0  Pasta Serving size Fiber (grams per serving) Elbow macaroni, Golden Grain  cup 2.0 Macaroni 1 cup 1.8 Macaroni, whole wheat 1 cup 4.0 Spaghetti, whole wheat 1 cup 6.3 Brown rice, long grain 1 cup 3.5 White rice 1 cup 1.0

## 2023-08-28 ENCOUNTER — Telehealth: Payer: Self-pay | Admitting: *Deleted

## 2023-08-28 DIAGNOSIS — E7800 Pure hypercholesterolemia, unspecified: Secondary | ICD-10-CM | POA: Diagnosis not present

## 2023-08-28 NOTE — Telephone Encounter (Signed)
 Pessary ordered.  Estimated ship date 2-5 days.

## 2023-08-28 NOTE — Telephone Encounter (Signed)
-----   Message from Vera LULLA Pa sent at 08/27/2023  1:03 PM EDT ----- Please order #2 ring pessary with support.

## 2023-09-01 DIAGNOSIS — Z111 Encounter for screening for respiratory tuberculosis: Secondary | ICD-10-CM | POA: Diagnosis not present

## 2023-09-01 DIAGNOSIS — Z79620 Long term (current) use of immunosuppressive biologic: Secondary | ICD-10-CM | POA: Diagnosis not present

## 2023-09-01 DIAGNOSIS — K5010 Crohn's disease of large intestine without complications: Secondary | ICD-10-CM | POA: Diagnosis not present

## 2023-09-01 NOTE — Telephone Encounter (Signed)
 Spoke with patient, advised patient per Dr. Dallie. Patient verbalizes understanding and is agreeable.   Encounter closed.

## 2023-09-01 NOTE — Telephone Encounter (Signed)
#  2 Pessary ring with support delivered to office.   Spoke with patient. Patient states she is scheduled to see urogyn 9/2 and return to see Dr. Dallie on 9/15. Patient states she was planning to discuss when she returned. Advised will hold pessary until this visit.   Patient states she has been experiencing internal vaginal itching since she left her OV. States she douched after her visit, started using vagisil, used for 3 days,  no relief. Denies odor, discharge or bleeding. Denies any new urinary symptoms.   Patient asking if she needs to return or something be called in?

## 2023-09-02 DIAGNOSIS — K5010 Crohn's disease of large intestine without complications: Secondary | ICD-10-CM | POA: Diagnosis not present

## 2023-09-04 ENCOUNTER — Other Ambulatory Visit (HOSPITAL_COMMUNITY)
Admission: RE | Admit: 2023-09-04 | Discharge: 2023-09-04 | Disposition: A | Discharge status: Home / Self Care | Source: Other Acute Inpatient Hospital | Attending: Obstetrics and Gynecology | Admitting: Obstetrics and Gynecology

## 2023-09-04 ENCOUNTER — Encounter: Payer: Self-pay | Admitting: Obstetrics and Gynecology

## 2023-09-04 ENCOUNTER — Ambulatory Visit (INDEPENDENT_AMBULATORY_CARE_PROVIDER_SITE_OTHER): Admitting: Obstetrics and Gynecology

## 2023-09-04 VITALS — BP 100/65 | HR 72 | Ht 60.05 in | Wt 112.2 lb

## 2023-09-04 DIAGNOSIS — N814 Uterovaginal prolapse, unspecified: Secondary | ICD-10-CM

## 2023-09-04 DIAGNOSIS — N8110 Cystocele, unspecified: Secondary | ICD-10-CM

## 2023-09-04 DIAGNOSIS — R319 Hematuria, unspecified: Secondary | ICD-10-CM | POA: Insufficient documentation

## 2023-09-04 DIAGNOSIS — N952 Postmenopausal atrophic vaginitis: Secondary | ICD-10-CM

## 2023-09-04 DIAGNOSIS — R339 Retention of urine, unspecified: Secondary | ICD-10-CM

## 2023-09-04 DIAGNOSIS — R350 Frequency of micturition: Secondary | ICD-10-CM

## 2023-09-04 LAB — POCT URINALYSIS DIP (CLINITEK)
Bilirubin, UA: NEGATIVE
Glucose, UA: NEGATIVE mg/dL
Ketones, POC UA: NEGATIVE mg/dL
Leukocytes, UA: NEGATIVE
Nitrite, UA: NEGATIVE
POC PROTEIN,UA: NEGATIVE
Spec Grav, UA: 1.01 (ref 1.010–1.025)
Urobilinogen, UA: 0.2 U/dL
pH, UA: 7 (ref 5.0–8.0)

## 2023-09-04 LAB — URINALYSIS, ROUTINE W REFLEX MICROSCOPIC
Bilirubin Urine: NEGATIVE
Glucose, UA: NEGATIVE mg/dL
Hgb urine dipstick: NEGATIVE
Ketones, ur: NEGATIVE mg/dL
Leukocytes,Ua: NEGATIVE
Nitrite: NEGATIVE
Protein, ur: NEGATIVE mg/dL
Specific Gravity, Urine: 1.005 (ref 1.005–1.030)
pH: 7 (ref 5.0–8.0)

## 2023-09-04 NOTE — Patient Instructions (Signed)
 Today we talked about ways to manage bladder urgency such as altering your diet to avoid irritative beverages and foods (bladder diet) as well as attempting to decrease stress and other exacerbating factors.   The Most Bothersome Foods* The Least Bothersome Foods*  Coffee - Regular & Decaf Tea - caffeinated Carbonated beverages - cola, non-colas, diet & caffeine-free Alcohols - Beer, Red Wine, White Wine, [address] - Grapefruit, [place], Orange, [employer] - Cranberry, Grapefruit, Orange, Pineapple Vegetables - Tomato & Tomato Products Flavor Enhancers - Hot peppers, Spicy foods, Chili, Horseradish, Vinegar, Monosodium glutamate (MSG) Artificial Sweeteners - NutraSweet, Sweet 'N Low, Equal (sweetener), Saccharin Ethnic foods - [place], [place], [place] food [employer] - low-fat & whole Fruits - Bananas, Blueberries, Honeydew melon, Pears, Raisins, Watermelon Vegetables - Broccoli, [address] Sprouts, [place], Carrots, Cauliflower, [place], Cucumber, Mushrooms, Peas, Radishes, Squash, Zucchini, White potatoes, Sweet potatoes & yams Poultry - Chicken, Eggs, [place], [employer] - Beef, Diplomatic Services operational officer, Lamb Seafood - Shrimp, [place] fish, Salmon Grains - Oat, Rice Snacks - Pretzels, Popcorn  *Mitch ALF et al. Diet and its role in interstitial cystitis/bladder pain syndrome (IC/BPS) and comorbid conditions. [employer]. BJU Int. 2012 Jan 11.    Continue estrogen x2 weekly

## 2023-09-04 NOTE — Progress Notes (Signed)
 [facility] New Patient Evaluation and Consultation  Referring Provider: Dallie Vera GAILS, MD PCP: Marvene Prentice SAUNDERS, FNP Date of Service: 09/04/2023  SUBJECTIVE Chief Complaint: New Patient (Initial Visit) Cassandra Herring is a 78 y.o. female is here for incontinence, urgency./)  History of Present Illness: Cassandra Herring is a 78 y.o. White or Caucasian female seen in consultation at the request of Dr. Dallie for evaluation of urinary incontinence. Bladder prolapse. Currently doing pelvic floor PT. Has been seeing [facility] for 14 visits.   Review of records significant for: Stage II POP with Dr. Dallie and has been fitted for a pessary with no current pessary use.  Urinary Symptoms: Leaks urine with with urgency Leaks 0-1 time(s) per days.  Pad use: 1 pads per day.   Patient is bothered by UI symptoms.  Day time voids 6-10.  Nocturia: 0-1 times per night to void. Voiding dysfunction:  empties bladder well.  Patient does not use a catheter to empty bladder.  When urinating, patient feels she has no difficulties Drinks: Green tea, homemade vegetable smoothie, and water per day  UTIs: 0 UTI's in the last year.   Denies history of urologic concerns No results found for the last 90 days.   Pelvic Organ Prolapse Symptoms:                  Patient Admits to a feeling of a bulge the vaginal area. It has been present for 3 years.  Patient Denies seeing a bulge.  This bulge is bothersome.  Bowel Symptom: Bowel movements: 2-4 time(s) per day Stool consistency: soft  Straining: no.  Splinting: no.  Incomplete evacuation: yes.  Patient Admits to accidental bowel leakage / fecal incontinence  Occurs: 1 time(s) per day  Consistency with leakage: soft  Bowel regimen: diet Last colonoscopy: Date 2025, Results WNL [facility] Colonoscopy          Completed or No Longer Recommended     Colonoscopy  Discontinued      Frequency changed to Never automatically (Topic No  Longer Applies)   07/14/2023  Outside Claim: PR COLONOSCOPY W/BIOPSY SINGLE/MULTIPLE   Only the first 1 history entries have been loaded, but more history exists.                Sexual Function Sexually active: yes.  Sexual orientation: Straight Pain with sex: No  Pelvic Pain Denies pelvic pain    Past Medical History:  Past Medical History:  Diagnosis Date   Anemia    Basal cell carcinoma    Bursitis    left shoulder    Crohn disease (HCC)    HNP (herniated nucleus pulposus)    Mass    gluteal   Osteopenia    Partial tear of left subscapularis tendon    STD (sexually transmitted disease)    Hx of genital warts   Tendon tear    shoulder   Thyroid  nodule    Vertigo    Vitamin D deficiency      Past Surgical History:   Past Surgical History:  Procedure Laterality Date   BREAST SURGERY     implants-1981, removal-2003   MOHS SURGERY Left 10/27/2020   left side of nose     Past OB/GYN History: G0P0000 Menopausal: Yes, at age 52 Contraception: None. Last pap smear was unknown.  Any history of abnormal pap smears: no. HM PAP   This patient has no relevant Health Maintenance data.  Medications: Patient has a current medication list which includes the following prescription(s): acetaminophen , alclomethasone, vitamin c, boron, calcium, cetirizine hcl, copper, diphenhydramine , estradiol , fexofenadine, infliximab , lorazepam, lysine, magnesium, manganese, NON FORMULARY, omega-3 fatty acids, prednisone , tacrolimus, triamcinolone cream, UNABLE TO FIND, vitamin d, vitamin k, and zinc sulfate.   Allergies: Patient is allergic to humira [adalimumab], diphenhydramine , doxepin hcl, other, solu-medrol  [methylprednisolone  sodium succ], methylprednisolone , remicade  [infliximab ], and wound dressing adhesive.   Social History:  Social History   Tobacco Use   Smoking status: Never   Smokeless tobacco: Never  Vaping Use   Vaping status: Never Used  Substance Use  Topics   Alcohol use: No    Alcohol/week: 0.0 standard drinks of alcohol   Drug use: No    Relationship status: widowed Patient lives alone.   Patient is not employed. Regular exercise: Yes: Walking History of abuse: No  Family History:   Family History  Problem Relation Age of Onset   Lymphoma Father        non-hodkins   Heart attack Father    Cancer Father        skin   Kidney disease Mother    Heart attack Mother    Emphysema Mother    Breast cancer Maternal Aunt      Review of Systems: Review of Systems  Constitutional:  Negative for chills and fever.  Respiratory:  Negative for cough and shortness of breath.   Cardiovascular:  Negative for chest pain and palpitations.  Gastrointestinal:  Negative for abdominal pain, blood in stool, constipation and diarrhea.  Skin:  Negative for rash.  Neurological:  Negative for weakness.  Endo/Heme/Allergies:  Bruises/bleeds easily.  Psychiatric/Behavioral:  Negative for depression and suicidal ideas.      OBJECTIVE Physical Exam: Vitals:   09/04/23 1355  BP: 100/65  Pulse: 72  Weight: 112 lb 3.2 oz (50.9 kg)  Height: 5' 0.05 (1.525 m)    Physical Exam Constitutional:      Appearance: Normal appearance.  Pulmonary:     Effort: Pulmonary effort is normal.  Abdominal:     Palpations: Abdomen is soft.  Neurological:     General: No focal deficit present.     Mental Status: She is alert and oriented to person, place, and time.  Psychiatric:        Mood and Affect: Mood normal.        Behavior: Behavior normal. Behavior is cooperative.        Thought Content: Thought content normal.      GU / Detailed Urogynecologic Evaluation:  Pelvic Exam: Normal external female genitalia; Bartholin's and Skene's glands normal in appearance; urethral meatus normal in appearance, no urethral masses or discharge.   CST: negative  Speculum exam reveals normal vaginal mucosa with atrophy. Cervix normal appearance. Uterus normal  single, nontender. Adnexa normal adnexa.     With apex supported, anterior compartment defect was present  Pelvic floor strength III/V  Pelvic floor musculature: Right levator non-tender, Right obturator non-tender, Left levator non-tender, Left obturator non-tender  POP-Q:   POP-Q  -1.5                                            Aa   -1.5  Ba  -5.5                                              C   3                                            Gh  2.5                                            Pb  9.5                                            tvl   -2.5                                            Ap  -2.5                                            Bp  -7.5                                              D      Rectal Exam:  Normal external exam.  Post-Void Residual (PVR) by Bladder Scan: In order to evaluate bladder emptying, we discussed obtaining a postvoid residual and patient agreed to this procedure.  Procedure: The ultrasound unit was placed on the patient's abdomen in the suprapubic region after the patient had voided.    Post Void Residual - 09/04/23 1550       Post Void Residual   Post Void Residual 250 mL           Laboratory Results: Lab Results  Component Value Date   COLORU yellow 09/04/2023   CLARITYU clear 09/04/2023   GLUCOSEUR negative 09/04/2023   BILIRUBINUR negative 09/04/2023   KETONESU n 02/21/2014   SPECGRAV 1.010 09/04/2023   RBCUR trace-intact (A) 09/04/2023   PHUR 7.0 09/04/2023   PROTEINUR NEGATIVE 11/06/2022   UROBILINOGEN 0.2 09/04/2023   LEUKOCYTESUR Negative 09/04/2023    No results found for: CREATININE  No results found for: HGBA1C  No results found for: HGB   ASSESSMENT AND PLAN [name] is a 78 y.o. with:  1. Urinary frequency   2. Incomplete bladder emptying   3. Vaginal atrophy   4. Prolapse of anterior vaginal wall   5. Uterine prolapse   6.  Hematuria, unspecified type    We discussed the symptoms of overactive bladder (OAB), which include urinary urgency, urinary frequency, nocturia, with or without urge incontinence.  While we do not know the exact etiology of OAB, several treatment options exist. We discussed management including behavioral therapy (decreasing bladder irritants, urge  suppression strategies, timed voids, bladder retraining), physical therapy, medication; for refractory cases posterior tibial nerve stimulation, sacral neuromodulation, and intravesical botulinum toxin injection. For anticholinergic medications, we discussed the potential side effects of anticholinergics including dry eyes, dry mouth, constipation, cognitive impairment and urinary retention. For Beta-3 agonist medication, we discussed the potential side effect of elevated blood pressure which is more likely to occur in individuals with uncontrolled hypertension. Patient will undergo urodynamics first and then we can decide on medication vs. PTNS. Information given on PTNS.  In and out cath PVR revealed in bladder. Patient is interested in urodynamics testing, so we will plan to perform this testing and evaluate patient's bladder function and emptying.  Patient currently using vaginal estrogen cream x2 per week.  Pateint has stage II/IV anterior, I/IV Uterine, and 0-I/IV posterior vaginal prolapse. Patient describes it as feeling more towards the opening. Will see if we can evaluate her bladder emptying more effectively with urodynamics and then can see about fitting her for the previously fitted pessary size if she seems to empty better with prolapse supported.  Not as bothersome as anterior prolapse. Patient also reports that with her decreased immune system response she prefers to steer away from surgical procedures.  Will send urine for culture and micro.   Patient to return for Urodynamics.    Chassidy Layson G Janathan Bribiesca, NP

## 2023-09-05 LAB — URINE CULTURE: Culture: NO GROWTH

## 2023-09-08 ENCOUNTER — Ambulatory Visit: Payer: Self-pay | Admitting: Obstetrics and Gynecology

## 2023-09-08 ENCOUNTER — Encounter: Payer: Self-pay | Admitting: Obstetrics and Gynecology

## 2023-09-22 ENCOUNTER — Ambulatory Visit (INDEPENDENT_AMBULATORY_CARE_PROVIDER_SITE_OTHER)

## 2023-09-22 ENCOUNTER — Ambulatory Visit (INDEPENDENT_AMBULATORY_CARE_PROVIDER_SITE_OTHER): Admitting: Podiatry

## 2023-09-22 ENCOUNTER — Encounter: Payer: Self-pay | Admitting: Podiatry

## 2023-09-22 VITALS — Ht 60.5 in | Wt 112.0 lb

## 2023-09-22 DIAGNOSIS — D492 Neoplasm of unspecified behavior of bone, soft tissue, and skin: Secondary | ICD-10-CM

## 2023-09-22 DIAGNOSIS — M205X9 Other deformities of toe(s) (acquired), unspecified foot: Secondary | ICD-10-CM

## 2023-09-22 DIAGNOSIS — M7671 Peroneal tendinitis, right leg: Secondary | ICD-10-CM | POA: Diagnosis not present

## 2023-09-22 MED ORDER — TRIAMCINOLONE ACETONIDE 10 MG/ML IJ SUSP
10.0000 mg | Freq: Once | INTRAMUSCULAR | Status: AC
  Administered 2023-09-22: 10 mg via INTRA_ARTICULAR

## 2023-09-23 DIAGNOSIS — Z8719 Personal history of other diseases of the digestive system: Secondary | ICD-10-CM | POA: Diagnosis not present

## 2023-09-23 NOTE — Progress Notes (Signed)
 Subjective:   Patient ID: Cassandra Herring, female   DOB: 78 y.o.   MRN: [id number]   HPI Patient states that she has developed a lot of pain on the outside of the right foot and also has a lot of pain in her big toe joint at times stating the left is worse than the right.  States that this has been going on for several months and she does not remember injury.  Patient does not smoke likes to be active   Review of Systems  All other systems reviewed and are negative.       Objective:  Physical Exam Vitals and nursing note reviewed.  Constitutional:      Appearance: She is well-developed.  Pulmonary:     Effort: Pulmonary effort is normal.  Musculoskeletal:        General: Normal range of motion.  Skin:    General: Skin is warm.  Neurological:     Mental Status: She is alert.     Neurovascular status intact muscle strength was found to be adequate range of motion adequate with the patient found to have reduced range of motion of the first MPJ left over right with discomfort around the joint surface and has inflammation pain that is acute in the base of the fifth metatarsal right with fluid buildup at the insertional point.  Patient is found to have good digital perfusion well-oriented x 3     Assessment:  Acute peroneal tendinitis right with inflammation fluid buildup and hallux limitus with structural changes left over right     Plan:  H&P x-rays right taken and discussed possible x-rays of the left at next visit and for the right I discussed the tendon the inflammation and I did a careful injection after explaining risk 3 mg dexamethasone Kenalog  5 mg Xylocaine into the sheath around the base of the fifth metatarsal.  I then applied fascial brace from medial to lateral to lift up the fifth metatarsal and discussed ice therapy and shoe gear modifications.  Reappoint in several weeks to reevaluate results and then to discuss the hallux limitus condition  X-rays indicate  inflammation around the base of the fifth metatarsal but no indications of bony injury with structural hallux limitus spur formation and narrowing of the joint surface first MPJ right

## 2023-09-28 DIAGNOSIS — E7800 Pure hypercholesterolemia, unspecified: Secondary | ICD-10-CM | POA: Diagnosis not present

## 2023-09-30 ENCOUNTER — Ambulatory Visit: Admitting: Obstetrics and Gynecology

## 2023-10-01 DIAGNOSIS — Z6822 Body mass index (BMI) 22.0-22.9, adult: Secondary | ICD-10-CM | POA: Diagnosis not present

## 2023-10-01 DIAGNOSIS — M8580 Other specified disorders of bone density and structure, unspecified site: Secondary | ICD-10-CM | POA: Diagnosis not present

## 2023-10-01 DIAGNOSIS — F5101 Primary insomnia: Secondary | ICD-10-CM | POA: Diagnosis not present

## 2023-10-06 ENCOUNTER — Encounter: Payer: Self-pay | Admitting: Podiatry

## 2023-10-06 ENCOUNTER — Ambulatory Visit (INDEPENDENT_AMBULATORY_CARE_PROVIDER_SITE_OTHER): Admitting: Podiatry

## 2023-10-06 VITALS — Ht 60.05 in | Wt 112.0 lb

## 2023-10-06 DIAGNOSIS — M7671 Peroneal tendinitis, right leg: Secondary | ICD-10-CM | POA: Diagnosis not present

## 2023-10-07 ENCOUNTER — Encounter: Payer: Self-pay | Admitting: Obstetrics and Gynecology

## 2023-10-07 NOTE — Progress Notes (Signed)
 Subjective:   Patient ID: Cassandra Herring Gini Storm, female   DOB: 78 y.o.   MRN: [id number]   HPI Patient presents stating she still has a bubble of inflammation on the outside of the right foot but it is improved over where it was and no longer hurts but she was concerned about appearance.  Also does have reduced range of motion first MPJ mild discomfort   ROS      Objective:  Physical Exam  Neurovascular status intact muscle strength adequate range of motion adequate with significant diminishment of discomfort in the right peroneal insertion with mild discomfort when present but much better than it was.  There still is some swelling around the area which she is calling a bump and she does have mild reduction of motion first MPJ bilateral     Assessment:  Peroneal tendinitis right which appears to be improved with medication currently     Plan:  H&P reviewed she had numerous questions for me that I tried to answer to the best of my ability but I do think that this is a condition which will be relatively stable and I do not think this is going to get worse over time.  I did advise her on gradual increase in activities continuing to use the brace which we had to reeducate her on I do not recommend any treatment for big toe joint discomfort currently.  Patient will be seen back to recheck

## 2023-10-08 ENCOUNTER — Encounter: Payer: Self-pay | Admitting: Obstetrics and Gynecology

## 2023-10-08 ENCOUNTER — Ambulatory Visit (INDEPENDENT_AMBULATORY_CARE_PROVIDER_SITE_OTHER): Admitting: Obstetrics and Gynecology

## 2023-10-08 VITALS — BP 124/56 | HR 68

## 2023-10-08 DIAGNOSIS — R948 Abnormal results of function studies of other organs and systems: Secondary | ICD-10-CM | POA: Diagnosis not present

## 2023-10-08 DIAGNOSIS — N3281 Overactive bladder: Secondary | ICD-10-CM

## 2023-10-08 DIAGNOSIS — N393 Stress incontinence (female) (male): Secondary | ICD-10-CM | POA: Diagnosis not present

## 2023-10-08 DIAGNOSIS — R339 Retention of urine, unspecified: Secondary | ICD-10-CM

## 2023-10-08 DIAGNOSIS — N8110 Cystocele, unspecified: Secondary | ICD-10-CM

## 2023-10-08 DIAGNOSIS — R350 Frequency of micturition: Secondary | ICD-10-CM

## 2023-10-08 DIAGNOSIS — N814 Uterovaginal prolapse, unspecified: Secondary | ICD-10-CM

## 2023-10-08 LAB — POCT URINALYSIS DIP (CLINITEK)
Bilirubin, UA: NEGATIVE
Glucose, UA: NEGATIVE mg/dL
Ketones, POC UA: NEGATIVE mg/dL
Leukocytes, UA: NEGATIVE
Nitrite, UA: NEGATIVE
POC PROTEIN,UA: NEGATIVE
Spec Grav, UA: 1.01 (ref 1.010–1.025)
Urobilinogen, UA: 0.2 U/dL
pH, UA: 7 (ref 5.0–8.0)

## 2023-10-08 NOTE — Progress Notes (Signed)
 [facility] Urodynamics Procedure  Referring Physician: Marvene Prentice SAUNDERS, FNP Date of Procedure: 10/08/2023  Cassandra Herring is a 78 y.o. female who presents for urodynamic evaluation. Indication(s) for study: mixed incontinence and incomplete bladder emptying.   Vital Signs: BP (!) 124/56 (BP Location: Left Arm, Patient Position: Sitting, Cuff Size: Normal)   Pulse 68   LMP 01/29/1999 Comment: sexually active  Laboratory Results: A catheterized urine specimen revealed:  POC urine:  Lab Results  Component Value Date   COLORU yellow 10/08/2023   CLARITYU clear 10/08/2023   GLUCOSEUR negative 10/08/2023   BILIRUBINUR negative 10/08/2023   KETONESU n 02/21/2014   SPECGRAV 1.010 10/08/2023   RBCUR trace-intact (A) 10/08/2023   PHUR 7.0 10/08/2023   PROTEINUR NEGATIVE 09/04/2023   UROBILINOGEN 0.2 10/08/2023   LEUKOCYTESUR Negative 10/08/2023     Voiding Diary: Deferred  Procedure Timeout:  The correct patient was verified and the correct procedure was verified. The patient was in the correct position and safety precautions were reviewed based on at the patient's history.  Urodynamic Procedure A 19F dual lumen urodynamics catheter was placed under sterile conditions into the patient's bladder. A 19F catheter was placed into the rectum in order to measure abdominal pressure. EMG patches were placed in the appropriate position.  All connections were confirmed and calibrations/adjusted made. Saline was instilled into the bladder through the dual lumen catheters.  Cough/valsalva pressures were measured periodically during filling.  Patient was allowed to void.  The bladder was then emptied of its residual.  UROFLOW: Revealed a Qmax of 22 mL/sec.  She voided 227 mL and had a residual of 80 mL.  It was a interrupted pattern and represented normal habits.  CMG: This was performed with sterile water in the sitting position at a fill rate of 30 mL/min.    First  sensation of fullness was 23 mLs,  First urge was 56 mLs,  Strong urge was 163 mLs and  Capacity was 429 mLs  Stress incontinence was demonstrated Highest positive Barrier CLPP was 68 cmH20 at 200 ml. Highest negative Barrier VLPP was 52 cmH20 at 200 ml.  Detrusor function was overactive, with phasic contractions seen.  The first occurred at 63 mL to 2 cm of water and was associated with urge.  Compliance:  Normal. End fill detrusor pressure was 5cmH20.  Calculated compliance was 69mL/cmH20  UPP: MUCP with barrier reduction was 45 cm of water.    MICTURITION STUDY: Voiding was performed with reduction using scopettes in the sitting position.  Pdet at Qmax was 3 cm of water.  Qmax was 20 mL/sec.  It was a interrupted pattern.  She voided 404 mL and had a residual of 25 mL.  It was a volitional void, sustained detrusor contraction was present and abdominal straining was not present  EMG: This was performed with patches.  She had voluntary contractions, recruitment with fill was present and urethral sphincter was not relaxed with void.  The details of the procedure with the study tracings have been scanned into EPIC.   Urodynamic Impression:  1. Sensation was increased; capacity was normal 2. Stress Incontinence was demonstrated at normal pressures; 3. Detrusor Overactivity was demonstrated without leakage. 4. Emptying was dysfunctional with a normal PVR, a sustained detrusor contraction present,  abdominal straining not present, dyssynergic urethral sphincter activity on EMG.  Plan: - The patient will follow up  to discuss the findings and treatment options.  -Patient was fitted with a #2 RWS pessary.  Patient was able to squat, replace pessary, attempt urination and defecation and reports overall she feels it is very supportive. She will follow up in 2 weeks to discuss her OAB symptoms with me and see how the pessary is functioning for her.

## 2023-10-09 ENCOUNTER — Other Ambulatory Visit (HOSPITAL_COMMUNITY): Payer: Self-pay | Admitting: Gastroenterology

## 2023-10-13 ENCOUNTER — Ambulatory Visit: Admitting: Obstetrics and Gynecology

## 2023-10-14 ENCOUNTER — Ambulatory Visit (INDEPENDENT_AMBULATORY_CARE_PROVIDER_SITE_OTHER): Admitting: Obstetrics and Gynecology

## 2023-10-14 ENCOUNTER — Encounter: Payer: Self-pay | Admitting: Obstetrics and Gynecology

## 2023-10-14 VITALS — BP 136/73 | HR 62

## 2023-10-14 DIAGNOSIS — N814 Uterovaginal prolapse, unspecified: Secondary | ICD-10-CM

## 2023-10-14 DIAGNOSIS — N812 Incomplete uterovaginal prolapse: Secondary | ICD-10-CM

## 2023-10-14 DIAGNOSIS — N393 Stress incontinence (female) (male): Secondary | ICD-10-CM

## 2023-10-14 DIAGNOSIS — R339 Retention of urine, unspecified: Secondary | ICD-10-CM

## 2023-10-14 DIAGNOSIS — N8110 Cystocele, unspecified: Secondary | ICD-10-CM

## 2023-10-14 NOTE — Progress Notes (Signed)
 [facility]   Subjective:     Chief Complaint: Follow-up Cassandra Herring is a 78 y.o. female is here for pessary replacement.)  History of Present Illness: Cassandra Herring is a 78 y.o. female with stage II pelvic organ prolapse and stress incontinence who presents today for a pessary fitting.    Past Medical History: Patient  has a past medical history of Anemia, Basal cell carcinoma, Bursitis, Crohn disease (HCC), HNP (herniated nucleus pulposus), Mass, Osteopenia, Partial tear of left subscapularis tendon, STD (sexually transmitted disease), Tendon tear, Thyroid  nodule, Vertigo, and Vitamin D deficiency.   Past Surgical History: She  has a past surgical history that includes Breast surgery and Mohs surgery (Left, 10/27/2020).   Medications: She has a current medication list which includes the following prescription(s): acetaminophen , alclomethasone, vitamin c, boron, calcium, cetirizine hcl, copper, diphenhydramine , estradiol , fexofenadine, infliximab , lorazepam, lysine, magnesium, manganese, NON FORMULARY, omega-3 fatty acids, prednisone , tacrolimus, triamcinolone  cream, UNABLE TO FIND, vitamin d, vitamin k, and zinc sulfate.   Allergies: Patient is allergic to humira [adalimumab], diphenhydramine , doxepin hcl, other, solu-medrol  [methylprednisolone  sodium succ], methylprednisolone , remicade  [infliximab ], and wound dressing adhesive.   Social History: Patient  reports that she has never smoked. She has never used smokeless tobacco. She reports that she does not drink alcohol and does not use drugs.      Objective:    BP 136/73   Pulse 62   LMP 01/29/1999 Comment: sexually active Gen: No apparent distress, A&O x 3. Pelvic Exam: Normal external female genitalia; Bartholin's and Skene's glands normal in appearance; urethral meatus normal in appearance, no urethral masses or discharge.   Attempted a #2 Incontinence dish but the knob was still uncomfortable  for patient and she was unable to pass urine.  A size #1 Cup pessary was fitted. It was comfortable, stayed in place with valsalva and was an appropriate size on examination, with one finger fitting between the pessary and the vaginal walls. Patient was able to urinate without difficulty and able to valsalva and cough without pessary expulsion.    Assessment/Plan:    Assessment: [name] is a 78 y.o. with stage II pelvic organ prolapse and stress incontinence who presents for a pessary fitting. Plan: She was fitted with a #1 cup pessary. She will remove nightly. She will use lubricant.   Follow-up in 4 weeks for a pessary check or sooner as needed.  All questions were answered.    Korianna Washer G Cannon Arreola, NP

## 2023-10-20 ENCOUNTER — Ambulatory Visit (HOSPITAL_COMMUNITY)
Admission: RE | Admit: 2023-10-20 | Discharge: 2023-10-20 | Disposition: A | Discharge status: Home / Self Care | Source: Ambulatory Visit | Attending: Gastroenterology | Admitting: Gastroenterology

## 2023-10-20 VITALS — BP 125/68 | HR 82 | Temp 97.3°F | Resp 16

## 2023-10-20 DIAGNOSIS — K50919 Crohn's disease, unspecified, with unspecified complications: Secondary | ICD-10-CM | POA: Insufficient documentation

## 2023-10-20 MED ORDER — SODIUM CHLORIDE 0.9 % IV SOLN
200.0000 mg | Freq: Once | INTRAVENOUS | Status: AC
  Administered 2023-10-20: 200 mg via INTRAVENOUS
  Filled 2023-10-20: qty 20

## 2023-10-20 MED ORDER — ACETAMINOPHEN 500 MG PO TABS: 500.0000 mg | ORAL_TABLET | Freq: Once | ORAL | Status: DC

## 2023-10-20 MED ORDER — PREDNISONE 20 MG PO TABS: 20.0000 mg | ORAL_TABLET | Freq: Once | ORAL | Status: DC

## 2023-10-20 MED ORDER — DIPHENHYDRAMINE HCL 25 MG PO CAPS: 50.0000 mg | ORAL_CAPSULE | Freq: Once | ORAL | Status: DC

## 2023-10-20 MED ORDER — SODIUM CHLORIDE 0.9 % IV SOLN: INTRAVENOUS | Status: DC

## 2023-10-21 ENCOUNTER — Encounter: Payer: Self-pay | Admitting: Obstetrics and Gynecology

## 2023-10-23 ENCOUNTER — Ambulatory Visit: Admitting: Obstetrics and Gynecology

## 2023-10-28 DIAGNOSIS — E7800 Pure hypercholesterolemia, unspecified: Secondary | ICD-10-CM | POA: Diagnosis not present

## 2023-10-30 ENCOUNTER — Encounter: Payer: Self-pay | Admitting: Obstetrics and Gynecology

## 2023-10-30 NOTE — Telephone Encounter (Signed)
 Spoke with patient. Started taking valacyclovir  1000 ng PO daily on 10/28/23 for HSV breakout. No blisters or open areas of concerns at this point, describes as red spots I in the vagina that are painful. Taking between meals and hydrating.   Reports stomach pain since starting medication. Denies vomiting, N/V, fever, chills. Reports diarrhea every AM. States diarrhea is normal for her, Hx of Chron's Disease.   Has taken valacyclovir  in the past, reports nausea and slight headaches previously.   Scheduled to see UroGYN on Friday for pessary fitting, was hoping to resolve before.  Last dose taken on 10/1.   Advised to hold valacyclovir  for now. I will send to Dr. Dallie for review, our office will f/u with recommendations. Patient agreeable.   Routing to Dr. Dallie

## 2023-10-31 ENCOUNTER — Ambulatory Visit (INDEPENDENT_AMBULATORY_CARE_PROVIDER_SITE_OTHER): Admitting: Obstetrics and Gynecology

## 2023-10-31 ENCOUNTER — Encounter: Payer: Self-pay | Admitting: Obstetrics and Gynecology

## 2023-10-31 VITALS — BP 117/80 | HR 75

## 2023-10-31 DIAGNOSIS — N812 Incomplete uterovaginal prolapse: Secondary | ICD-10-CM

## 2023-10-31 DIAGNOSIS — Z466 Encounter for fitting and adjustment of urinary device: Secondary | ICD-10-CM

## 2023-10-31 DIAGNOSIS — N814 Uterovaginal prolapse, unspecified: Secondary | ICD-10-CM

## 2023-10-31 DIAGNOSIS — R339 Retention of urine, unspecified: Secondary | ICD-10-CM

## 2023-10-31 DIAGNOSIS — N8110 Cystocele, unspecified: Secondary | ICD-10-CM

## 2023-10-31 NOTE — Progress Notes (Signed)
 [facility]   Subjective:     Chief Complaint:  Chief Complaint  Patient presents with   Follow-up    Cassandra Herring is a 78 y.o. female is here for pessary size refit.     History of Present Illness: Cassandra Herring is a 78 y.o. female with stage II pelvic organ prolapse who presents for a pessary check. She is using a size #0 cup pessary. The pessary has been pushing down and falling out and she believes it to be too small. She is using vaginal estrogen. She denies vaginal bleeding.  Past Medical History: Patient  has a past medical history of Anemia, Basal cell carcinoma, Bursitis, Crohn disease (HCC), HNP (herniated nucleus pulposus), Mass, Osteopenia, Partial tear of left subscapularis tendon, STD (sexually transmitted disease), Tendon tear, Thyroid  nodule, Vertigo, and Vitamin D deficiency.   Past Surgical History: She  has a past surgical history that includes Breast surgery and Mohs surgery (Left, 10/27/2020).   Medications: She has a current medication list which includes the following prescription(s): acetaminophen , alclomethasone, vitamin c, boron, calcium, cetirizine hcl, copper, diphenhydramine , estradiol , fexofenadine, infliximab , lorazepam, lysine, magnesium, manganese, NON FORMULARY, omega-3 fatty acids, prednisone , tacrolimus, triamcinolone  cream, UNABLE TO FIND, valacyclovir , vitamin d, vitamin k, and zinc sulfate.   Allergies: Patient is allergic to humira [adalimumab], diphenhydramine , doxepin hcl, other, solu-medrol  [methylprednisolone  sodium succ], methylprednisolone , remicade  [infliximab ], and wound dressing adhesive.   Social History: Patient  reports that she has never smoked. She has never used smokeless tobacco. She reports that she does not drink alcohol and does not use drugs.      Objective:    Physical Exam: BP 117/80   Pulse 75   LMP 01/29/1999 Comment: sexually active Gen: No apparent distress, A&O x 3. Detailed  Urogynecologic Evaluation:  Pelvic Exam: Patient has 3 small areas that she reports are healing HSV ulcers on the right internal labia; Bartholin's and Skene's glands normal in appearance; urethral meatus normal in appearance, no urethral masses or discharge. The pessary was noted to be dislodged as patient had taken it out prior to visit.  Pessary was changed to a #3 Cup with support ([id number]) . Patient reports it is comfortable, fits well, and she was able to urinate and sit as if she were going to have a bowel movement without pessary expulsion.     Assessment/Plan:    Assessment: [name] is a 78 y.o. with stage II pelvic organ prolapse here for a pessary check. She is doing well.  Plan: She will remove daily. She will continue to use estrogen. She will follow-up in 4 weeks for a pessary check or sooner as needed.  All questions were answered.

## 2023-11-03 ENCOUNTER — Telehealth: Payer: Self-pay

## 2023-11-03 ENCOUNTER — Encounter: Payer: Self-pay | Admitting: Obstetrics and Gynecology

## 2023-11-03 NOTE — Telephone Encounter (Signed)
 Patient called stating she is unable to empty her completely with the #3 Pessary cup that was placed on Friday. Patient is able to remove pessary on her on and is then able urinate. I offer her to come in the office today to see Dr. Guadlupe, patient declined it and said she'd take an appt for 10/14 to see Kaitlin. I advised patient to take the pessary out and if it bothers her too much when out and about to placed it in and remove it when she has to urinate. She agreed.

## 2023-11-05 ENCOUNTER — Encounter: Payer: Self-pay | Admitting: Obstetrics and Gynecology

## 2023-11-05 ENCOUNTER — Ambulatory Visit (INDEPENDENT_AMBULATORY_CARE_PROVIDER_SITE_OTHER): Admitting: Obstetrics and Gynecology

## 2023-11-05 ENCOUNTER — Encounter (HOSPITAL_COMMUNITY): Payer: Self-pay | Admitting: Gastroenterology

## 2023-11-05 VITALS — BP 122/60 | HR 63 | Temp 98.1°F | Wt 112.0 lb

## 2023-11-05 DIAGNOSIS — N8110 Cystocele, unspecified: Secondary | ICD-10-CM | POA: Diagnosis not present

## 2023-11-05 NOTE — Progress Notes (Signed)
 78 y.o. G0P0000 postmenopausal female with Crohn's, osteopenia, pelvic organ prolapse (stage 2), GSM, OAB, vitamin D deficiency here for pessary placement. Widowed.  H/O Silicone breast implants which ruptured and were removed.  Completed PFPT sessions for urge incontinence Referred to  UROGYN for urodynamics: has been fitted with #1-3 ring, best fit is with #2 however prior knob caused urinary retention with Dr. Zuleta. Here for placement of #2 ring w/o knob.  GYN HISTORY: No significant history  OB History  Gravida Para Term Preterm AB Living  0 0 0 0 0 0  SAB IAB Ectopic Multiple Live Births  0 0 0 0     Past Medical History:  Diagnosis Date   Anemia    Basal cell carcinoma    Bursitis    left shoulder    Crohn disease (HCC)    HNP (herniated nucleus pulposus)    Mass    gluteal   Osteopenia    Partial tear of left subscapularis tendon    STD (sexually transmitted disease)    Hx of genital warts   Tendon tear    shoulder   Thyroid  nodule    Vertigo    Vitamin D deficiency     Past Surgical History:  Procedure Laterality Date   BREAST SURGERY     implants-1981, removal-2003   MOHS SURGERY Left 10/27/2020   left side of nose    Current Outpatient Medications on File Prior to Visit  Medication Sig Dispense Refill   acetaminophen  (TYLENOL ) 500 MG tablet Take 500 mg by mouth as needed. 1 tab before remicaide treatment     alclomethasone (ACLOVATE) 0.05 % cream Apply 1 application topically 2 (two) times daily.     Ascorbic Acid (VITAMIN C) 1000 MG tablet Take 1,000 mg by mouth daily.     BORON PO Take by mouth.     CALCIUM PO Take by mouth daily.     Cetirizine HCl (ZYRTEC ALLERGY PO) Take by mouth as needed.     COPPER PO Take by mouth.     diphenhydrAMINE  (BENADRYL ) 50 MG capsule Take 50 mg by mouth every 6 (six) hours as needed. 1 tab before Remicade  treatment     estradiol  (ESTRACE ) 0.1 MG/GM vaginal cream Insert 0.5 gram vaginally twice weekly. 42.5 g 2    fexofenadine (ALLEGRA) 180 MG tablet Take 180 mg by mouth daily.     inFLIXimab  (REMICADE ) 100 MG injection Inject 200 mg into the vein as needed. Every 8-10 weeks     LORazepam (ATIVAN) 0.5 MG tablet Take 0.5 mg by mouth at bedtime.     Lysine 1000 MG TABS Take by mouth.     MAGNESIUM PO Take by mouth.     MANGANESE PO Take 3 mg by mouth.     NON FORMULARY Marine collagen     Omega-3 Fatty Acids (FISH OIL PO) Take 2 capsules by mouth daily.     predniSONE  (DELTASONE ) 20 MG tablet Take 20 mg by mouth daily with breakfast. 1 tablet before and after treatment     tacrolimus (PROTOPIC) 0.1 % ointment Apply topically as needed.     triamcinolone  cream (KENALOG ) 0.1 % Apply 1 application topically 2 (two) times daily as needed.     UNABLE TO FIND as needed. Med Name: azaleic acid 15%, metronidazole 1%, Ivermectin 1% cream at night     valACYclovir  (VALTREX ) 1000 MG tablet Take 1,000 mg by mouth 2 (two) times daily as needed (per outbreak).  VITAMIN D PO Take 2,200 Int'l Units by mouth.     VITAMIN K PO Take by mouth.     Zinc Sulfate (ZINC 15 PO) Take by mouth.     No current facility-administered medications on file prior to visit.   Allergies  Allergen Reactions   Humira [Adalimumab] Rash    Raised persistent rash around the injection site.   Diphenhydramine      Other Reaction(s): dizziness   Doxepin Hcl     Other Reaction(s): dizziness/nightmares/stomach pain   Other     Benadryl  50mg IV Dizzy, rapid heart rate   Band-aid-redness, rash   Solu-Medrol  [Methylprednisolone  Sodium Succ]     Flush of face, chest, stomach   Surgilube [K-Y Lubricating] Itching   Methylprednisolone  Rash   Remicade  [Infliximab ] Hives, Rash and Other (See Comments)    Tightening of chest. Per patient pre treatment requires steroids, benadryl  and a low dose of remicade .   Wound Dressing Adhesive Rash    Band Aid & some tapes      PE Today's Vitals   11/05/23 0851  BP: 122/60  Pulse: 63  Temp:  98.1 F (36.7 C)  TempSrc: Oral  SpO2: 98%  Weight: 112 lb (50.8 kg)   Body mass index is 21.84 kg/m.  Physical Exam Vitals reviewed. Exam conducted with a chaperone present.  Constitutional:      General: She is not in acute distress.    Appearance: Normal appearance.  HENT:     Head: Normocephalic and atraumatic.     Nose: Nose normal.  Eyes:     Extraocular Movements: Extraocular movements intact.     Conjunctiva/sclera: Conjunctivae normal.  Neck:     Thyroid : No thyroid  mass, thyromegaly or thyroid  tenderness.  Pulmonary:     Effort: Pulmonary effort is normal.  Abdominal:     General: There is no distension.     Palpations: Abdomen is soft.     Tenderness: There is no abdominal tenderness.  Genitourinary:    General: Normal vulva.     Exam position: Lithotomy position.     Urethra: No prolapse.     Vagina: Prolapsed vaginal walls present. No vaginal discharge or bleeding.     Cervix: Normal. No lesion.     Comments: Stage 2 cystocele, stage 1-2 apical prolapse Musculoskeletal:        General: Normal range of motion.     Cervical back: Normal range of motion.  Skin:    General: Skin is warm and dry.  Neurological:     General: No focal deficit present.     Mental Status: She is alert.  Psychiatric:        Mood and Affect: Mood normal.        Behavior: Behavior normal.    Pessary Fitting: After performing speculum exam, #2 ring with supp was placed without difficulty. Patient able to insert and remove pessary comfortably at home.   Assessment and Plan:        Pelvic organ prolapse quantification stage 2 cystocele -     PR PESSARY, NON RUBBER,ANY TYPE  Has f/u with UROGYN 12/05/23 RTO for annual or PRN  Vera LULLA Pa, MD

## 2023-11-11 ENCOUNTER — Ambulatory Visit: Admitting: Obstetrics and Gynecology

## 2023-11-18 ENCOUNTER — Encounter: Payer: Self-pay | Admitting: Obstetrics and Gynecology

## 2023-11-28 DIAGNOSIS — E7800 Pure hypercholesterolemia, unspecified: Secondary | ICD-10-CM | POA: Diagnosis not present

## 2023-12-04 DIAGNOSIS — E7800 Pure hypercholesterolemia, unspecified: Secondary | ICD-10-CM | POA: Insufficient documentation

## 2023-12-04 DIAGNOSIS — Z85828 Personal history of other malignant neoplasm of skin: Secondary | ICD-10-CM | POA: Insufficient documentation

## 2023-12-04 DIAGNOSIS — K5730 Diverticulosis of large intestine without perforation or abscess without bleeding: Secondary | ICD-10-CM | POA: Insufficient documentation

## 2023-12-04 DIAGNOSIS — F4323 Adjustment disorder with mixed anxiety and depressed mood: Secondary | ICD-10-CM | POA: Insufficient documentation

## 2023-12-04 DIAGNOSIS — F5101 Primary insomnia: Secondary | ICD-10-CM | POA: Insufficient documentation

## 2023-12-05 ENCOUNTER — Ambulatory Visit (INDEPENDENT_AMBULATORY_CARE_PROVIDER_SITE_OTHER): Admitting: Obstetrics and Gynecology

## 2023-12-05 ENCOUNTER — Encounter: Payer: Self-pay | Admitting: Obstetrics and Gynecology

## 2023-12-05 VITALS — BP 120/73 | HR 84 | Ht 60.0 in | Wt 112.0 lb

## 2023-12-05 DIAGNOSIS — R339 Retention of urine, unspecified: Secondary | ICD-10-CM

## 2023-12-05 DIAGNOSIS — I7300 Raynaud's syndrome without gangrene: Secondary | ICD-10-CM | POA: Diagnosis not present

## 2023-12-05 DIAGNOSIS — R350 Frequency of micturition: Secondary | ICD-10-CM

## 2023-12-05 DIAGNOSIS — Z960 Presence of urogenital implants: Secondary | ICD-10-CM

## 2023-12-05 DIAGNOSIS — Z6822 Body mass index (BMI) 22.0-22.9, adult: Secondary | ICD-10-CM | POA: Diagnosis not present

## 2023-12-05 DIAGNOSIS — N812 Incomplete uterovaginal prolapse: Secondary | ICD-10-CM

## 2023-12-05 DIAGNOSIS — N3281 Overactive bladder: Secondary | ICD-10-CM

## 2023-12-05 DIAGNOSIS — N952 Postmenopausal atrophic vaginitis: Secondary | ICD-10-CM

## 2023-12-05 DIAGNOSIS — N8110 Cystocele, unspecified: Secondary | ICD-10-CM

## 2023-12-05 NOTE — Progress Notes (Signed)
 [facility]   Subjective:     Chief Complaint:  Chief Complaint  Patient presents with   Pessary Check    Cassandra Herring is a 78 y.o. female here for pessary check    History of Present Illness: Cassandra Herring is a 78 y.o. female with stage II pelvic organ prolapse and stress incontinence who presents for a pessary check. She is using a size #2 ring with support pessary. The pessary has been working well and she has no complaints. She is using vaginal estrogen. She denies vaginal bleeding.  Past Medical History: Patient  has a past medical history of Anemia, Basal cell carcinoma, Bursitis, Crohn disease (HCC), HNP (herniated nucleus pulposus), Mass, Osteopenia, Partial tear of left subscapularis tendon, STD (sexually transmitted disease), Tendon tear, Thyroid  nodule, Vertigo, and Vitamin D deficiency.   Past Surgical History: She  has a past surgical history that includes Breast surgery and Mohs surgery (Left, 10/27/2020).   Medications: She has a current medication list which includes the following prescription(s): acetaminophen , alclomethasone, vitamin c, boron, calcium, cetirizine hcl, copper, diphenhydramine , estradiol , fexofenadine, infliximab , lorazepam, lysine, magnesium, manganese, NON FORMULARY, omega-3 fatty acids, OVER THE COUNTER MEDICATION, OVER THE COUNTER MEDICATION, prednisone , tacrolimus, triamcinolone  cream, UNABLE TO FIND, valacyclovir , vitamin d, vitamin k, and zinc sulfate.   Allergies: Patient is allergic to humira [adalimumab], diphenhydramine , doxepin hcl, other, solu-medrol  [methylprednisolone  sodium succ], surgilube [k-y lubricating], methylprednisolone , remicade  [infliximab ], and wound dressing adhesive.   Social History: Patient  reports that she has never smoked. She has never used smokeless tobacco. She reports that she does not drink alcohol and does not use drugs.      Objective:    Physical Exam: BP 120/73   Pulse 84    Ht 5' (1.524 m)   Wt 112 lb (50.8 kg)   LMP 01/29/1999 Comment: sexually active  BMI 21.87 kg/m  Gen: No apparent distress, A&O x 3. Detailed Urogynecologic Evaluation:  Pelvic Exam: Normal external female genitalia; Bartholin's and Skene's glands normal in appearance; urethral meatus normal in appearance, no urethral masses or discharge. The pessary was noted to be in place. Speculum exam revealed no lesions in the vagina. Patient has a very small area of the anterior wall still coming down with valsalva and cough. This was shown to her with a mirror.   Patient urinated with pessary in place and urinated 65ml. PVR with bladder scanner done below.    Post Void Residual - 12/05/23 0846       Post Void Residual   Post Void Residual 18 mL          Assessment/Plan:    Assessment: [name] is a 78 y.o. with stage II pelvic organ prolapse and stress incontinence here for a pessary check. She is doing well.  Plan: She will remove every 2 days. She will continue to use estrogen. She will follow-up in 3 months for a pessary check or sooner as needed.   We did discuss complimentary and alternative options for supporting her OAB including pumpkin seed extract and use of PTNS/TENS unit at home. She is considering her options and will look into this. We also discussed reduction in acid consumption and doing tums prior to high acid cosnumption to decrease the amount of OAB spasms that occur.

## 2023-12-05 NOTE — Patient Instructions (Signed)
 You can use pumpkin seed extract up to 5gm (5000mg ) per day.   If you are looking into supplement options do not get anything with saw palmetto.   Today we talked about ways to manage bladder urgency such as altering your diet to avoid irritative beverages and foods (bladder diet) as well as attempting to decrease stress and other exacerbating factors.  You can also chew a plain Tums 1-3 times per day to make your urine less acidic, especially if you have eating/drinking acidic things.    The Most Bothersome Foods* The Least Bothersome Foods*  Coffee - Regular & Decaf Tea - caffeinated Carbonated beverages - cola, non-colas, diet & caffeine-free Alcohols - Beer, Red Wine, White Wine, [address] - Grapefruit, [place], Orange, [employer] - Cranberry, Grapefruit, Orange, Pineapple Vegetables - Tomato & Tomato Products Flavor Enhancers - Hot peppers, Spicy foods, Chili, Horseradish, Vinegar, Monosodium glutamate (MSG) Artificial Sweeteners - NutraSweet, Sweet 'N Low, Equal (sweetener), Saccharin Ethnic foods - Mexican, Thai, Indian food [employer] - low-fat & whole Fruits - Bananas, Blueberries, Honeydew melon, Pears, Raisins, Watermelon Vegetables - Broccoli, [address] Sprouts, [place], Carrots, Cauliflower, [place], Cucumber, Mushrooms, Peas, Radishes, Squash, Zucchini, White potatoes, Sweet potatoes & yams Poultry - Chicken, Eggs, Turkey, [employer] - Beef, [employer] Operational [employer], Lamb Seafood - Shrimp, [place] fish, Salmon Grains - Oat, Rice Snacks - Pretzels, Popcorn  *Mitch ALF et al. Diet and its role in interstitial cystitis/bladder pain syndrome (IC/BPS) and comorbid conditions. [employer]. BJU Int. 2012 Jan 11.

## 2023-12-14 ENCOUNTER — Encounter: Payer: Self-pay | Admitting: Obstetrics and Gynecology

## 2023-12-18 ENCOUNTER — Encounter (HOSPITAL_COMMUNITY): Payer: Self-pay

## 2023-12-18 ENCOUNTER — Ambulatory Visit (HOSPITAL_COMMUNITY)
Admission: RE | Admit: 2023-12-18 | Discharge: 2023-12-18 | Disposition: A | Discharge status: Home / Self Care | Source: Ambulatory Visit | Attending: Gastroenterology | Admitting: Gastroenterology

## 2023-12-18 VITALS — BP 119/62 | HR 80 | Temp 98.6°F | Resp 16 | Wt 110.0 lb

## 2023-12-18 DIAGNOSIS — K50919 Crohn's disease, unspecified, with unspecified complications: Secondary | ICD-10-CM | POA: Insufficient documentation

## 2023-12-18 HISTORY — DX: Raynaud's syndrome without gangrene: I73.00

## 2023-12-18 MED ORDER — PREDNISONE 20 MG PO TABS: 20.0000 mg | ORAL_TABLET | Freq: Once | ORAL | Status: DC

## 2023-12-18 MED ORDER — SODIUM CHLORIDE 0.9 % IV SOLN
200.0000 mg | Freq: Once | INTRAVENOUS | Status: AC
  Administered 2023-12-18: 200 mg via INTRAVENOUS
  Filled 2023-12-18: qty 20

## 2023-12-18 MED ORDER — DIPHENHYDRAMINE HCL 25 MG PO CAPS: 50.0000 mg | ORAL_CAPSULE | Freq: Once | ORAL | Status: DC

## 2023-12-18 MED ORDER — SODIUM CHLORIDE 0.9 % IV SOLN: INTRAVENOUS | Status: DC

## 2023-12-18 MED ORDER — ACETAMINOPHEN 500 MG PO TABS: 500.0000 mg | ORAL_TABLET | Freq: Once | ORAL | Status: DC

## 2023-12-19 ENCOUNTER — Encounter: Payer: Self-pay | Admitting: Obstetrics and Gynecology

## 2023-12-19 ENCOUNTER — Ambulatory Visit: Admitting: Obstetrics and Gynecology

## 2023-12-19 VITALS — BP 109/60 | HR 67

## 2023-12-19 DIAGNOSIS — N812 Incomplete uterovaginal prolapse: Secondary | ICD-10-CM | POA: Diagnosis not present

## 2023-12-19 DIAGNOSIS — N814 Uterovaginal prolapse, unspecified: Secondary | ICD-10-CM

## 2023-12-19 DIAGNOSIS — N8110 Cystocele, unspecified: Secondary | ICD-10-CM

## 2023-12-19 NOTE — Progress Notes (Signed)
 [facility]   Subjective:     Chief Complaint: New Patient (Initial Visit) Cassandra Herring Cassandra Herring is a 78 y.o. female here today )  History of Present Illness: Cassandra Herring is a 78 y.o. female with stage II pelvic organ prolapse who presents today for a pessary fitting. She had been using the #2 RWS pessary but the pessary was not as supportive as her cup and her bladder was coming around it. She was originally fitted in our office for a #2 Cup and was awaiting this to come in but has been trying the #2 RWS in the meantime. She presents today for the cup to be fit.    Past Medical History: Patient  has a past medical history of Anemia, Basal cell carcinoma, Bursitis, Crohn disease (HCC), HNP (herniated nucleus pulposus), Mass, Osteopenia, Partial tear of left subscapularis tendon, Raynaud's disease, STD (sexually transmitted disease), Tendon tear, Thyroid  nodule, Vertigo, and Vitamin D deficiency.   Past Surgical History: She  has a past surgical history that includes Breast surgery and Mohs surgery (Left, 10/27/2020).   Medications: She has a current medication list which includes the following prescription(s): acetaminophen , alclomethasone, vitamin c, boron, calcium, cetirizine hcl, copper, diphenhydramine , estradiol , fexofenadine, infliximab , lorazepam, lysine, magnesium, manganese, NON FORMULARY, omega-3 fatty acids, OVER THE COUNTER MEDICATION, OVER THE COUNTER MEDICATION, prednisone , tacrolimus, triamcinolone  cream, UNABLE TO FIND, valacyclovir , vitamin d, vitamin k, and zinc sulfate.   Allergies: Patient is allergic to humira [adalimumab], diphenhydramine , doxepin hcl, other, solu-medrol  [methylprednisolone  sodium succ], surgilube [k-y lubricating], methylprednisolone , remicade  [infliximab ], and wound dressing adhesive.   Social History: Patient  reports that she has never smoked. She has never used smokeless tobacco. She reports that she does not drink alcohol  and does not use drugs.      Objective:    BP 109/60   Pulse 67   LMP 01/29/1999 Comment: sexually active Gen: No apparent distress, A&O x 3. Pelvic Exam: Normal external female genitalia; Bartholin's and Skene's glands normal in appearance; urethral meatus normal in appearance, no urethral masses or discharge.   A size #2 Cup (Lot [id number]) pessary was fitted. It was comfortable, stayed in place with valsalva and was an appropriate size on examination, with one finger fitting between the pessary and the vaginal walls. We tied a string to it and the patient demonstrated proper removal and replacement.    Assessment/Plan:    Assessment: [name] is a 78 y.o. with stage II pelvic organ prolapse who presents for a pessary fitting. Plan: She was fitted with a #2 cup pessary. She will remove weekly. She will use lubricant.   Follow-up in 3 months for a pessary check or sooner as needed.  All questions were answered.    Cassandra Nack G Johari Bennetts, NP

## 2023-12-28 DIAGNOSIS — E7800 Pure hypercholesterolemia, unspecified: Secondary | ICD-10-CM | POA: Diagnosis not present

## 2023-12-30 DIAGNOSIS — M8589 Other specified disorders of bone density and structure, multiple sites: Secondary | ICD-10-CM | POA: Diagnosis not present

## 2024-01-07 ENCOUNTER — Encounter: Payer: Self-pay | Admitting: Obstetrics and Gynecology

## 2024-01-15 ENCOUNTER — Encounter: Payer: Self-pay | Admitting: Obstetrics and Gynecology

## 2024-02-04 ENCOUNTER — Encounter (HOSPITAL_COMMUNITY): Payer: Self-pay | Admitting: Gastroenterology

## 2024-02-05 ENCOUNTER — Encounter (HOSPITAL_COMMUNITY): Payer: Self-pay | Admitting: Gastroenterology

## 2024-02-06 ENCOUNTER — Other Ambulatory Visit (HOSPITAL_COMMUNITY): Payer: Self-pay | Admitting: Sports Medicine

## 2024-02-06 ENCOUNTER — Telehealth (HOSPITAL_COMMUNITY): Payer: Self-pay | Admitting: Pharmacy Technician

## 2024-02-06 DIAGNOSIS — M810 Age-related osteoporosis without current pathological fracture: Secondary | ICD-10-CM | POA: Insufficient documentation

## 2024-02-06 NOTE — Telephone Encounter (Signed)
 Auth Submission: NO AUTH NEEDED Site of care: [facility] Payer: MEDICARE A/B, AETNA SUPP Medication & CPT/J Code(s) submitted: Reclast (Zolendronic acid) J3489 Diagnosis Code: M81.0 Route of submission (phone, fax, portal):  Phone # Fax # Auth type: Buy/Bill HB Units/visits requested: 5MG  X 1 DOSE, EVERY 12 MONTHS Reference number:  Approval from: 02/06/2024 to 05/06/24    Dagoberto Armour, CPhT [facility] Phone: [phone] 02/06/2024

## 2024-02-06 NOTE — Progress Notes (Signed)
 Received Reclast referral. Calcium and renal function wnl on 02/05/2024  Per OV note, patient wants to complete Reclast 4 weeks after Remicade  (in between Remicade  doses) due to possible post-infusion symptoms with Remicade   Seanne Chirico, PharmD, MPH, BCPS, CPP Clinical Pharmacist

## 2024-02-06 NOTE — Telephone Encounter (Signed)
 Auth Submission: NO AUTH NEEDED Site of care: [facility] Payer: Medicare A/B, AETNA Supp   Medication & CPT/J Code(s) submitted: Remicade  (Infliximab ) J1745 Diagnosis Code: K50.919 Route of submission (phone, fax, portal):  Phone # Fax # Auth type: Buy/Bill HB Units/visits requested: 200MG  EVERY 8 WEEKS Reference number:  Approval from: 01/29/24 to 01/27/25    Dagoberto Armour, CPhT [facility] Phone: [phone] 02/06/2024

## 2024-02-10 ENCOUNTER — Other Ambulatory Visit (HOSPITAL_COMMUNITY): Payer: Self-pay

## 2024-02-11 ENCOUNTER — Ambulatory Visit (HOSPITAL_COMMUNITY)
Admission: RE | Admit: 2024-02-11 | Discharge: 2024-02-11 | Disposition: A | Discharge status: Home / Self Care | Source: Ambulatory Visit | Attending: Gastroenterology | Admitting: Gastroenterology

## 2024-02-11 VITALS — BP 133/72 | HR 90 | Temp 97.6°F | Resp 15 | Wt 110.0 lb

## 2024-02-11 DIAGNOSIS — K50919 Crohn's disease, unspecified, with unspecified complications: Secondary | ICD-10-CM | POA: Diagnosis present

## 2024-02-11 MED ORDER — ACETAMINOPHEN 500 MG PO TABS: 500.0000 mg | ORAL_TABLET | Freq: Once | ORAL | Status: DC

## 2024-02-11 MED ORDER — SODIUM CHLORIDE 0.9 % IV SOLN
200.0000 mg | Freq: Once | INTRAVENOUS | Status: AC
  Administered 2024-02-11: 200 mg via INTRAVENOUS
  Filled 2024-02-11: qty 20

## 2024-02-11 MED ORDER — PREDNISONE 20 MG PO TABS: 20.0000 mg | ORAL_TABLET | Freq: Once | ORAL | Status: DC

## 2024-02-11 MED ORDER — SODIUM CHLORIDE 0.9 % IV SOLN: INTRAVENOUS | Status: DC

## 2024-02-11 MED ORDER — DIPHENHYDRAMINE HCL 25 MG PO CAPS: 50.0000 mg | ORAL_CAPSULE | Freq: Once | ORAL | Status: DC

## 2024-02-19 ENCOUNTER — Ambulatory Visit (HOSPITAL_COMMUNITY)

## 2024-02-28 ENCOUNTER — Encounter: Payer: Self-pay | Admitting: Obstetrics and Gynecology

## 2024-02-28 NOTE — Progress Notes (Unsigned)
"  Pt called answering service. She removed her pessary and noted some blood. She looked and her vaginal wall was hanging down and she noticed a spot of blood on it. It expanded to about a dime sized amount. She then laid down. While on the phone, she looked and noted the bleeding had stopped but she still sees the spot. Advised to keep the pessary out for 2-3 days and to use estrogen cream daily during this time. She will then replace the pessary and resume estrogen cream twice weekly.   Her next appt is not until March. Advised her to call the office at the beginning of the week and see if her appt can be moved up to be seen in the next few weeks. "

## 2024-03-02 ENCOUNTER — Other Ambulatory Visit (HOSPITAL_COMMUNITY): Payer: Self-pay | Admitting: Sports Medicine

## 2024-03-03 NOTE — Addendum Note (Signed)
 Addended by: DAYNE SHERRY RAMAN on: 03/03/2024 03:20 PM   Modules accepted: Orders

## 2024-03-03 NOTE — Progress Notes (Signed)
 Received another order from Dr. Orpha requesting pre-hydration in addition to slower infusion for Reclast  Ordered 500mL 0.9% NS as pre-hydration  Sherry Pennant, PharmD, MPH, BCPS, CPP Clinical Pharmacist

## 2024-03-03 NOTE — Progress Notes (Unsigned)
 [facility] Return Visit  SUBJECTIVE  History of Present Illness: Cassandra Herring is a 79 y.o. female seen in follow-up for stage II pelvic organ prolapse and urgency urinary incontinence. Plan at last visit was low dose vaginal estrogen and trial of size 2 cup pessary.   Called answering service 02/28/24 due to dime size amount of vaginal bleeding noted at the time of pessary removal.  Bleeding stopped after laying down in supine position. Denies bleeding since.  Noted superficial skin separation Increased vaginal estrogen nightly for 3 nights and left pessary out for 3 days prior to replacement of pessary.  Resumed self management of #2 cup pessary, reports overflow of bladder tissue past pessary Failed #2 ring with support, #2 Incontinence dish (discomfort), #1 Cup (movement), #3 Cup with support (unable to empty bladder)  History of Crohn's disease  Urodynamic Impression 10/08/23:  1. Sensation was increased; capacity was normal 2. Stress Incontinence was demonstrated at normal pressures; 3. Detrusor Overactivity was demonstrated without leakage. 4. Emptying was dysfunctional with a normal PVR, a sustained detrusor contraction present,  abdominal straining not present, dyssynergic urethral sphincter activity on EMG.   Past Medical History: Patient  has a past medical history of Anemia, Basal cell carcinoma, Bursitis, Crohn disease (HCC), HNP (herniated nucleus pulposus), Mass, Osteopenia, Partial tear of left subscapularis tendon, Raynaud's disease, STD (sexually transmitted disease), Tendon tear, Thyroid  nodule, Vertigo, and Vitamin D deficiency.   Past Surgical History: She  has a past surgical history that includes Breast surgery and Mohs surgery (Left, 10/27/2020).   Medications: She has a current medication list which includes the following prescription(s): acetaminophen , alclomethasone, vitamin c, boron, calcium, cetirizine hcl, copper, diphenhydramine ,  estradiol , fexofenadine, infliximab , lorazepam, lysine, magnesium, manganese, NON FORMULARY, omega-3 fatty acids, OVER THE COUNTER MEDICATION, OVER THE COUNTER MEDICATION, prednisone , tacrolimus, triamcinolone  cream, UNABLE TO FIND, valacyclovir , vitamin d, vitamin k, and zinc sulfate.   Allergies: Patient is allergic to humira [adalimumab], diphenhydramine , doxepin hcl, other, solu-medrol  [methylprednisolone  sodium succ], surgilube [k-y lubricating], methylprednisolone , remicade  [infliximab ], and wound dressing adhesive.   Social History: Patient  reports that she has never smoked. She has never used smokeless tobacco. She reports that she does not drink alcohol and does not use drugs.     OBJECTIVE     Physical Exam: Vitals:   03/04/24 1327  BP: 127/60  Pulse: 72   Gen: No apparent distress, A&O x 3.  Pelvic Exam: Normal external female genitalia; Bartholin's and Skene's glands normal in appearance; urethral meatus normal in appearance, no urethral masses or discharge.   A size 3 Shaatz pessary was fitted. It was comfortable, stayed in place with valsalva and was an appropriate size on examination, with one finger fitting between the pessary and the vaginal walls. Patient demonstrated proper removal and replacement.  LOT [id number]      ASSESSMENT AND PLAN    [name] is a 79 y.o. with:  1. Encounter for fitting and adjustment of pessary   2. Incontinence in female   3. Pelvic organ prolapse quantification stage 2 cystocele   4. Vaginal atrophy     Encounter for fitting and adjustment of pessary Assessment & Plan: - discussed risk of change in urinary or bowel symptoms, vaginal ulceration, discharge, bleeding, fistula formation. Explained that pt may require multiple sizes and types for fitting.  - Failed #2 ring with support, #2 Incontinence dish (discomfort), #1 Cup (movement), #3 Cup with support (unable to empty bladder)  - able to  self manage #3 cup pessary, however  reports bulge symptoms past pessary - trial of size 3 shaatz pessary, encouraged self management. Replace size 2 cup pessary if she prefers over shaatz pessary. Can try size 4 shaatz pessary if no change in urinary symptoms or discomfort - continue low dose vaginal estrogen   Incontinence in female Assessment & Plan: - UDS 10/08/23 with increased sensation, SUI, DO, and dyssynergic EMG. - prior #2 Incontinence dish pessary use with discomfort - encouraged to increase low dose vaginal estrogen to 1g 3x/week   Pelvic organ prolapse quantification stage 2 cystocele Assessment & Plan: - For treatment of pelvic organ prolapse, we discussed options for management including expectant management, conservative management, and surgical management, such as Kegels, a pessary, pelvic floor physical therapy, and specific surgical procedures. - trial of size 3 shaatz pessary, able to self manage   Vaginal atrophy Assessment & Plan: - For symptomatic vaginal atrophy options include lubrication with a water-based lubricant, personal hygiene measures and barrier protection against wetness, and estrogen replacement in the form of vaginal cream, vaginal tablets, or a time-released vaginal ring.   - increase low dose vaginal estrogen due to recent episode of vaginal bleeding at the time of pessary removal attributed to vulvar irritation from fingernails   Time spent: I spent 20 minutes dedicated to the care of this patient on the date of this encounter to include pre-visit review of records, face-to-face time with the patient discussing stage II pelvic organ prolapse, urinary incontinence, vaginal atrophy, pessary fitting, and post visit documentation and ordering medication/ testing.    Lianne ONEIDA Gillis, MD

## 2024-03-04 ENCOUNTER — Encounter: Payer: Self-pay | Admitting: Obstetrics

## 2024-03-04 ENCOUNTER — Ambulatory Visit: Admitting: Obstetrics

## 2024-03-04 VITALS — BP 127/60 | HR 72

## 2024-03-04 DIAGNOSIS — Z4689 Encounter for fitting and adjustment of other specified devices: Secondary | ICD-10-CM

## 2024-03-04 DIAGNOSIS — N8110 Cystocele, unspecified: Secondary | ICD-10-CM

## 2024-03-04 DIAGNOSIS — R32 Unspecified urinary incontinence: Secondary | ICD-10-CM

## 2024-03-04 DIAGNOSIS — N952 Postmenopausal atrophic vaginitis: Secondary | ICD-10-CM | POA: Insufficient documentation

## 2024-03-04 DIAGNOSIS — N814 Uterovaginal prolapse, unspecified: Secondary | ICD-10-CM | POA: Insufficient documentation

## 2024-03-04 NOTE — Patient Instructions (Signed)
 You are opting to try a size 3 shaatz pessary. This will keep the bulge inside and prevent it from getting worse. You can learn how to remove it yourself or we can do that for you every 3 months.   Increase vaginal estrogen 1g 3 times a week  - discussed proper vulvar care, warm compression, avoid pad use, cotton only underwear and barrier ointment if needed

## 2024-03-04 NOTE — Assessment & Plan Note (Signed)
-   For symptomatic vaginal atrophy options include lubrication with a water-based lubricant, personal hygiene measures and barrier protection against wetness, and estrogen replacement in the form of vaginal cream, vaginal tablets, or a time-released vaginal ring.   - increase low dose vaginal estrogen due to recent episode of vaginal bleeding at the time of pessary removal attributed to vulvar irritation from fingernails

## 2024-03-04 NOTE — Assessment & Plan Note (Addendum)
-   For treatment of pelvic organ prolapse, we discussed options for management including expectant management, conservative management, and surgical management, such as Kegels, a pessary, pelvic floor physical therapy, and specific surgical procedures. - trial of size 3 shaatz pessary, able to self manage

## 2024-03-04 NOTE — Assessment & Plan Note (Addendum)
-   UDS 10/08/23 with increased sensation, SUI, DO, and dyssynergic EMG. - prior #2 Incontinence dish pessary use with discomfort - encouraged to increase low dose vaginal estrogen to 1g 3x/week

## 2024-03-04 NOTE — Assessment & Plan Note (Addendum)
-   discussed risk of change in urinary or bowel symptoms, vaginal ulceration, discharge, bleeding, fistula formation. Explained that pt may require multiple sizes and types for fitting.  - Failed #2 ring with support, #2 Incontinence dish (discomfort), #1 Cup (movement), #3 Cup with support (unable to empty bladder)  - able to self manage #3 cup pessary, however reports bulge symptoms past pessary - trial of size 3 shaatz pessary, encouraged self management. Replace size 2 cup pessary if she prefers over shaatz pessary. Can try size 4 shaatz pessary if no change in urinary symptoms or discomfort - continue low dose vaginal estrogen

## 2024-03-05 ENCOUNTER — Encounter: Payer: Self-pay | Admitting: Obstetrics

## 2024-03-05 ENCOUNTER — Ambulatory Visit: Admitting: Obstetrics and Gynecology

## 2024-03-08 ENCOUNTER — Inpatient Hospital Stay (HOSPITAL_COMMUNITY): Admission: RE | Admit: 2024-03-08 | Source: Ambulatory Visit

## 2024-04-19 ENCOUNTER — Ambulatory Visit: Admitting: Obstetrics
